# Patient Record
Sex: Female | Born: 1960
Health system: Southern US, Community
[De-identification: ages and names within clinical notes are randomized; demographics above are authoritative.]

## PROBLEM LIST (undated history)

## (undated) ENCOUNTER — Emergency Department (HOSPITAL_COMMUNITY): Payer: Self-pay | Source: Home / Self Care

## (undated) DIAGNOSIS — G629 Polyneuropathy, unspecified: Secondary | ICD-10-CM

## (undated) DIAGNOSIS — C539 Malignant neoplasm of cervix uteri, unspecified: Secondary | ICD-10-CM

## (undated) DIAGNOSIS — F419 Anxiety disorder, unspecified: Secondary | ICD-10-CM

## (undated) DIAGNOSIS — J209 Acute bronchitis, unspecified: Secondary | ICD-10-CM

## (undated) DIAGNOSIS — Z8739 Personal history of other diseases of the musculoskeletal system and connective tissue: Secondary | ICD-10-CM

## (undated) DIAGNOSIS — K8020 Calculus of gallbladder without cholecystitis without obstruction: Secondary | ICD-10-CM

## (undated) DIAGNOSIS — M1990 Unspecified osteoarthritis, unspecified site: Secondary | ICD-10-CM

## (undated) DIAGNOSIS — H269 Unspecified cataract: Secondary | ICD-10-CM

## (undated) DIAGNOSIS — D649 Anemia, unspecified: Secondary | ICD-10-CM

## (undated) DIAGNOSIS — T7840XA Allergy, unspecified, initial encounter: Secondary | ICD-10-CM

## (undated) DIAGNOSIS — I10 Essential (primary) hypertension: Secondary | ICD-10-CM

## (undated) HISTORY — PX: POLYPECTOMY: SHX149

## (undated) HISTORY — DX: Anemia, unspecified: D64.9

## (undated) HISTORY — PX: IR FALLOPIAN TUBE CATHETERIZATION: IMG633

## (undated) HISTORY — DX: Unspecified osteoarthritis, unspecified site: M19.90

## (undated) HISTORY — DX: Polyneuropathy, unspecified: G62.9

## (undated) HISTORY — DX: Acute bronchitis, unspecified: J20.9

## (undated) HISTORY — DX: Allergy, unspecified, initial encounter: T78.40XA

## (undated) HISTORY — DX: Anxiety disorder, unspecified: F41.9

## (undated) HISTORY — PX: COLONOSCOPY: SHX174

## (undated) HISTORY — PX: BREAST EXCISIONAL BIOPSY: SUR124

## (undated) HISTORY — DX: Unspecified cataract: H26.9

---

## 1997-08-09 ENCOUNTER — Emergency Department (HOSPITAL_COMMUNITY): Admission: EM | Admit: 1997-08-09 | Discharge: 1997-08-09 | Payer: Self-pay | Admitting: Emergency Medicine

## 1997-11-17 ENCOUNTER — Emergency Department (HOSPITAL_COMMUNITY): Admission: EM | Admit: 1997-11-17 | Discharge: 1997-11-17 | Payer: Self-pay | Admitting: Emergency Medicine

## 2003-06-28 ENCOUNTER — Emergency Department (HOSPITAL_COMMUNITY): Admission: AD | Admit: 2003-06-28 | Discharge: 2003-06-28 | Payer: Self-pay | Admitting: Family Medicine

## 2004-12-02 ENCOUNTER — Emergency Department (HOSPITAL_COMMUNITY): Admission: EM | Admit: 2004-12-02 | Discharge: 2004-12-02 | Payer: Self-pay | Admitting: Emergency Medicine

## 2005-02-17 ENCOUNTER — Emergency Department (HOSPITAL_COMMUNITY): Admission: EM | Admit: 2005-02-17 | Discharge: 2005-02-17 | Payer: Self-pay | Admitting: Family Medicine

## 2006-10-06 ENCOUNTER — Emergency Department (HOSPITAL_COMMUNITY): Admission: EM | Admit: 2006-10-06 | Discharge: 2006-10-06 | Payer: Self-pay | Admitting: Emergency Medicine

## 2006-11-07 ENCOUNTER — Ambulatory Visit: Payer: Self-pay | Admitting: Internal Medicine

## 2006-11-08 ENCOUNTER — Ambulatory Visit: Payer: Self-pay | Admitting: *Deleted

## 2007-01-05 ENCOUNTER — Emergency Department (HOSPITAL_COMMUNITY): Admission: EM | Admit: 2007-01-05 | Discharge: 2007-01-05 | Payer: Self-pay | Admitting: Family Medicine

## 2008-05-07 ENCOUNTER — Emergency Department (HOSPITAL_COMMUNITY): Admission: EM | Admit: 2008-05-07 | Discharge: 2008-05-07 | Payer: Self-pay | Admitting: Family Medicine

## 2010-04-04 ENCOUNTER — Emergency Department (HOSPITAL_COMMUNITY)
Admission: EM | Admit: 2010-04-04 | Discharge: 2010-04-04 | Payer: Self-pay | Source: Home / Self Care | Admitting: Emergency Medicine

## 2010-12-19 ENCOUNTER — Inpatient Hospital Stay (INDEPENDENT_AMBULATORY_CARE_PROVIDER_SITE_OTHER)
Admission: RE | Admit: 2010-12-19 | Discharge: 2010-12-19 | Disposition: A | Discharge status: Home / Self Care | Payer: Self-pay | Source: Ambulatory Visit | Attending: Family Medicine | Admitting: Family Medicine

## 2010-12-19 DIAGNOSIS — L03019 Cellulitis of unspecified finger: Secondary | ICD-10-CM

## 2010-12-23 LAB — CULTURE, ROUTINE-ABSCESS: Gram Stain: NONE SEEN

## 2011-03-11 ENCOUNTER — Emergency Department (HOSPITAL_COMMUNITY)
Admission: EM | Admit: 2011-03-11 | Discharge: 2011-03-11 | Disposition: A | Discharge status: Home / Self Care | Payer: Self-pay | Attending: Emergency Medicine | Admitting: Emergency Medicine

## 2011-03-11 ENCOUNTER — Encounter: Payer: Self-pay | Admitting: *Deleted

## 2011-03-11 ENCOUNTER — Emergency Department (INDEPENDENT_AMBULATORY_CARE_PROVIDER_SITE_OTHER)
Admission: EM | Admit: 2011-03-11 | Discharge: 2011-03-11 | Disposition: A | Discharge status: Home / Self Care | Payer: Self-pay | Source: Home / Self Care | Attending: Emergency Medicine | Admitting: Emergency Medicine

## 2011-03-11 DIAGNOSIS — F17200 Nicotine dependence, unspecified, uncomplicated: Secondary | ICD-10-CM

## 2011-03-11 DIAGNOSIS — R21 Rash and other nonspecific skin eruption: Secondary | ICD-10-CM | POA: Insufficient documentation

## 2011-03-11 DIAGNOSIS — M25519 Pain in unspecified shoulder: Secondary | ICD-10-CM | POA: Insufficient documentation

## 2011-03-11 DIAGNOSIS — F17210 Nicotine dependence, cigarettes, uncomplicated: Secondary | ICD-10-CM

## 2011-03-11 DIAGNOSIS — Z5320 Procedure and treatment not carried out because of patient's decision for unspecified reasons: Secondary | ICD-10-CM | POA: Insufficient documentation

## 2011-03-11 DIAGNOSIS — L259 Unspecified contact dermatitis, unspecified cause: Secondary | ICD-10-CM

## 2011-03-11 MED ORDER — TRIAMCINOLONE ACETONIDE 0.1 % EX CREA: TOPICAL_CREAM | Freq: Three times a day (TID) | CUTANEOUS | Status: DC

## 2011-03-11 MED ORDER — PREDNISONE 10 MG PO TABS: ORAL_TABLET | ORAL | Status: AC

## 2011-03-11 MED ORDER — METHYLPREDNISOLONE ACETATE PF 80 MG/ML IJ SUSP: 80.0000 mg | Freq: Once | INTRAMUSCULAR | Status: DC

## 2011-03-11 MED ORDER — CEPHALEXIN 500 MG PO CAPS: 500.0000 mg | ORAL_CAPSULE | Freq: Three times a day (TID) | ORAL | Status: AC

## 2011-03-11 NOTE — ED Notes (Signed)
Bumpy, pruritic rash to neck x 4 days w/ progression down upper back; has been applying cool cloths.  C/O gradual onset right shoulder pain over past 3 days; packs boxes for a living.  Also c/o jamming left middle finger 3-4 days ago; continues w/ pain and swelling.  Has been taking IBU for pain.

## 2011-03-11 NOTE — ED Provider Notes (Signed)
History     CSN: [id number] Arrival date & time: 03/11/2011  6:41 PM   First MD Initiated Contact with Patient 03/11/11 1903      Chief Complaint  Patient presents with  . Rash    Right shoulder pain; left middle finger pain    (Consider location/radiation/quality/duration/timing/severity/associated sxs/prior treatment) HPI Comments: Michelle Williams is a 50 year old female who has had a five-day history of a pruritic rash on her neck. She thinks this might be due to exposure to a new ink at work. She works at [employer], making labels for bottles. She thinks it might in her fingernails and then transferred to her neck. The rash itches and burns. She has no rash anywhere else. She denies any fever, chills, nasal congestion, sore throat, or difficulty with breathing.  She is otherwise in good health. Her only medication is ibuprofen. She has no medication allergies. She is postmenopausal. She does not have a primary care Dr. She smokes 3-5 cigarettes per day.  Patient is a 50 y.o. female presenting with rash.  Rash     History reviewed. No pertinent past medical history.  History reviewed. No pertinent past surgical history.  History reviewed. No pertinent family history.  History  Substance Use Topics  . Smoking status: Current Everyday Smoker -- 1.0 packs/day    Types: Cigarettes  . Smokeless tobacco: Not on file  . Alcohol Use: No    OB History    Grav Para Term Preterm Abortions TAB SAB Ect Mult Living                  Review of Systems  Constitutional: Negative for fever and chills.  Skin: Positive for rash. Negative for color change, pallor and wound.    Allergies  Review of patient's allergies indicates no known allergies.  Home Medications   Current Outpatient Rx  Name Route Sig Dispense Refill  . CEPHALEXIN 500 MG PO CAPS Oral Take 1 capsule (500 mg total) by mouth 3 (three) times daily. 30 capsule 0  . PREDNISONE 10 MG PO TABS  Take 4 tabs daily for 4  days, 3 tabs daily for 4 days, 2 tabs daily for 4 days, then 1 tab daily for 4 days.  Take all tabs at one time with food and preferably in the morning except for the first dose. 40 tablet 0  . TRIAMCINOLONE ACETONIDE 0.1 % EX CREA Topical Apply topically 3 (three) times daily. 30 g 0    BP 134/86  Pulse 69  Temp(Src) 98.7 F (37.1 C) (Oral)  Resp 18  SpO2 100%  Physical Exam  Nursing note and vitals reviewed. Constitutional: She appears well-developed and well-nourished. No distress.  Skin: Skin is warm and dry. Rash noted. No abrasion, no bruising, no ecchymosis and no lesion noted. She is not diaphoretic. No erythema. No pallor.       She has a rash on her neck. Some of this looks hyperpigmented and lichenified, especially in the posterior neck, as if it might be a chronic rash due to eczema. In addition to this he has patches and streaks of pustules on both sides of the neck. Some of these are linear. It looks like a contact dermatitis but might be secondarily infected.    ED Course  Procedures (including critical care time)  Labs Reviewed - No data to display No results found.   1. Contact dermatitis   2. Cigarette smoker       MDM  This appears to  be a contact dermatitis possibly with some secondary infection.        Roque Lias, MD 03/11/11 2000

## 2012-02-02 ENCOUNTER — Emergency Department (HOSPITAL_COMMUNITY): Payer: Self-pay

## 2012-02-02 ENCOUNTER — Emergency Department (HOSPITAL_COMMUNITY)
Admission: EM | Admit: 2012-02-02 | Discharge: 2012-02-02 | Disposition: A | Discharge status: Home / Self Care | Payer: Self-pay | Attending: Emergency Medicine | Admitting: Emergency Medicine

## 2012-02-02 ENCOUNTER — Encounter (HOSPITAL_COMMUNITY): Payer: Self-pay | Admitting: *Deleted

## 2012-02-02 DIAGNOSIS — F17200 Nicotine dependence, unspecified, uncomplicated: Secondary | ICD-10-CM | POA: Insufficient documentation

## 2012-02-02 DIAGNOSIS — S52599A Other fractures of lower end of unspecified radius, initial encounter for closed fracture: Secondary | ICD-10-CM | POA: Insufficient documentation

## 2012-02-02 DIAGNOSIS — F411 Generalized anxiety disorder: Secondary | ICD-10-CM | POA: Insufficient documentation

## 2012-02-02 DIAGNOSIS — S52501A Unspecified fracture of the lower end of right radius, initial encounter for closed fracture: Secondary | ICD-10-CM

## 2012-02-02 DIAGNOSIS — R209 Unspecified disturbances of skin sensation: Secondary | ICD-10-CM | POA: Insufficient documentation

## 2012-02-02 MED ORDER — LORAZEPAM 2 MG/ML IJ SOLN
1.0000 mg | Freq: Once | INTRAMUSCULAR | Status: AC
  Administered 2012-02-02: 1 mg via INTRAVENOUS
  Filled 2012-02-02: qty 1

## 2012-02-02 MED ORDER — HYDROMORPHONE HCL PF 1 MG/ML IJ SOLN
1.0000 mg | Freq: Once | INTRAMUSCULAR | Status: AC
  Administered 2012-02-02: 1 mg via INTRAVENOUS
  Filled 2012-02-02: qty 1

## 2012-02-02 NOTE — Progress Notes (Signed)
Orthopedic Tech Progress Note Patient Details:  Michelle Williams July 07, 1960 [id number]  Ortho Devices Type of Ortho Device: Sugartong splint;Arm foam sling Ortho Device/Splint Location: right arm Ortho Device/Splint Interventions: Application   Poonam Woehrle 02/02/2012, 9:15 PM

## 2012-02-02 NOTE — Progress Notes (Signed)
Orthopedic Tech Progress Note Patient Details:  Michelle Williams 1961/03/28 [id number]  Patient ID: [name], female   DOB: 1960-09-25, 51 y.o.   MRN: [id number]   [name] 02/02/2012, 9:15 PM
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
## 2012-02-02 NOTE — ED Notes (Signed)
Paged ortho tech for placement of Splint
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
## 2012-02-02 NOTE — ED Notes (Signed)
Orthopedic Surgeon at bedside for reduction of right splint. Ortho tech at bedside with MD

## 2012-02-02 NOTE — ED Provider Notes (Signed)
Medical screening examination/treatment/procedure(s) were performed by non-physician practitioner and as supervising physician I was immediately available for consultation/collaboration.   David H Yao, MD 02/02/12 2324 

## 2012-08-01 ENCOUNTER — Emergency Department (HOSPITAL_COMMUNITY)
Admission: EM | Admit: 2012-08-01 | Discharge: 2012-08-01 | Disposition: A | Discharge status: Home / Self Care | Payer: Self-pay | Attending: Emergency Medicine | Admitting: Emergency Medicine

## 2012-08-01 ENCOUNTER — Emergency Department (HOSPITAL_COMMUNITY): Payer: Self-pay

## 2012-08-01 ENCOUNTER — Encounter (HOSPITAL_COMMUNITY): Payer: Self-pay | Admitting: Emergency Medicine

## 2012-08-01 DIAGNOSIS — F17200 Nicotine dependence, unspecified, uncomplicated: Secondary | ICD-10-CM | POA: Insufficient documentation

## 2012-08-01 DIAGNOSIS — Z3202 Encounter for pregnancy test, result negative: Secondary | ICD-10-CM | POA: Insufficient documentation

## 2012-08-01 DIAGNOSIS — R109 Unspecified abdominal pain: Secondary | ICD-10-CM | POA: Insufficient documentation

## 2012-08-01 DIAGNOSIS — R42 Dizziness and giddiness: Secondary | ICD-10-CM | POA: Insufficient documentation

## 2012-08-01 LAB — URINALYSIS, ROUTINE W REFLEX MICROSCOPIC
Bilirubin Urine: NEGATIVE
Glucose, UA: NEGATIVE mg/dL
Specific Gravity, Urine: 1.016 (ref 1.005–1.030)
Urobilinogen, UA: 1 mg/dL (ref 0.0–1.0)

## 2012-08-01 LAB — COMPREHENSIVE METABOLIC PANEL
BUN: 8 mg/dL (ref 6–23)
Calcium: 9.4 mg/dL (ref 8.4–10.5)
Chloride: 103 mEq/L (ref 96–112)
Creatinine, Ser: 0.81 mg/dL (ref 0.50–1.10)
Glucose, Bld: 119 mg/dL — ABNORMAL HIGH (ref 70–99)
Potassium: 3.7 mEq/L (ref 3.5–5.1)
Sodium: 138 mEq/L (ref 135–145)
Total Bilirubin: 0.5 mg/dL (ref 0.3–1.2)
Total Protein: 7.9 g/dL (ref 6.0–8.3)

## 2012-08-01 LAB — CBC WITH DIFFERENTIAL/PLATELET
Basophils Absolute: 0 10*3/uL (ref 0.0–0.1)
Eosinophils Relative: 0 % (ref 0–5)
HCT: 40 % (ref 36.0–46.0)
Hemoglobin: 13.6 g/dL (ref 12.0–15.0)
Lymphs Abs: 1.2 10*3/uL (ref 0.7–4.0)
MCH: 31.1 pg (ref 26.0–34.0)
MCV: 91.3 fL (ref 78.0–100.0)
Platelets: 212 10*3/uL (ref 150–400)
WBC: 12.9 10*3/uL — ABNORMAL HIGH (ref 4.0–10.5)

## 2012-08-01 MED ORDER — LEVOFLOXACIN 500 MG PO TABS
500.0000 mg | ORAL_TABLET | Freq: Every day | ORAL | Status: DC
  Administered 2012-08-01: 500 mg via ORAL
  Filled 2012-08-01: qty 1

## 2012-08-01 MED ORDER — SODIUM CHLORIDE 0.9 % IV SOLN
1000.0000 mL | Freq: Once | INTRAVENOUS | Status: AC
  Administered 2012-08-01: 1000 mL via INTRAVENOUS

## 2012-08-01 MED ORDER — IOHEXOL 300 MG/ML  SOLN
100.0000 mL | Freq: Once | INTRAMUSCULAR | Status: AC | PRN
  Administered 2012-08-01: 100 mL via INTRAVENOUS

## 2012-08-01 MED ORDER — HYDROCODONE-ACETAMINOPHEN 5-325 MG PO TABS: 1.0000 | ORAL_TABLET | Freq: Four times a day (QID) | ORAL | Status: DC | PRN

## 2012-08-01 MED ORDER — HYDROMORPHONE HCL PF 1 MG/ML IJ SOLN
0.5000 mg | Freq: Once | INTRAMUSCULAR | Status: AC
  Administered 2012-08-01: 0.5 mg via INTRAVENOUS
  Filled 2012-08-01: qty 1

## 2012-08-01 MED ORDER — LEVOFLOXACIN 500 MG PO TABS: 500.0000 mg | ORAL_TABLET | Freq: Every day | ORAL | Status: DC

## 2012-08-01 MED ORDER — METRONIDAZOLE 500 MG PO TABS: 500.0000 mg | ORAL_TABLET | Freq: Three times a day (TID) | ORAL | Status: DC

## 2012-08-01 MED ORDER — IOHEXOL 300 MG/ML  SOLN
25.0000 mL | INTRAMUSCULAR | Status: AC
  Administered 2012-08-01: 25 mL via ORAL

## 2012-08-01 MED ORDER — ONDANSETRON HCL 4 MG/2ML IJ SOLN
4.0000 mg | Freq: Once | INTRAMUSCULAR | Status: AC
  Administered 2012-08-01: 4 mg via INTRAVENOUS
  Filled 2012-08-01: qty 2

## 2012-08-01 MED ORDER — DEXTROSE 5 % IV SOLN: 1.0000 g | Freq: Once | INTRAVENOUS | Status: DC

## 2012-08-01 MED ORDER — METRONIDAZOLE 500 MG PO TABS
500.0000 mg | ORAL_TABLET | Freq: Once | ORAL | Status: AC
  Administered 2012-08-01: 500 mg via ORAL
  Filled 2012-08-01: qty 1

## 2012-08-01 NOTE — ED Notes (Signed)
Pt unable to give urine sample for specimen at this time.

## 2012-08-01 NOTE — ED Notes (Signed)
Per pt, pt reporting pain in lower quadrants of abdomen. Pt states the pain started yesterday and woke her out of her sleep. Pt denies burning when urinating, denies urinary frequency, denies hematuria. Pt states she is having slight dizziness with symptoms.

## 2012-08-01 NOTE — ED Notes (Signed)
Pt complaining of mostly lower abdominal pain near bladder. Pt also reports lower back pain in the flank area.

## 2012-08-01 NOTE — ED Notes (Signed)
Patient given oral contrast to patient. Verbalized understanding

## 2012-08-01 NOTE — ED Notes (Signed)
EDP at bedside assessing pt.  

## 2012-08-01 NOTE — ED Provider Notes (Signed)
History     CSN: [id number]  Arrival date & time 08/01/12  0846   First MD Initiated Contact with Patient 08/01/12 [id number]      Chief Complaint  Patient presents with  . Abdominal Pain    (Consider location/radiation/quality/duration/timing/severity/associated sxs/prior treatment) HPI Patient presented with abdominal pain.  Pain began yesterday, without clear precipitant.  Since onset there's been pain throughout the lower abdomen, greater on the left.  Mild relief with ibuprofen, no clear exacerbating factors. There is no associated hematuria, dysuria, vaginal complaints. There is mild associated dizziness, but no syncope, no chest pain, no dyspnea. She denies a history of abdominal surgery, or any chronic conditions for which she takes medication.  History reviewed. No pertinent past medical history.  History reviewed. No pertinent past surgical history.  No family history on file.  History  Substance Use Topics  . Smoking status: Current Every Day Smoker -- 1.00 packs/day    Types: Cigarettes  . Smokeless tobacco: Not on file  . Alcohol Use: Yes    OB History   Grav Para Term Preterm Abortions TAB SAB Ect Mult Living                  Review of Systems  Constitutional:       Per HPI, otherwise negative  HENT:       Per HPI, otherwise negative  Respiratory:       Per HPI, otherwise negative  Cardiovascular:       Per HPI, otherwise negative  Gastrointestinal: Positive for abdominal pain. Negative for vomiting and abdominal distention.  Endocrine:       Negative aside from HPI  Genitourinary:       Neg aside from HPI   Musculoskeletal:       Per HPI, otherwise negative  Skin: Negative.   Neurological: Positive for light-headedness. Negative for syncope, weakness and headaches.    Allergies  Review of patient's allergies indicates no known allergies.  Home Medications   Current Outpatient Rx  Name  Route  Sig  Dispense  Refill  . ibuprofen (ADVIL,MOTRIN)  800 MG tablet   Oral   Take 800 mg by mouth every 8 (eight) hours as needed for pain.           BP 122/89  Pulse 96  Temp(Src) 98.4 F (36.9 C) (Oral)  Resp 20  SpO2 97%  Physical Exam  Nursing note and vitals reviewed. Constitutional: She is oriented to person, place, and time. She appears well-developed and well-nourished. No distress.  HENT:  Head: Normocephalic and atraumatic.  Eyes: Conjunctivae and EOM are normal.  Cardiovascular: Normal rate and regular rhythm.   Pulmonary/Chest: Effort normal and breath sounds normal. No stridor. No respiratory distress.  Abdominal: She exhibits no distension. There is no hepatosplenomegaly. There is tenderness in the right lower quadrant, suprapubic area and left lower quadrant. There is no rigidity, no rebound, no guarding and no CVA tenderness.  Pain in RLQ>LLQ  Musculoskeletal: She exhibits no edema.  Neurological: She is alert and oriented to person, place, and time. No cranial nerve deficit.  Skin: Skin is warm and dry.  Psychiatric: She has a normal mood and affect.    ED Course  Procedures (including critical care time)  Labs Reviewed  CBC WITH DIFFERENTIAL - Abnormal; Notable for the following:    WBC 12.9 (*)    Neutrophils Relative 86 (*)    Neutro Abs 11.1 (*)    Lymphocytes Relative 9 (*)  All other components within normal limits  URINALYSIS, ROUTINE W REFLEX MICROSCOPIC  COMPREHENSIVE METABOLIC PANEL  LIPASE, BLOOD  PREGNANCY, URINE   No results found.   No diagnosis found.  1:16 PM Patient just receiving oral contrast  Initial labs notable for leukocytosis.  4:21 PM I reviewed the CT results and labs with the patient.  She requests discharge.  Although the CT recommends additional imaging, she is in no distress, and we discussed her request to leave, as well as the risks and benefits of doing so prior to completing additional evaluation.  She still requests to leave, and was counseled on the need to  follow up for concerning changes, as well as the need for additional evaluation, [facility] with [facility].  She will receive additional antibiotics for presumed TOA  MDM  Patient presents with new abdominal pain.  On exam she is in no distress, though she appears uncomfortable and has tenderness to palpation about the lower abdomen.  She is afebrile but there is mild leukocytosis.  CT scan suggests early infection, possibly TOA.  Patient prefers temperature rather than complete evaluation, and after counseling on the risks and benefits, with her capacity to make this decision she was allowed leave.        Gerhard Munch, MD 08/01/12 1622

## 2012-08-01 NOTE — ED Notes (Signed)
Pharmacy tech at bedside 

## 2012-08-01 NOTE — ED Notes (Signed)
Notified CT of negative pregnancy test.

## 2014-03-13 ENCOUNTER — Emergency Department: Payer: Self-pay | Admitting: Emergency Medicine

## 2014-03-13 LAB — BASIC METABOLIC PANEL
Anion Gap: 8 (ref 7–16)
BUN: 7 mg/dL (ref 7–18)
CALCIUM: 8.5 mg/dL (ref 8.5–10.1)
Chloride: 109 mmol/L — ABNORMAL HIGH (ref 98–107)
Co2: 22 mmol/L (ref 21–32)
Creatinine: 0.82 mg/dL (ref 0.60–1.30)
EGFR (Non-African Amer.): >60
GLUCOSE: 122 mg/dL — AB (ref 65–99)
Osmolality: 277 (ref 275–301)
Potassium: 4.2 mmol/L (ref 3.5–5.1)
Sodium: 139 mmol/L (ref 136–145)

## 2014-03-13 LAB — CBC
HCT: 40.4 % (ref 35.0–47.0)
HGB: 13.5 g/dL (ref 12.0–16.0)
MCH: 31.9 pg (ref 26.0–34.0)
MCHC: 33.3 g/dL (ref 32.0–36.0)
MCV: 96 fL (ref 80–100)
Platelet: 253 10*3/uL (ref 150–440)
RBC: 4.22 10*6/uL (ref 3.80–5.20)
RDW: 13.3 % (ref 11.5–14.5)
WBC: 6.7 10*3/uL (ref 3.6–11.0)

## 2014-03-13 LAB — TROPONIN I

## 2014-03-23 ENCOUNTER — Emergency Department: Payer: Self-pay | Admitting: Emergency Medicine

## 2014-04-07 ENCOUNTER — Emergency Department: Payer: Self-pay | Admitting: Student

## 2014-05-09 DIAGNOSIS — M5030 Other cervical disc degeneration, unspecified cervical region: Secondary | ICD-10-CM | POA: Insufficient documentation

## 2014-05-09 DIAGNOSIS — G894 Chronic pain syndrome: Secondary | ICD-10-CM | POA: Insufficient documentation

## 2014-05-09 DIAGNOSIS — F419 Anxiety disorder, unspecified: Secondary | ICD-10-CM | POA: Insufficient documentation

## 2014-11-16 ENCOUNTER — Encounter: Payer: Self-pay | Admitting: Emergency Medicine

## 2014-11-16 ENCOUNTER — Emergency Department
Admission: EM | Admit: 2014-11-16 | Discharge: 2014-11-16 | Disposition: A | Discharge status: Home / Self Care | Payer: Self-pay | Attending: Emergency Medicine | Admitting: Emergency Medicine

## 2014-11-16 DIAGNOSIS — R600 Localized edema: Secondary | ICD-10-CM | POA: Insufficient documentation

## 2014-11-16 DIAGNOSIS — R609 Edema, unspecified: Secondary | ICD-10-CM

## 2014-11-16 DIAGNOSIS — Z79899 Other long term (current) drug therapy: Secondary | ICD-10-CM | POA: Insufficient documentation

## 2014-11-16 DIAGNOSIS — Z720 Tobacco use: Secondary | ICD-10-CM | POA: Insufficient documentation

## 2014-11-16 DIAGNOSIS — G5602 Carpal tunnel syndrome, left upper limb: Secondary | ICD-10-CM | POA: Insufficient documentation

## 2014-11-16 HISTORY — DX: Personal history of other diseases of the musculoskeletal system and connective tissue: Z87.39

## 2014-11-16 LAB — COMPREHENSIVE METABOLIC PANEL
ALBUMIN: 4.2 g/dL (ref 3.5–5.0)
ALK PHOS: 66 U/L (ref 38–126)
ALT: 19 U/L (ref 14–54)
ANION GAP: 5 (ref 5–15)
AST: 23 U/L (ref 15–41)
BILIRUBIN TOTAL: 0.4 mg/dL (ref 0.3–1.2)
BUN: 12 mg/dL (ref 6–20)
CHLORIDE: 108 mmol/L (ref 101–111)
CO2: 26 mmol/L (ref 22–32)
CREATININE: 0.72 mg/dL (ref 0.44–1.00)
Calcium: 8.7 mg/dL — ABNORMAL LOW (ref 8.9–10.3)
GFR calc non Af Amer: >60 mL/min (ref >60)
GLUCOSE: 93 mg/dL (ref 65–99)
POTASSIUM: 3.5 mmol/L (ref 3.5–5.1)
Sodium: 139 mmol/L (ref 135–145)
Total Protein: 7.1 g/dL (ref 6.5–8.1)

## 2014-11-16 LAB — CBC
HCT: 37.3 % (ref 35.0–47.0)
HEMOGLOBIN: 12.6 g/dL (ref 12.0–16.0)
MCH: 31.4 pg (ref 26.0–34.0)
MCHC: 33.9 g/dL (ref 32.0–36.0)
MCV: 92.7 fL (ref 80.0–100.0)
Platelets: 265 10*3/uL (ref 150–440)
RBC: 4.02 MIL/uL (ref 3.80–5.20)
RDW: 13.5 % (ref 11.5–14.5)
WBC: 6.5 10*3/uL (ref 3.6–11.0)

## 2014-11-16 NOTE — ED Notes (Signed)
Pt reports left sided numbness and tingling. Reports burning sensation in arms. Pt also reports bilateral feet swelling. Has been ongoing for 3 weeks. No slurred speech.  Able to ambulate.

## 2014-11-16 NOTE — ED Provider Notes (Signed)
[facility] Emergency Department Provider Note  ____________________________________________  Time seen: On arrival  I have reviewed the triage vital signs and the nursing notes.   HISTORY  Chief Complaint Numbness and Foot Swelling    HPI Michelle Williams is a 54 y.o. female who presents with complaints of bilateral lower extremity swelling that is mild for several weeksas well as tingling in her left hand especially in the middle of the night. Patient reports she works at [employer] and uses her left hand repeatedly. She frequently wakes up in the night and feels tingling in her left hand. She has no muscle weakness. She has no neck pain. No headache. She has never had swelling in her legs before but denies any long trips or recent surgery. She has no pain or redness     Past Medical History  Diagnosis Date  . H/O degenerative disc disease   . H/O rheumatoid arthritis     There are no active problems to display for this patient.   No past surgical history on file.  Current Outpatient Rx  Name  Route  Sig  Dispense  Refill  . HYDROcodone-acetaminophen (NORCO/VICODIN) 5-325 MG per tablet   Oral   Take 1 tablet by mouth every 6 (six) hours as needed for pain.   10 tablet   0   . ibuprofen (ADVIL,MOTRIN) 800 MG tablet   Oral   Take 800 mg by mouth every 8 (eight) hours as needed for pain.         Marland Kitchen levofloxacin (LEVAQUIN) 500 MG tablet   Oral   Take 1 tablet (500 mg total) by mouth daily.   10 tablet   0   . metroNIDAZOLE (FLAGYL) 500 MG tablet   Oral   Take 1 tablet (500 mg total) by mouth 3 (three) times daily.   30 tablet   0     Allergies Review of patient's allergies indicates no known allergies.  No family history on file.  Social History History  Substance Use Topics  . Smoking status: Current Every Day Smoker -- 1.00 packs/day    Types: Cigarettes  . Smokeless tobacco: Not on file  . Alcohol Use: Yes    Review of  Systems  Constitutional: Negative for fever. Eyes: Negative for visual changes. ENT: Negative for sore throat Cardiovascular: Negative for chest pain. Respiratory: Negative for shortness of breath. Gastrointestinal: Negative for abdominal pain, vomiting and diarrhea. Genitourinary: Negative for dysuria. Musculoskeletal: Negative for back pain. Skin: Negative for rash. Neurological: Negative for headaches or focal weakness. Tingling in left hand occasionally at night Psychiatric no anxiety  10-point ROS otherwise negative.  ____________________________________________   PHYSICAL EXAM:  VITAL SIGNS: ED Triage Vitals  Enc Vitals Group     BP 11/16/14 1737 190/103 mmHg     Pulse Rate 11/16/14 1737 69     Resp 11/16/14 1737 18     Temp 11/16/14 1737 98.2 F (36.8 C)     Temp Source 11/16/14 1737 Oral     SpO2 11/16/14 1737 96 %     Weight 11/16/14 1737 160 lb (72.576 kg)     Height 11/16/14 1737 5\' 4"  (1.626 m)     Head Cir --      Peak Flow --      Pain Score 11/16/14 1740 8     Pain Loc --      Pain Edu? --      Excl. in [place]? --  Constitutional: Alert and oriented. Well appearing and in no distress. Eyes: Conjunctivae are normal.  ENT   Head: Normocephalic and atraumatic.   Mouth/Throat: Mucous membranes are moist. Cardiovascular: Normal rate, regular rhythm. Normal and symmetric distal pulses are present in all extremities. No murmurs, rubs, or gallops. Respiratory: Normal respiratory effort without tachypnea nor retractions. Breath sounds are clear and equal bilaterally.  Gastrointestinal: Soft and non-tender in all quadrants. No distention. There is no CVA tenderness. Genitourinary: deferred Musculoskeletal: Nontender with normal range of motion in all extremities. Normal Strength in the left hand and arm. No palpable edema felt in the lower extremities Neurologic:  Normal speech and language. No gross focal neurologic deficits are appreciated. Skin:   Skin is warm, dry and intact. No rash noted. Psychiatric: Mood and affect are normal. Patient exhibits appropriate insight and judgment.  ____________________________________________    LABS (pertinent positives/negatives)  Labs Reviewed  CBC  COMPREHENSIVE METABOLIC PANEL    ____________________________________________   EKG  None  ____________________________________________    RADIOLOGY I have personally reviewed any xrays that were ordered on this patient: None  ____________________________________________   PROCEDURES  Procedure(s) performed: none  Critical Care performed: none  ____________________________________________   INITIAL IMPRESSION / ASSESSMENT AND PLAN / ED COURSE  Pertinent labs & imaging results that were available during my care of the patient were reviewed by me and considered in my medical decision making (see chart for details).  Patient well-appearing. Suspect possible carpal tunnel syndrome given repetitive movement at work and nighttime symptoms. We will provide splint and refer to hand surgery as needed. No significant swelling noted of the extremities, her symptoms may be related to salt intake and heat and humidity currently. We will have her follow-up with her PCP  ____________________________________________   FINAL CLINICAL IMPRESSION(S) / ED DIAGNOSES  Final diagnoses:  Carpal tunnel syndrome of left wrist  Peripheral edema     Lavonia Drafts, MD 11/16/14 2309

## 2014-11-16 NOTE — Discharge Instructions (Signed)
Carpal Tunnel Syndrome The carpal tunnel is a narrow area located on the palm side of your wrist. The tunnel is formed by the wrist bones and ligaments. Nerves, blood vessels, and tendons pass through the carpal tunnel. Repeated wrist motion or certain diseases may cause swelling within the tunnel. This swelling pinches the main nerve in the wrist (median nerve) and causes the painful hand and arm condition called carpal tunnel syndrome. CAUSES   Repeated wrist motions.  Wrist injuries.  Certain diseases like arthritis, diabetes, alcoholism, hyperthyroidism, and kidney failure.  Obesity.  Pregnancy. SYMPTOMS   A "pins and needles" feeling in your fingers or hand, especially in your thumb, index and middle fingers.  Tingling or numbness in your fingers or hand.  An aching feeling in your entire arm, especially when your wrist and elbow are bent for long periods of time.  Wrist pain that goes up your arm to your shoulder.  Pain that goes down into your palm or fingers.  A weak feeling in your hands. DIAGNOSIS  Your health care provider will take your history and perform a physical exam. An electromyography test may be needed. This test measures electrical signals sent out by your nerves into the muscles. The electrical signals are usually slowed by carpal tunnel syndrome. You may also need X-rays. TREATMENT  Carpal tunnel syndrome may clear up by itself. Your health care provider may recommend a wrist splint or medicine such as a nonsteroidal anti-inflammatory medicine. Cortisone injections may help. Sometimes, surgery may be needed to free the pinched nerve.  HOME CARE INSTRUCTIONS   Take all medicine as directed by your health care provider. Only take over-the-counter or prescription medicines for pain, discomfort, or fever as directed by your health care provider.  If you were given a splint to keep your wrist from bending, wear it as directed. It is important to wear the splint at  night. Wear the splint for as long as you have pain or numbness in your hand, arm, or wrist. This may take 1 to 2 months.  Rest your wrist from any activity that may be causing your pain. If your symptoms are work-related, you may need to talk to your employer about changing to a job that does not require using your wrist.  Put ice on your wrist after long periods of wrist activity.  Put ice in a plastic bag.  Place a towel between your skin and the bag.  Leave the ice on for 15-20 minutes, 03-04 times a day.  Keep all follow-up visits as directed by your health care provider. This includes any orthopedic referrals, physical therapy, and rehabilitation. Any delay in getting necessary care could result in a delay or failure of your condition to heal. SEEK IMMEDIATE MEDICAL CARE IF:   You have new, unexplained symptoms.  Your symptoms get worse and are not helped or controlled with medicines. MAKE SURE YOU:   Understand these instructions.  Will watch your condition.  Will get help right away if you are not doing well or get worse. Document Released: 04/15/2000 Document Revised: 09/02/2013 Document Reviewed: 03/04/2011 [facility] Patient Information 2015 [place], [place]. This information is not intended to replace advice given to you by your health care provider. Make sure you discuss any questions you have with your health care provider.  Edema Edema is an abnormal buildup of fluids in your bodytissues. Edema is somewhatdependent on gravity to pull the fluid to the lowest place in your body. That makes the condition more  common in the legs and thighs (lower extremities). Painless swelling of the feet and ankles is common and becomes more likely as you get older. It is also common in looser tissues, like around your eyes.  When the affected area is squeezed, the fluid may move out of that spot and leave a dent for a few moments. This dent is called pitting.  CAUSES  There are many  possible causes of edema. Eating too much salt and being on your feet or sitting for a long time can cause edema in your legs and ankles. Hot weather may make edema worse. Common medical causes of edema include:  Heart failure.  Liver disease.  Kidney disease.  Weak blood vessels in your legs.  Cancer.  An injury.  Pregnancy.  Some medications.  Obesity. SYMPTOMS  Edema is usually painless.Your skin may look swollen or shiny.  DIAGNOSIS  Your health care provider may be able to diagnose edema by asking about your medical history and doing a physical exam. You may need to have tests such as X-rays, an electrocardiogram, or blood tests to check for medical conditions that may cause edema.  TREATMENT  Edema treatment depends on the cause. If you have heart, liver, or kidney disease, you need the treatment appropriate for these conditions. General treatment may include:  Elevation of the affected body part above the level of your heart.  Compression of the affected body part. Pressure from elastic bandages or support stockings squeezes the tissues and forces fluid back into the blood vessels. This keeps fluid from entering the tissues.  Restriction of fluid and salt intake.  Use of a water pill (diuretic). These medications are appropriate only for some types of edema. They pull fluid out of your body and make you urinate more often. This gets rid of fluid and reduces swelling, but diuretics can have side effects. Only use diuretics as directed by your health care provider. HOME CARE INSTRUCTIONS   Keep the affected body part above the level of your heart when you are lying down.   Do not sit still or stand for prolonged periods.   Do not put anything directly under your knees when lying down.  Do not wear constricting clothing or garters on your upper legs.   Exercise your legs to work the fluid back into your blood vessels. This may help the swelling go down.   Wear  elastic bandages or support stockings to reduce ankle swelling as directed by your health care provider.   Eat a low-salt diet to reduce fluid if your health care provider recommends it.   Only take medicines as directed by your health care provider. SEEK MEDICAL CARE IF:   Your edema is not responding to treatment.  You have heart, liver, or kidney disease and notice symptoms of edema.  You have edema in your legs that does not improve after elevating them.   You have sudden and unexplained weight gain. SEEK IMMEDIATE MEDICAL CARE IF:   You develop shortness of breath or chest pain.   You cannot breathe when you lie down.  You develop pain, redness, or warmth in the swollen areas.   You have heart, liver, or kidney disease and suddenly get edema.  You have a fever and your symptoms suddenly get worse. MAKE SURE YOU:   Understand these instructions.  Will watch your condition.  Will get help right away if you are not doing well or get worse. Document Released: 04/18/2005 Document Revised: 09/02/2013  Document Reviewed: 02/08/2013 [facility] Patient Information 2015 [place]. This information is not intended to replace advice given to you by your health care provider. Make sure you discuss any questions you have with your health care provider.

## 2014-12-11 ENCOUNTER — Ambulatory Visit (HOSPITAL_COMMUNITY)
Admission: RE | Admit: 2014-12-11 | Discharge: 2014-12-11 | Disposition: A | Discharge status: Home / Self Care | Payer: Self-pay | Source: Ambulatory Visit | Attending: Internal Medicine | Admitting: Internal Medicine

## 2014-12-11 ENCOUNTER — Emergency Department (HOSPITAL_COMMUNITY)
Admission: EM | Admit: 2014-12-11 | Discharge: 2014-12-11 | Disposition: A | Discharge status: Other Acute Inpatient Hospital | Payer: Self-pay

## 2014-12-11 ENCOUNTER — Other Ambulatory Visit (HOSPITAL_COMMUNITY): Payer: Self-pay | Admitting: Internal Medicine

## 2014-12-11 DIAGNOSIS — R52 Pain, unspecified: Secondary | ICD-10-CM

## 2014-12-11 DIAGNOSIS — M7989 Other specified soft tissue disorders: Secondary | ICD-10-CM | POA: Insufficient documentation

## 2015-02-19 ENCOUNTER — Emergency Department (HOSPITAL_COMMUNITY)
Admission: EM | Admit: 2015-02-19 | Discharge: 2015-02-19 | Disposition: A | Discharge status: Home / Self Care | Payer: Self-pay | Attending: Physician Assistant | Admitting: Physician Assistant

## 2015-02-19 ENCOUNTER — Emergency Department (HOSPITAL_COMMUNITY): Payer: Self-pay

## 2015-02-19 ENCOUNTER — Encounter (HOSPITAL_COMMUNITY): Payer: Self-pay | Admitting: Neurology

## 2015-02-19 DIAGNOSIS — M545 Low back pain, unspecified: Secondary | ICD-10-CM

## 2015-02-19 DIAGNOSIS — M5136 Other intervertebral disc degeneration, lumbar region: Secondary | ICD-10-CM | POA: Insufficient documentation

## 2015-02-19 DIAGNOSIS — I10 Essential (primary) hypertension: Secondary | ICD-10-CM | POA: Insufficient documentation

## 2015-02-19 DIAGNOSIS — Z720 Tobacco use: Secondary | ICD-10-CM | POA: Insufficient documentation

## 2015-02-19 DIAGNOSIS — Z792 Long term (current) use of antibiotics: Secondary | ICD-10-CM | POA: Insufficient documentation

## 2015-02-19 HISTORY — DX: Essential (primary) hypertension: I10

## 2015-02-19 LAB — URINALYSIS, ROUTINE W REFLEX MICROSCOPIC
BILIRUBIN URINE: NEGATIVE
Glucose, UA: NEGATIVE mg/dL
HGB URINE DIPSTICK: NEGATIVE
Ketones, ur: NEGATIVE mg/dL
Leukocytes, UA: NEGATIVE
NITRITE: NEGATIVE
PH: 7 (ref 5.0–8.0)
Protein, ur: NEGATIVE mg/dL
SPECIFIC GRAVITY, URINE: 1.025 (ref 1.005–1.030)
Urobilinogen, UA: 1 mg/dL (ref 0.0–1.0)

## 2015-02-19 MED ORDER — TRAMADOL HCL 50 MG PO TABS: 50.0000 mg | ORAL_TABLET | Freq: Four times a day (QID) | ORAL | Status: DC | PRN

## 2015-02-19 MED ORDER — KETOROLAC TROMETHAMINE 60 MG/2ML IM SOLN
60.0000 mg | Freq: Once | INTRAMUSCULAR | Status: AC
  Administered 2015-02-19: 60 mg via INTRAMUSCULAR
  Filled 2015-02-19: qty 2

## 2015-02-19 MED ORDER — CYCLOBENZAPRINE HCL 10 MG PO TABS: 5.0000 mg | ORAL_TABLET | Freq: Two times a day (BID) | ORAL | Status: DC | PRN

## 2015-02-19 NOTE — Discharge Instructions (Signed)
Degenerative Disk Disease Degenerative disk disease is a condition caused by the changes that occur in spinal disks as you grow older. Spinal disks are soft and compressible disks located between the bones of your spine (vertebrae). These disks act like shock absorbers. Degenerative disk disease can affect the whole spine. However, the neck and lower back are most commonly affected. Many changes can occur in the spinal disks with aging, such as:  The spinal disks may dry and shrink.  Small tears may occur in the tough, outer covering of the disk (annulus).  The disk space may become smaller due to loss of water.  Abnormal growths in the bone (spurs) may occur. This can put pressure on the nerve roots exiting the spinal canal, causing pain.  The spinal canal may become narrowed. RISK FACTORS   Being overweight.  Having a family history of degenerative disk disease.  Smoking.  There is increased risk if you are doing heavy lifting or have a sudden injury. SIGNS AND SYMPTOMS  Symptoms vary from person to person and may include:  Pain that varies in intensity. Some people have no pain, while others have severe pain. The location of the pain depends on the part of your backbone that is affected.  You will have neck or arm pain if a disk in the neck area is affected.  You will have pain in your back, buttocks, or legs if a disk in the lower back is affected.  Pain that becomes worse while bending, reaching up, or with twisting movements.  Pain that may start gradually and then get worse as time passes. It may also start after a major or minor injury.  Numbness or tingling in the arms or legs. DIAGNOSIS  Your health care provider will ask you about your symptoms and about activities or habits that may cause the pain. He or she may also ask about any injuries, diseases, or treatments you have had. Your health care provider will examine you to check for the range of movement that is  possible in the affected area, to check for strength in your extremities, and to check for sensation in the areas of the arms and legs supplied by different nerve roots. You may also have:   An X-ray of the spine.  Other imaging tests, such as MRI. TREATMENT  Your health care provider will advise you on the best plan for treatment. Treatment may include:  Medicines.  Rehabilitation exercises. HOME CARE INSTRUCTIONS   Follow proper lifting and walking techniques as advised by your health care provider.  Maintain good posture.  Exercise regularly as advised by your health care provider.  Perform relaxation exercises.  Change your sitting, standing, and sleeping habits as advised by your health care provider.  Change positions frequently.  Lose weight or maintain a healthy weight as advised by your health care provider.  Do not use any tobacco products, including cigarettes, chewing tobacco, or electronic cigarettes. If you need help quitting, ask your health care provider.  Wear supportive footwear.  Take medicines only as directed by your health care provider. SEEK MEDICAL CARE IF:   Your pain does not go away within 1-4 weeks.  You have significant appetite or weight loss. SEEK IMMEDIATE MEDICAL CARE IF:   Your pain is severe.  You notice weakness in your arms, hands, or legs.  You begin to lose control of your bladder or bowel movements.  You have fevers or night sweats. MAKE SURE YOU:   Understand these  instructions.  Will watch your condition.  Will get help right away if you are not doing well or get worse.   This information is not intended to replace advice given to you by your health care provider. Make sure you discuss any questions you have with your health care provider.   Document Released: 02/13/2007 Document Revised: 05/09/2014 Document Reviewed: 08/20/2013 Elsevier Interactive Patient Education 2016 [employer]  Back Pain, Adult Back pain  is very common in adults.The cause of back pain is rarely dangerous and the pain often gets better over time.The cause of your back pain may not be known. Some common causes of back pain include:  Strain of the muscles or ligaments supporting the spine.  Wear and tear (degeneration) of the spinal disks.  Arthritis.  Direct injury to the back. For many people, back pain may return. Since back pain is rarely dangerous, most people can learn to manage this condition on their own. HOME CARE INSTRUCTIONS Watch your back pain for any changes. The following actions may help to lessen any discomfort you are feeling:  Remain active. It is stressful on your back to sit or stand in one place for long periods of time. Do not sit, drive, or stand in one place for more than 30 minutes at a time. Take short walks on even surfaces as soon as you are able.Try to increase the length of time you walk each day.  Exercise regularly as directed by your health care provider. Exercise helps your back heal faster. It also helps avoid future injury by keeping your muscles strong and flexible.  Do not stay in bed.Resting more than 1-2 days can delay your recovery.  Pay attention to your body when you bend and lift. The most comfortable positions are those that put less stress on your recovering back. Always use proper lifting techniques, including:  Bending your knees.  Keeping the load close to your body.  Avoiding twisting.  Find a comfortable position to sleep. Use a firm mattress and lie on your side with your knees slightly bent. If you lie on your back, put a pillow under your knees.  Avoid feeling anxious or stressed.Stress increases muscle tension and can worsen back pain.It is important to recognize when you are anxious or stressed and learn ways to manage it, such as with exercise.  Take medicines only as directed by your health care provider. Over-the-counter medicines to reduce pain and  inflammation are often the most helpful.Your health care provider may prescribe muscle relaxant drugs.These medicines help dull your pain so you can more quickly return to your normal activities and healthy exercise.  Apply ice to the injured area:  Put ice in a plastic bag.  Place a towel between your skin and the bag.  Leave the ice on for 20 minutes, 2-3 times a day for the first 2-3 days. After that, ice and heat may be alternated to reduce pain and spasms.  Maintain a healthy weight. Excess weight puts extra stress on your back and makes it difficult to maintain good posture. SEEK MEDICAL CARE IF:  You have pain that is not relieved with rest or medicine.  You have increasing pain going down into the legs or buttocks.  You have pain that does not improve in one week.  You have night pain.  You lose weight.  You have a fever or chills. SEEK IMMEDIATE MEDICAL CARE IF:   You develop new bowel or bladder control problems.  You have  unusual weakness or numbness in your arms or legs.  You develop nausea or vomiting.  You develop abdominal pain.  You feel faint.   This information is not intended to replace advice given to you by your health care provider. Make sure you discuss any questions you have with your health care provider.   Document Released: 04/18/2005 Document Revised: 05/09/2014 Document Reviewed: 08/20/2013 Elsevier Interactive Patient Education [employer].

## 2015-02-19 NOTE — ED Notes (Signed)
Pt stable, ambulatory, states understanding of discharge instructions 

## 2015-02-19 NOTE — ED Notes (Signed)
Pt reports lower back for 3 weeks, denies injury but when she squats down it is hard for her to get back up because of pain. Pt has DDD, is ambulatory.

## 2015-02-19 NOTE — ED Provider Notes (Signed)
CSN: [id number]     Arrival date & time 02/19/15  1139 History  By signing my name below, I, Michelle Williams, attest that this documentation has been prepared under the direction and in the presence of Michelle Haring, PA-C Electronically Signed: Erling Williams, ED Scribe. 02/19/2015. 4:07 PM.     Chief Complaint  Patient presents with  . Back Pain    The history is provided by the patient. No language interpreter was used.    HPI Comments: Michelle Williams is a 54 y.o. female with a h/o DDD in cervical spine who presents to the Emergency Department complaining of constant, moderate, low back pain onset 3 weeks. Pt reports that pain radiates into her bilateral hips and thighs. Pt reports also urinary urgency and frequency without dysuria that she is not sure if it is related. She denies any known injury or trauma to the area. She notes that she is on her feet all day at work walking on a concrete floor in steel toed boots 9 hours most days of the week. Pt reports the pain is exacerbated when she squats down and it is hard for her to pull herself back up due to pain. She has not had any meds PTA and has not tried any treatment for her pain. Pt has tried rest with no relief. She is ambulatory without difficulty. She denies any new numbness, weakness, tingling, saddle paresthesias, or urinary/bowel incontinence.    Past Medical History  Diagnosis Date  . H/O degenerative disc disease   . H/O rheumatoid arthritis   . Hypertension    History reviewed. No pertinent past surgical history. No family history on file. Social History  Substance Use Topics  . Smoking status: Current Every Day Smoker -- 1.00 packs/day    Types: Cigarettes  . Smokeless tobacco: None  . Alcohol Use: Yes   OB History    No data available     Review of Systems  Gastrointestinal: Negative for bowel incontinence.  Genitourinary: Positive for urgency and frequency. Negative for bladder incontinence.   Musculoskeletal: Positive for back pain. Negative for gait problem.  Neurological: Negative for tingling, weakness and numbness.    Allergies  Review of patient's allergies indicates no known allergies.  Home Medications   Prior to Admission medications   Medication Sig Start Date End Date Taking? Authorizing Provider  cyclobenzaprine (FLEXERIL) 10 MG tablet Take 0.5-1 tablets (5-10 mg total) by mouth 2 (two) times daily as needed. 02/19/15   Michelle Haring, PA-C  HYDROcodone-acetaminophen (NORCO/VICODIN) 5-325 MG per tablet Take 1 tablet by mouth every 6 (six) hours as needed for pain. 08/01/12   Michelle Muskrat, MD  ibuprofen (ADVIL,MOTRIN) 800 MG tablet Take 800 mg by mouth every 8 (eight) hours as needed for pain.    Historical Provider, MD  levofloxacin (LEVAQUIN) 500 MG tablet Take 1 tablet (500 mg total) by mouth daily. 08/01/12   Michelle Muskrat, MD  metroNIDAZOLE (FLAGYL) 500 MG tablet Take 1 tablet (500 mg total) by mouth 3 (three) times daily. 08/01/12   Michelle Muskrat, MD  traMADol (ULTRAM) 50 MG tablet Take 1 tablet (50 mg total) by mouth every 6 (six) hours as needed. 02/19/15   Michelle Haring, PA-C   Triage Vitals: BP 125/82 mmHg  Pulse 89  Temp(Src) 98.3 F (36.8 C) (Oral)  Resp 20  SpO2 98%  Physical Exam  Constitutional: She is oriented to person, place, and time. She appears well-developed and well-nourished. No distress.  HENT:  Head: Normocephalic  and atraumatic.  Eyes: Conjunctivae and EOM are normal.  Neck: Neck supple. No tracheal deviation present.  Cardiovascular: Normal rate.   Pulmonary/Chest: Effort normal. No respiratory distress.  Musculoskeletal: Normal range of motion.  Symmetrical and physiologic strength to bilateral lower extremities.  Neurosensory function adequate to both legs Skin color is normal. Skin is warm and moist.  No step off deformity appreciated and no midline bony tenderness.  Can ambulate with some discomfort.  No crepitus,  laceration, effusion, induration, lesions Pedal pulses are symmetrical and palpable bilaterally  Tenderness to palpation of paraspinal and midline of spine across the lumbar spine No clonus on dorsiflextion   Neurological: She is alert and oriented to person, place, and time.  Skin: Skin is warm and dry.  Psychiatric: She has a normal mood and affect. Her behavior is normal.  Nursing note and vitals reviewed.   ED Course  Procedures (including critical care time)  DIAGNOSTIC STUDIES: Oxygen Saturation is 98% on RA, normal by my interpretation.    COORDINATION OF CARE: 2:39 PM- Will order imaging of lumbar spine, Toradol injection 60mg , and UA. Pt advised of plan for treatment and pt agrees. Her lumbar xray shows mild degenerative changes. Her urinalysis is normal. Patient referred back to PCP for further work up of urinary frequency. She has not had any incontinence. After IM Toradol shot pt reports complete resolution of pain at this time.  Labs Review Labs Reviewed  URINALYSIS, ROUTINE W REFLEX MICROSCOPIC (NOT AT [facility]) - Abnormal; Notable for the following:    APPearance HAZY (*)    All other components within normal limits    Imaging Review Dg Lumbar Spine Complete  02/19/2015  CLINICAL DATA:  Low back pain for 3 weeks. EXAM: LUMBAR SPINE - COMPLETE 4+ VIEW COMPARISON:  CT scan 08/01/2012 FINDINGS: Normal alignment of the lumbar vertebral bodies. Minimal degenerative changes. No acute bony findings. The facets are normally aligned. No pars defects. The visualized bony pelvis is intact. The SI joints appear normal. IMPRESSION: Normal alignment and no acute bony findings. Minimal degenerative changes. Electronically Signed   By: Marijo Sanes M.D.   On: 02/19/2015 15:08    I have personally reviewed and evaluated these images as part of my medical decision-making    EKG Interpretation None      MDM   Final diagnoses:  Degenerative disc disease, lumbar  Bilateral low  back pain without sciatica    54 y.o.Michelle Williams's  with back pain.   No neurological deficits and normal neuro exam. No loss of bowel or bladder control. No concern for cauda equina at this time base on HPI and physical exam findings. No fever, night sweats, weight loss, h/o cancer, IVDU. The patient can walk with some discomfort.   Patient Plan 1. Medications: NSAIDs and/or muscle relaxer. Cont usual home medications unless otherwise directed. 2. Treatment: rest, drink plenty of fluids, gentle stretching as discussed, alternate ice and heat  3. Follow Up: Please followup with your primary doctor for discussion of your diagnoses and further evaluation after today's visit; if you do not have a primary care doctor use the resource guide provided to find one  Advised to follow-up with the orthopedist if symptoms do not start to resolve in the next 2-3 days. If develop loss of bowel or urinary control return to the ED as soon as possible for further evaluation. To take the medications as prescribed as they can cause harm if not taken appropriately.   Vital signs  are stable at discharge. Filed Vitals:   02/19/15 1143  BP: 125/82  Pulse: 89  Temp: 98.3 F (36.8 C)  Resp: 20    Patient/guardian has voiced understanding and agreed to follow-up with the PCP or specialist.  I personally performed the services described in this documentation, which was scribed in my presence. The recorded information has been reviewed and is accurate.   Michelle Haring, PA-C 02/19/15 1608  Courteney Lyn Mackuen, MD 02/24/15 1510

## 2015-12-08 ENCOUNTER — Telehealth (HOSPITAL_COMMUNITY): Payer: Self-pay | Admitting: *Deleted

## 2015-12-08 NOTE — Telephone Encounter (Signed)
Telephoned patient at home # and left message to return call to [facility] 

## 2016-01-06 ENCOUNTER — Other Ambulatory Visit: Payer: Self-pay | Admitting: Obstetrics and Gynecology

## 2016-01-06 DIAGNOSIS — Z1231 Encounter for screening mammogram for malignant neoplasm of breast: Secondary | ICD-10-CM

## 2016-01-14 ENCOUNTER — Ambulatory Visit (HOSPITAL_COMMUNITY): Payer: Self-pay

## 2016-01-19 ENCOUNTER — Telehealth (HOSPITAL_COMMUNITY): Payer: Self-pay | Admitting: *Deleted

## 2016-01-19 NOTE — Telephone Encounter (Signed)
Telephoned patient at home number and left message to return call to [facility]

## 2016-02-11 ENCOUNTER — Ambulatory Visit (HOSPITAL_COMMUNITY): Payer: Self-pay | Attending: Internal Medicine

## 2016-07-22 ENCOUNTER — Emergency Department (HOSPITAL_COMMUNITY)
Admission: EM | Admit: 2016-07-22 | Discharge: 2016-07-22 | Disposition: A | Discharge status: Home / Self Care | Payer: Self-pay | Attending: Emergency Medicine | Admitting: Emergency Medicine

## 2016-07-22 ENCOUNTER — Encounter (HOSPITAL_COMMUNITY): Payer: Self-pay | Admitting: Nurse Practitioner

## 2016-07-22 DIAGNOSIS — I10 Essential (primary) hypertension: Secondary | ICD-10-CM | POA: Insufficient documentation

## 2016-07-22 DIAGNOSIS — Y939 Activity, unspecified: Secondary | ICD-10-CM | POA: Insufficient documentation

## 2016-07-22 DIAGNOSIS — F17210 Nicotine dependence, cigarettes, uncomplicated: Secondary | ICD-10-CM | POA: Insufficient documentation

## 2016-07-22 DIAGNOSIS — S199XXA Unspecified injury of neck, initial encounter: Secondary | ICD-10-CM | POA: Insufficient documentation

## 2016-07-22 DIAGNOSIS — M7918 Myalgia, other site: Secondary | ICD-10-CM

## 2016-07-22 DIAGNOSIS — Y92410 Unspecified street and highway as the place of occurrence of the external cause: Secondary | ICD-10-CM | POA: Insufficient documentation

## 2016-07-22 DIAGNOSIS — Y999 Unspecified external cause status: Secondary | ICD-10-CM | POA: Insufficient documentation

## 2016-07-22 DIAGNOSIS — M546 Pain in thoracic spine: Secondary | ICD-10-CM | POA: Insufficient documentation

## 2016-07-22 MED ORDER — METHOCARBAMOL 500 MG PO TABS: 500.0000 mg | ORAL_TABLET | Freq: Two times a day (BID) | ORAL | 0 refills | Status: DC

## 2016-07-22 MED ORDER — IBUPROFEN 600 MG PO TABS: 600.0000 mg | ORAL_TABLET | Freq: Four times a day (QID) | ORAL | 0 refills | Status: DC | PRN

## 2016-07-22 MED ORDER — TRAMADOL HCL 50 MG PO TABS: 50.0000 mg | ORAL_TABLET | Freq: Four times a day (QID) | ORAL | 0 refills | Status: DC | PRN
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
## 2016-07-22 NOTE — Discharge Instructions (Signed)
Take your medications as prescribed. I also recommend applying ice and/or heat to affected area for 15-20 minutes 3-4 times daily for additional pain relief. Refrain from doing any heavy lifting, squatting or repetitive movements that exacerbate your symptoms. Follow-up with your primary care provider in the next week if her symptoms have not improved.  Please return to the Emergency Department if symptoms worsen or new onset of fever, numbness, tingling, groin anesthesia, loss of bowel or bladder, weakness.

## 2016-10-25 ENCOUNTER — Ambulatory Visit (HOSPITAL_COMMUNITY)
Admission: RE | Admit: 2016-10-25 | Discharge: 2016-10-25 | Disposition: A | Discharge status: Home / Self Care | Payer: Medicaid Other | Source: Ambulatory Visit | Attending: Internal Medicine | Admitting: Internal Medicine

## 2016-10-25 ENCOUNTER — Other Ambulatory Visit (HOSPITAL_COMMUNITY): Payer: Self-pay | Admitting: Internal Medicine

## 2016-10-25 DIAGNOSIS — M25571 Pain in right ankle and joints of right foot: Secondary | ICD-10-CM | POA: Diagnosis present

## 2016-10-25 DIAGNOSIS — M5136 Other intervertebral disc degeneration, lumbar region: Secondary | ICD-10-CM | POA: Diagnosis not present

## 2016-10-25 DIAGNOSIS — M19071 Primary osteoarthritis, right ankle and foot: Secondary | ICD-10-CM | POA: Diagnosis not present

## 2016-10-25 DIAGNOSIS — M549 Dorsalgia, unspecified: Secondary | ICD-10-CM

## 2016-10-25 DIAGNOSIS — M545 Low back pain: Secondary | ICD-10-CM | POA: Diagnosis present

## 2016-11-07 ENCOUNTER — Other Ambulatory Visit: Payer: Self-pay | Admitting: Obstetrics and Gynecology

## 2016-11-07 DIAGNOSIS — Z1231 Encounter for screening mammogram for malignant neoplasm of breast: Secondary | ICD-10-CM

## 2016-11-22 ENCOUNTER — Ambulatory Visit
Admission: RE | Admit: 2016-11-22 | Discharge: 2016-11-22 | Disposition: A | Discharge status: Home / Self Care | Payer: Self-pay | Source: Ambulatory Visit | Attending: Obstetrics and Gynecology | Admitting: Obstetrics and Gynecology

## 2016-11-22 ENCOUNTER — Encounter (HOSPITAL_COMMUNITY): Payer: Self-pay

## 2016-11-22 ENCOUNTER — Ambulatory Visit (HOSPITAL_COMMUNITY)
Admission: RE | Admit: 2016-11-22 | Discharge: 2016-11-22 | Disposition: A | Discharge status: Home / Self Care | Payer: Self-pay | Source: Ambulatory Visit | Attending: Internal Medicine | Admitting: Internal Medicine

## 2016-11-22 VITALS — BP 152/100 | Ht 64.0 in | Wt 163.4 lb

## 2016-11-22 DIAGNOSIS — Z1231 Encounter for screening mammogram for malignant neoplasm of breast: Secondary | ICD-10-CM

## 2016-11-22 DIAGNOSIS — Z01419 Encounter for gynecological examination (general) (routine) without abnormal findings: Secondary | ICD-10-CM

## 2016-11-22 HISTORY — DX: Malignant neoplasm of cervix uteri, unspecified: C53.9

## 2016-11-22 NOTE — Progress Notes (Signed)
No complaints today.   Pap Smear: Pap smear completed today. Last Pap smear was 01/12/2009 and normal. Per patient has a history of an abnormal Pap smear when she was in her 32's that cryotherapy was completed for follow up of cervical cancer. Last Pap smear result is in EPIC.  Physical exam: Breasts Left breast slightly larger than right breast that per patient is normal for her. No skin abnormalities bilateral breasts. No nipple retraction bilateral breasts. No nipple discharge bilateral breasts. No lymphadenopathy. No lumps palpated bilateral breasts. No complaints of pain or tenderness on exam. [facility] to [facility] screening [facility]. Appointment scheduled for Tuesday, November 22, 2016 at 1200.  Pelvic/Bimanual   Ext Genitalia No lesions, no swelling and no discharge observed on external genitalia.         Vagina Vagina pink and normal texture. No lesions or discharge observed in vagina.          Cervix Cervix is present. Cervix pink and of normal texture. No discharge observed.     Uterus Uterus is present and palpable. Uterus in normal position and normal size.        Adnexae Bilateral ovaries present and palpable. No tenderness on palpation.          Rectovaginal No rectal exam completed today since patient had no rectal complaints. No skin abnormalities observed on exam.    Smoking History: Patient has never smoked.  Patient Navigation: Patient education provided. Access to services provided for patient through [facility] program.   Colorectal Cancer Screening: Per patient has never had a colonoscopy completed. No complaints today. FIT Test given to patient to complete and return to [facility].

## 2016-11-22 NOTE — Patient Instructions (Addendum)
Explained breast self awareness with [name]. Let patient know that if today's Pap smear is normal and HPV negative that her Pap smear will be due in one year due to history of cervical per patient. Referred patient to the [facility] for a screening mammogram. Appointment scheduled for Tuesday, November 22, 2016 at 1200. Let patient know will follow up with her within the next couple weeks with results of Pap smear by phone. Informed patient that [facility] will follow up with her within the next couple of weeks with results of mammogram by letter or phone. [name] verbalized understanding.  Kamiya Acord, Arvil Chaco, RN 11:03 AM

## 2016-11-23 ENCOUNTER — Encounter (HOSPITAL_COMMUNITY): Payer: Self-pay

## 2016-11-23 ENCOUNTER — Encounter (HOSPITAL_COMMUNITY): Payer: Self-pay | Admitting: *Deleted

## 2016-11-23 ENCOUNTER — Other Ambulatory Visit: Payer: Self-pay

## 2016-11-23 LAB — CYTOLOGY - PAP
DIAGNOSIS: NEGATIVE
HPV: NOT DETECTED

## 2016-11-24 ENCOUNTER — Other Ambulatory Visit (HOSPITAL_COMMUNITY): Payer: Self-pay | Admitting: *Deleted

## 2016-11-24 DIAGNOSIS — B379 Candidiasis, unspecified: Secondary | ICD-10-CM

## 2016-11-24 MED ORDER — FLUCONAZOLE 150 MG PO TABS: 150.0000 mg | ORAL_TABLET | Freq: Once | ORAL | 0 refills | Status: AC

## 2016-11-24 NOTE — Progress Notes (Signed)
Telephoned patient at home number at home number and advised patient of negative pap smear results. HPV was negative. Advised patient pap smear did show yeast and medication was called into pharmacy. Next pap smear due in five years. Patient voiced understanding.

## 2016-11-30 ENCOUNTER — Other Ambulatory Visit: Payer: Self-pay | Admitting: Obstetrics and Gynecology

## 2016-11-30 DIAGNOSIS — R928 Other abnormal and inconclusive findings on diagnostic imaging of breast: Secondary | ICD-10-CM

## 2016-11-30 LAB — FECAL OCCULT BLOOD, IMMUNOCHEMICAL: Fecal Occult Bld: POSITIVE — AB

## 2016-12-07 ENCOUNTER — Other Ambulatory Visit: Payer: Self-pay | Admitting: Obstetrics and Gynecology

## 2016-12-07 ENCOUNTER — Telehealth (HOSPITAL_COMMUNITY): Payer: Self-pay | Admitting: *Deleted

## 2016-12-07 ENCOUNTER — Ambulatory Visit
Admission: RE | Admit: 2016-12-07 | Discharge: 2016-12-07 | Disposition: A | Discharge status: Home / Self Care | Payer: No Typology Code available for payment source | Source: Ambulatory Visit | Attending: Obstetrics and Gynecology | Admitting: Obstetrics and Gynecology

## 2016-12-07 DIAGNOSIS — R928 Other abnormal and inconclusive findings on diagnostic imaging of breast: Secondary | ICD-10-CM

## 2016-12-07 DIAGNOSIS — N6320 Unspecified lump in the left breast, unspecified quadrant: Secondary | ICD-10-CM

## 2016-12-07 NOTE — Telephone Encounter (Signed)
Attempted to call patient to give FIT test results. No one answered phone. Left voicemail for patient to call me back.

## 2016-12-14 ENCOUNTER — Telehealth (HOSPITAL_COMMUNITY): Payer: Self-pay | Admitting: *Deleted

## 2016-12-14 ENCOUNTER — Encounter: Payer: Self-pay | Admitting: Gastroenterology

## 2016-12-14 NOTE — Telephone Encounter (Signed)
Called patient to discuss FIT Test Result. Let her know that her FIT Test was a Positive Abnormal. Patient has the orange card. Told patient that [facility] excepts the orange card an will call to make a referral.   Called patient back after called [facility] and spoke with Lorriane Shire. Gave patient referral and someone from their office will call patient to schedule appointment.  Called patient back and let her know someone from [facility] will call her to schedule an appointment. Patient verbalized understanding.

## 2017-01-25 ENCOUNTER — Ambulatory Visit (AMBULATORY_SURGERY_CENTER): Payer: Self-pay | Admitting: *Deleted

## 2017-01-25 VITALS — Ht 64.0 in | Wt 160.6 lb

## 2017-01-25 DIAGNOSIS — Z800 Family history of malignant neoplasm of digestive organs: Secondary | ICD-10-CM

## 2017-01-25 DIAGNOSIS — R195 Other fecal abnormalities: Secondary | ICD-10-CM

## 2017-01-25 MED ORDER — NA SULFATE-K SULFATE-MG SULF 17.5-3.13-1.6 GM/177ML PO SOLN: 1.0000 | Freq: Once | ORAL | 0 refills | Status: AC

## 2017-01-25 NOTE — Progress Notes (Signed)
No egg or soy allergy known to patient  No issues with past sedation with any surgeries  or procedures, no intubation problems  No diet pills per patient No home 02 use per patient  No blood thinners per patient  Pt states some  issues with constipation but thinks because of her pain meds - states not hard but doesn't go every day  No A fib or A flutter  EMMI video sent to pt's e mail -- pt declined Pt has Melissa so sample of suprep to pt--- Samples of this drug were given to the patient, quantity [id number], Lot Number [id number] as directed

## 2017-02-08 ENCOUNTER — Encounter: Payer: Self-pay | Admitting: Gastroenterology

## 2017-02-08 ENCOUNTER — Ambulatory Visit (AMBULATORY_SURGERY_CENTER): Payer: Self-pay | Admitting: Gastroenterology

## 2017-02-08 VITALS — BP 144/93 | HR 58 | Temp 97.2°F | Resp 14 | Ht 64.0 in | Wt 160.0 lb

## 2017-02-08 DIAGNOSIS — Z1212 Encounter for screening for malignant neoplasm of rectum: Secondary | ICD-10-CM

## 2017-02-08 DIAGNOSIS — Z1211 Encounter for screening for malignant neoplasm of colon: Secondary | ICD-10-CM

## 2017-02-08 DIAGNOSIS — Z800 Family history of malignant neoplasm of digestive organs: Secondary | ICD-10-CM

## 2017-02-08 DIAGNOSIS — D122 Benign neoplasm of ascending colon: Secondary | ICD-10-CM

## 2017-02-08 MED ORDER — SODIUM CHLORIDE 0.9 % IV SOLN: 500.0000 mL | INTRAVENOUS | Status: DC

## 2017-02-08 NOTE — Patient Instructions (Signed)
YOU HAD AN ENDOSCOPIC PROCEDURE TODAY AT [facility]:   Refer to the procedure report that was given to you for any specific questions about what was found during the examination.  If the procedure report does not answer your questions, please call your gastroenterologist to clarify.  If you requested that your care partner not be given the details of your procedure findings, then the procedure report has been included in a sealed envelope for you to review at your convenience later.  YOU SHOULD EXPECT: Some feelings of bloating in the abdomen. Passage of more gas than usual.  Walking can help get rid of the air that was put into your GI tract during the procedure and reduce the bloating. If you had a lower endoscopy (such as a colonoscopy or flexible sigmoidoscopy) you may notice spotting of blood in your stool or on the toilet paper. If you underwent a bowel prep for your procedure, you may not have a normal bowel movement for a few days.  Please Note:  You might notice some irritation and congestion in your nose or some drainage.  This is from the oxygen used during your procedure.  There is no need for concern and it should clear up in a day or so.  SYMPTOMS TO REPORT IMMEDIATELY:   Following lower endoscopy (colonoscopy or flexible sigmoidoscopy):  Excessive amounts of blood in the stool  Significant tenderness or worsening of abdominal pains  Swelling of the abdomen that is new, acute  Fever of 100F or higher   For urgent or emergent issues, a gastroenterologist can be reached at any hour by calling [phone].   DIET:  We do recommend a small meal at first, but then you may proceed to your regular diet.  Drink plenty of fluids but you should avoid alcoholic beverages for 24 hours.  ACTIVITY:  You should plan to take it easy for the rest of today and you should NOT DRIVE or use heavy machinery until tomorrow (because of the sedation medicines used during the test).     FOLLOW UP: Our staff will call the number listed on your records the next business day following your procedure to check on you and address any questions or concerns that you may have regarding the information given to you following your procedure. If we do not reach you, we will leave a message.  However, if you are feeling well and you are not experiencing any problems, there is no need to return our call.  We will assume that you have returned to your regular daily activities without incident.  If any biopsies were taken you will be contacted by phone or by letter within the next 1-3 weeks.  Please call us at [phone] if you have not heard about the biopsies in 3 weeks.    SIGNATURES/CONFIDENTIALITY: You and/or your care partner have signed paperwork which will be entered into your electronic medical record.  These signatures attest to the fact that that the information above on your After Visit Summary has been reviewed and is understood.  Full responsibility of the confidentiality of this discharge information lies with you and/or your care-partner.  Polyp information given.  Recall colonoscopy 5 years-2023

## 2017-02-08 NOTE — Progress Notes (Signed)
Pt's states no medical or surgical changes since previsit or office visit. 

## 2017-02-08 NOTE — Progress Notes (Signed)
To PACU, VSS. Report to [email or web address] 

## 2017-02-08 NOTE — Progress Notes (Signed)
Called to room to assist during endoscopic procedure.  Patient ID and intended procedure confirmed with present staff. Received instructions for my participation in the procedure from the performing physician.  

## 2017-02-08 NOTE — Op Note (Signed)
[facility] Patient Name: Michelle Williams Procedure Date: 02/08/2017 9:50 AM MRN: [id number] Endoscopist: Mallie Mussel L. Loletha Carrow , MD Age: 56 Referring MD:  Date of Birth: 27-Nov-1960 Gender: Female Account #: [id number] Procedure:                Colonoscopy Indications:              Colon cancer screening in patient at increased                            risk: Colorectal cancer in brother, This is the                            patient's first colonoscopy Medicines:                Monitored Anesthesia Care Procedure:                Pre-Anesthesia Assessment:                           - Prior to the procedure, a History and Physical                            was performed, and patient medications and                            allergies were reviewed. The patient's tolerance of                            previous anesthesia was also reviewed. The risks                            and benefits of the procedure and the sedation                            options and risks were discussed with the patient.                            All questions were answered, and informed consent                            was obtained. Prior Anticoagulants: The patient has                            taken no previous anticoagulant or antiplatelet                            agents. ASA Grade Assessment: II - A patient with                            mild systemic disease. After reviewing the risks                            and benefits, the patient was deemed in  satisfactory condition to undergo the procedure.                           After obtaining informed consent, the colonoscope                            was passed under direct vision. Throughout the                            procedure, the patient's blood pressure, pulse, and                            oxygen saturations were monitored continuously. The                            Model PCF-[id number]) scope  was introduced                            through the anus and advanced to the the cecum,                            identified by appendiceal orifice and ileocecal                            valve. The colonoscopy was performed without                            difficulty. The patient tolerated the procedure                            well. The quality of the bowel preparation was                            good. The ileocecal valve, appendiceal orifice, and                            rectum were photographed. The quality of the bowel                            preparation was evaluated using the BBPS [facility]                            Bowel Preparation Scale) with scores of: Right                            Colon = 2, Transverse Colon = 2 and Left Colon = 2.                            The total BBPS score equals 6. The bowel                            preparation used was SUPREP. Scope In: 10:02:15 AM Scope Out: 10:16:55 AM Scope Withdrawal Time: 0 hours 10 minutes  9 seconds  Total Procedure Duration: 0 hours 14 minutes 40 seconds  Findings:                 The perianal and digital rectal examinations were                            normal.                           Two sessile polyps were found in the ascending                            colon. The polyps were 2 mm in size. These polyps                            were removed with a cold biopsy forceps. Resection                            and retrieval were complete.                           The exam was otherwise without abnormality on                            direct and retroflexion views. Complications:            No immediate complications. Estimated Blood Loss:     Estimated blood loss: none. Impression:               - Two 2 mm polyps in the ascending colon, removed                            with a cold biopsy forceps. Resected and retrieved.                           - The examination was otherwise normal on direct                             and retroflexion views. Recommendation:           - Patient has a contact number available for                            emergencies. The signs and symptoms of potential                            delayed complications were discussed with the                            patient. Return to normal activities tomorrow.                            Written discharge instructions were provided to the                            patient.                           -  Resume previous diet.                           - Continue present medications.                           - Await pathology results.                           - Repeat colonoscopy in 5 years for surveillance. Guss Farruggia L. Loletha Carrow, MD 02/08/2017 10:24:07 AM This report has been signed electronically.

## 2017-02-09 ENCOUNTER — Telehealth: Payer: Self-pay | Admitting: *Deleted

## 2017-02-09 NOTE — Telephone Encounter (Signed)
  Call back number 02/08/2017  Post procedure Call Back phone  # [phone]  Permission to leave phone message Yes  Some recent data might be hidden     Patient questions:  Do you have a fever, pain , or abdominal swelling? No. Patient states nausea last night and some vomiting. None as of now. Pain Score  0 *  Have you tolerated food without any problems? No. Suggested bland diet for today  Have you been able to return to your normal activities? Yes.    Do you have any questions about your discharge instructions: Diet   Yes.   Medications  No. Follow up visit  No.  Do you have questions or concerns about your Care? Yes.    Actions: * If pain score is 4 or above: No action needed, pain <4.

## 2017-02-14 ENCOUNTER — Encounter: Payer: Self-pay | Admitting: Gastroenterology

## 2017-02-20 ENCOUNTER — Encounter (HOSPITAL_COMMUNITY): Payer: Self-pay | Admitting: Emergency Medicine

## 2017-02-20 ENCOUNTER — Emergency Department (HOSPITAL_COMMUNITY)
Admission: EM | Admit: 2017-02-20 | Discharge: 2017-02-20 | Disposition: A | Discharge status: Home / Self Care | Payer: Medicaid Other | Attending: Emergency Medicine | Admitting: Emergency Medicine

## 2017-02-20 DIAGNOSIS — R51 Headache: Secondary | ICD-10-CM | POA: Diagnosis not present

## 2017-02-20 DIAGNOSIS — I10 Essential (primary) hypertension: Secondary | ICD-10-CM | POA: Diagnosis not present

## 2017-02-20 DIAGNOSIS — J029 Acute pharyngitis, unspecified: Secondary | ICD-10-CM | POA: Insufficient documentation

## 2017-02-20 DIAGNOSIS — J3489 Other specified disorders of nose and nasal sinuses: Secondary | ICD-10-CM | POA: Diagnosis not present

## 2017-02-20 DIAGNOSIS — Z79899 Other long term (current) drug therapy: Secondary | ICD-10-CM | POA: Insufficient documentation

## 2017-02-20 DIAGNOSIS — H9202 Otalgia, left ear: Secondary | ICD-10-CM | POA: Insufficient documentation

## 2017-02-20 DIAGNOSIS — R519 Headache, unspecified: Secondary | ICD-10-CM

## 2017-02-20 DIAGNOSIS — F17210 Nicotine dependence, cigarettes, uncomplicated: Secondary | ICD-10-CM | POA: Diagnosis not present

## 2017-02-20 DIAGNOSIS — Z8541 Personal history of malignant neoplasm of cervix uteri: Secondary | ICD-10-CM | POA: Diagnosis not present

## 2017-02-20 MED ORDER — DIPHENHYDRAMINE HCL 25 MG PO CAPS: 25.0000 mg | ORAL_CAPSULE | Freq: Every day | ORAL | 0 refills | Status: DC

## 2017-02-20 NOTE — ED Triage Notes (Addendum)
Left ear pain x 2 weeks denies drainage , and has a bump on her abd that hurts, area on abd is small and has slight drainage , no redness

## 2017-02-20 NOTE — Discharge Instructions (Signed)
I have attached our dental resource guide, please use this to find a local dentist for evaluation of your left mouth and facial pain.  Your left ear is not infected and you do not need antibiotics.  Please take Benadryl 25 mg at night to help with sleep. This can also help with congestion. I have written you a prescription for this, but you can also get this over-the-counter.  Take 600 mg ibuprofen every 6 hours for pain and inflammation. You may continue taking oxycodone which is prescribed to you by your pain specialist.  Please follow-up with your primary doctor in a week if your symptoms have not improved. Return to the emergency department if you develop fever or new or worsening symptoms.

## 2017-02-20 NOTE — ED Provider Notes (Signed)
[facility] EMERGENCY DEPARTMENT Provider Note   CSN: [id number] Arrival date & time: 02/20/17  1412     History   Chief Complaint Chief Complaint  Patient presents with  . Otalgia    HPI Michelle Williams is a 56 y.o. female.  HPI   Michelle Williams is a 56 year old female with a history of degenerative disc disease (sees pain specialist for chronic opioid management), rheumatoid arthritis, hypertension, anxiety who presents the emergency department for evaluation of left ear, jaw and mouth pain. States this has been worsening over the past month. Her pain is 10/10, constant and when asked to describe the pain she states "it just hurts." Her pain is worse at night when she lies on her left side. She also endorses sore throat, congestion and rhinorrhea. States that she has taken ibuprofen, Goody's powder, oxycodone prescribed to her by the pain clinic for her degenerative disc disease without significant relief. She is concerned that the pain is originating from poor dentition, as she has many missing teeth and has not seen a dentist in several years. Denies fever, headache, cough, ear drainage, hearing loss, shortness of breath, chest pain, abdominal pain, N/V.   Past Medical History:  Diagnosis Date  . Allergy   . Anemia   . Anxiety   . Arthritis   . Cervical cancer ([place])   . H/O degenerative disc disease   . H/O rheumatoid arthritis   . Hypertension     There are no active problems to display for this patient.   Past Surgical History:  Procedure Laterality Date  . BREAST EXCISIONAL BIOPSY Right   . IR FALLOPIAN TUBE CATHETERIZATION      OB History    Gravida Para Term Preterm AB Living   1       1     SAB TAB Ectopic Multiple Live Births   1               Home Medications    Prior to Admission medications   Medication Sig Start Date End Date Taking? Authorizing Provider  cyclobenzaprine (FLEXERIL) 10 MG tablet Take 10 mg by mouth at bedtime as  needed. 12/12/16   [provider]  gabapentin (NEURONTIN) 300 MG capsule Take 300 mg by mouth 2 (two) times daily.    [provider]  HYDROcodone-acetaminophen (NORCO/VICODIN) 5-325 MG per tablet Take 1 tablet by mouth every 6 (six) hours as needed for pain. Patient not taking: Reported on 01/25/2017 08/01/12   Carmin Muskrat, MD  ibuprofen (ADVIL,MOTRIN) 600 MG tablet Take 1 tablet (600 mg total) by mouth every 6 (six) hours as needed. 07/22/16   Nona Dell, PA-C  loratadine (CLARITIN) 10 MG tablet Take 10 mg by mouth daily.    [provider]  methocarbamol (ROBAXIN) 500 MG tablet Take 1 tablet (500 mg total) by mouth 2 (two) times daily. 07/22/16   Nona Dell, PA-C  naproxen (NAPROSYN) 500 MG tablet Take 500 mg by mouth 2 (two) times daily with a meal.    [provider]  oxyCODONE-acetaminophen (PERCOCET) 7.5-325 MG tablet TK 1 T PO BID PRN P 12/12/16   [provider]  triamterene-hydrochlorothiazide (MAXZIDE-25) 37.5-25 MG tablet Take 1 tablet by mouth daily.    [provider]  Vitamin D, Ergocalciferol, (DRISDOL) 50000 units CAPS capsule Take 50,000 Units by mouth every 7 (seven) days.    [provider]    Family History Family History  Problem Relation Age  of Onset  . Hypertension Mother   . Stroke Mother   . Diabetes Maternal Grandmother   . Colon cancer Brother        unsure age when diagnosed   . Colon polyps Neg Hx   . Esophageal cancer Neg Hx   . Rectal cancer Neg Hx   . Stomach cancer Neg Hx     Social History Social History  Substance Use Topics  . Smoking status: Current Every Day Smoker    Packs/day: 1.00    Types: Cigarettes  . Smokeless tobacco: Never Used  . Alcohol use No     Allergies   Patient has no known allergies.   Review of Systems Review of Systems  Constitutional: Negative for chills, fatigue and fever.  HENT: Positive for congestion, dental problem, ear  pain, rhinorrhea and sore throat. Negative for ear discharge, facial swelling, hearing loss, sinus pain and sinus pressure.   Respiratory: Negative for shortness of breath.   Cardiovascular: Negative for chest pain.  Gastrointestinal: Negative for abdominal pain, nausea and vomiting.  Musculoskeletal: Positive for neck pain (chronic).     Physical Exam Updated Vital Signs BP (!) 154/87 (BP Location: Left Arm)   Pulse 81   Temp 98.7 F (37.1 C) (Oral)   Resp 16   SpO2 96%   Physical Exam  Constitutional: She appears well-developed and well-nourished. No distress.  HENT:  Head: Normocephalic and atraumatic.  Mouth/Throat:    Clear rhinorrhea noted in the nasal cavity. No tenderness over frontal or maxillary sinus. No tenderness over tragus or auricles. Bilateral ears with good cone of light. No tenderness over the mastoids. Dental cavities and poor oral dentition noted. Pain along tooth as depicted in image. No abscess noted. Midline uvula. No trismus. OP mildly erythematous, no tonsillar exudate. No oropharyngeal erythema or edema. Neck supple with no tenderness. No facial edema.   Eyes: Pupils are equal, round, and reactive to light. Conjunctivae are normal. Right eye exhibits no discharge. Left eye exhibits no discharge.  Neck: Normal range of motion. Neck supple.  Cardiovascular: Normal rate and regular rhythm.  Exam reveals no friction rub.   No murmur heard. Pulmonary/Chest: Effort normal and breath sounds normal. No respiratory distress. She has no wheezes. She has no rales.  Neurological: She is alert. Coordination normal.  Skin: She is not diaphoretic.  Psychiatric: She has a normal mood and affect. Her behavior is normal.  Nursing note and vitals reviewed.    ED Treatments / Results  Labs (all labs ordered are listed, but only abnormal results are displayed) Labs Reviewed - No data to display  EKG  EKG Interpretation None       Radiology No results  found.  Procedures Procedures (including critical care time)  Medications Ordered in ED Medications - No data to display   Initial Impression / Assessment and Plan / ED Course  I have reviewed the triage vital signs and the nursing notes.  Pertinent labs & imaging results that were available during my care of the patient were reviewed by me and considered in my medical decision making (see chart for details).    Patient presents with left sided facial pain and ear pain. Exam non-concerning for otitis media, mastoiditis. Suspect that patient's pain is related to viral URI given her congestion, sore throat. Exam non-concerning for PTA or RPA. Will treat symptomatically with NSAIDs and OTC cold medication. She also has left upper dental pain which may be contributing to her symptoms. No  gross abscess.  Exam unconcerning for Ludwig's angina or spread of infection. Have given her dental resource guide to follow up with a dentist. Encouraged patient to follow up with primary doctor if her symptoms do not improve in a week. Her blood pressure was also mildly elevated, discussed recheck. Discussed return precautions. Patient agrees and voices understanding to the above plan  Final Clinical Impressions(s) / ED Diagnoses   Final diagnoses:  None    New Prescriptions New Prescriptions   No medications on file     Bernarda Caffey 02/20/17 Marshell Garfinkel, MD 02/21/17 1049

## 2017-05-26 ENCOUNTER — Telehealth (HOSPITAL_COMMUNITY): Payer: Self-pay

## 2017-05-26 NOTE — Telephone Encounter (Signed)
Phoned patient to remind her to schedule her six month follow up mammogram.

## 2017-06-12 ENCOUNTER — Other Ambulatory Visit: Payer: Self-pay

## 2017-06-19 ENCOUNTER — Telehealth (HOSPITAL_COMMUNITY): Payer: Self-pay

## 2017-06-19 NOTE — Telephone Encounter (Signed)
Phoned patient to discuss the need to follow up for her mammogram. No answer.

## 2018-05-21 ENCOUNTER — Other Ambulatory Visit: Payer: Self-pay | Admitting: Podiatrist

## 2018-05-21 ENCOUNTER — Ambulatory Visit (INDEPENDENT_AMBULATORY_CARE_PROVIDER_SITE_OTHER): Payer: Medicaid Other | Admitting: Podiatrist

## 2018-05-21 ENCOUNTER — Other Ambulatory Visit: Payer: Self-pay

## 2018-05-21 VITALS — BP 157/97 | HR 85

## 2018-05-21 DIAGNOSIS — B351 Tinea unguium: Secondary | ICD-10-CM

## 2018-05-21 NOTE — Patient Instructions (Signed)

## 2018-05-22 ENCOUNTER — Other Ambulatory Visit: Payer: Self-pay | Admitting: Family Medicine

## 2018-05-22 DIAGNOSIS — N6320 Unspecified lump in the left breast, unspecified quadrant: Secondary | ICD-10-CM

## 2018-05-22 LAB — HEPATIC FUNCTION PANEL
ALK PHOS: 90 IU/L (ref 39–117)
ALT: 16 IU/L (ref 0–32)
AST: 17 IU/L (ref 0–40)
Albumin: 4.2 g/dL (ref 3.8–4.9)
Bilirubin Total: <0.2 mg/dL (ref 0.0–1.2)
Bilirubin, Direct: 0.05 mg/dL (ref 0.00–0.40)
Total Protein: 6.9 g/dL (ref 6.0–8.5)

## 2018-05-24 NOTE — Progress Notes (Signed)
Thanks Mateo Flow-  I just need a liver function panel prior to starting her on Lamisil.  I didn't have computer access Monday so Nira Conn should have ordered it for me?  If you could put in the order I would appreciate it if it isn't done yet.  Thanks!

## 2018-05-25 NOTE — Progress Notes (Signed)
Dr. Valentina Lucks pt's blood work is available and the fungal culture appears to be processing.

## 2018-05-26 ENCOUNTER — Encounter: Payer: Self-pay | Admitting: Podiatrist

## 2018-05-26 NOTE — Progress Notes (Signed)
  Subjective:     Patient ID: Michelle Williams, female   DOB: 09/06/1960, 58 y.o.   MRN: [id number]  HPI patient presents today with chief complaint of toenail fungus on bilateral great toenails right being more severe than the left.  She is tried various lotions and creams and states she played softball for years when she was young which she wonders if could have contributed to her toenail fungus.   Review of Systems  All other systems reviewed and are negative.      Objective:   Physical Exam  Vascular status is intact with pedal pulses palpable at [facility] and PT 2/out of 4 bilateral.  Digital hair growth noted bilateral normal capillary refill time. Neurological sensation is intact epicritic and protective Liviu Semmes Weinstein monofilament 5.075 out of 5 sites.  Light touch and vibratory sensation intact bilateral. Musculoskeletal examination is intact with normal muscle strength tone and stability bilateral.  Rectus foot type noted bilateral. Dermatological examination reveals dystrophic, discolored, subungual debris is present right hallux nail left hallux nail and left fifth digit.  Multiple signs of onychomycosis present.  Remainder of skin exam is normal.     Assessment:     Onychomycosis    Plan:     We discussed treatment options and alternatives.  The patient would like to consider Lamisil therapy.  A sample of the nail was taken and will be tested for fungal culture.  Patient was given lab work for hepatic function panel will call her with the results and will call in Lamisil if indicated.

## 2018-05-28 ENCOUNTER — Ambulatory Visit
Admission: RE | Admit: 2018-05-28 | Discharge: 2018-05-28 | Disposition: A | Discharge status: Home / Self Care | Payer: Medicaid Other | Source: Ambulatory Visit | Attending: Family Medicine | Admitting: Family Medicine

## 2018-05-28 DIAGNOSIS — N6320 Unspecified lump in the left breast, unspecified quadrant: Secondary | ICD-10-CM

## 2018-06-11 ENCOUNTER — Telehealth: Payer: Self-pay | Admitting: Podiatrist

## 2018-06-11 NOTE — Telephone Encounter (Signed)
I'm calling to see what the results are of my blood test to start the antifungal. Please call me back as I have not heard anything from anyone.

## 2018-06-13 ENCOUNTER — Telehealth: Payer: Self-pay | Admitting: *Deleted

## 2018-06-13 ENCOUNTER — Encounter: Payer: Self-pay | Admitting: *Deleted

## 2018-06-13 DIAGNOSIS — B351 Tinea unguium: Secondary | ICD-10-CM

## 2018-06-13 DIAGNOSIS — Z79899 Other long term (current) drug therapy: Secondary | ICD-10-CM

## 2018-06-13 MED ORDER — TERBINAFINE HCL 250 MG PO TABS: 250.0000 mg | ORAL_TABLET | Freq: Every day | ORAL | 0 refills | Status: DC

## 2018-06-13 NOTE — Telephone Encounter (Signed)
[facility] results are available and sent to scan.

## 2018-06-13 NOTE — Telephone Encounter (Signed)
[phone] Phone is non-working number.

## 2018-06-13 NOTE — Telephone Encounter (Signed)
Mailed letter to pt with explanation and orders for repeat labs.

## 2018-06-13 NOTE — Telephone Encounter (Signed)
-----   Message from Bronson Ing, DPM sent at 06/13/2018  2:16 PM EST ----- Regarding: fungal culture Fungal culture is negative for fungus-  due to the clinical appearance of the toenail it is still reasonable to try her on Lamisil to see if it will clear the nail (although the result is not guaranteed)  if she would like to try lamisil please call in a rx and have her repeat blood work in 1 month if its possible to schedule that for her.  THANK YOU!!  Dr. Johnette Abraham

## 2018-06-13 NOTE — Telephone Encounter (Signed)
[phone] is the wrong number for pt.

## 2018-06-13 NOTE — Telephone Encounter (Signed)
I was waiting on the fungal culture to come back-- blood work is great  Is there a portal for [facility] where I can check results or do we just wait??  Thanks!

## 2018-06-13 NOTE — Addendum Note (Signed)
Addended by: Harriett Sine D on: 06/13/2018 05:15 PM   Modules accepted: Orders

## 2018-06-13 NOTE — Telephone Encounter (Signed)
Michelle Williams states she will fax the results to [phone].

## 2018-06-16 NOTE — Telephone Encounter (Signed)
Her nail did not grow fungus- (stated thickening due to trauma)  We can still try an antifungal but it is not guaranteed to work-  If she would like to try lamisil please call in for her- thanks!

## 2018-07-26 ENCOUNTER — Encounter (HOSPITAL_COMMUNITY): Payer: Self-pay | Admitting: Emergency Medicine

## 2018-07-26 ENCOUNTER — Ambulatory Visit (HOSPITAL_COMMUNITY)
Admission: EM | Admit: 2018-07-26 | Discharge: 2018-07-26 | Disposition: A | Discharge status: Home / Self Care | Payer: Medicare Other | Attending: Internal Medicine | Admitting: Internal Medicine

## 2018-07-26 ENCOUNTER — Other Ambulatory Visit: Payer: Self-pay

## 2018-07-26 DIAGNOSIS — M62830 Muscle spasm of back: Secondary | ICD-10-CM

## 2018-07-26 NOTE — ED Triage Notes (Signed)
PT reports right sided back pain. Feels tight, worse with movements. Stretching helps.

## 2018-07-26 NOTE — ED Provider Notes (Addendum)
[facility]    CSN: [id number] Arrival date & time: 07/26/18  1030     History   Chief Complaint Chief Complaint  Patient presents with  . Back Pain    HPI Michelle Williams is a 58 y.o. female with a history of hypertension comes to the urgent care with complaints of right-sided back pain of a few days duration.  Symptoms started insidiously and has been persistent over the past few days.  Patient works as a Scientist, product/process development and occasionally carries heavy objects as part of her daily job routine.  No numbness or tingling in the lower extremities.  Pain is aggravated by movement and seems to be relieved when she does gentle stretches.  No trauma to the low back.  No weakness in the legs. HPI  Past Medical History:  Diagnosis Date  . Allergy   . Anemia   . Anxiety   . Arthritis   . Cervical cancer ([place])   . H/O degenerative disc disease   . H/O rheumatoid arthritis   . Hypertension     Patient Active Problem List   Diagnosis Date Noted  . Anxiety 05/09/2014  . Chronic pain syndrome 05/09/2014  . Degenerative disc disease, cervical 05/09/2014    Past Surgical History:  Procedure Laterality Date  . BREAST EXCISIONAL BIOPSY Right   . IR FALLOPIAN TUBE CATHETERIZATION      OB History    Gravida  1   Para      Term      Preterm      AB  1   Living        SAB  1   TAB      Ectopic      Multiple      Live Births               Home Medications    Prior to Admission medications   Medication Sig Start Date End Date Taking? Authorizing Provider  cyclobenzaprine (FLEXERIL) 10 MG tablet Take 10 mg by mouth at bedtime as needed. 12/12/16  Yes [provider]  gabapentin (NEURONTIN) 300 MG capsule Take 300 mg by mouth 2 (two) times daily.   Yes [provider]  loratadine (CLARITIN) 10 MG tablet Take 10 mg by mouth daily.   Yes [provider]  naproxen (NAPROSYN) 500 MG tablet Take 500 mg by mouth 2 (two) times daily with a  meal.   Yes [provider]  triamterene-hydrochlorothiazide (MAXZIDE-25) 37.5-25 MG tablet Take 1 tablet by mouth daily.   Yes [provider]  cetirizine (ZYRTEC) 10 MG tablet TK 1 T PO  ONCE D 05/08/18   [provider]  diphenhydrAMINE (BENADRYL) 25 mg capsule Take 1 capsule (25 mg total) by mouth at bedtime. 02/20/17   Glyn Ade, PA-C  fluticasone (FLONASE) 50 MCG/ACT nasal spray SW AND USE 1 TO 2 SPRAYS IEN ONCE D 05/08/18   [provider]  ibuprofen (ADVIL,MOTRIN) 600 MG tablet Take 1 tablet (600 mg total) by mouth every 6 (six) hours as needed. 07/22/16   Nona Dell, PA-C  methocarbamol (ROBAXIN) 500 MG tablet Take 1 tablet (500 mg total) by mouth 2 (two) times daily. 07/22/16   Nona Dell, PA-C  oxyCODONE-acetaminophen (PERCOCET) 7.5-325 MG tablet TK 1 T PO BID PRN P 12/12/16   [provider]  Vitamin D, Ergocalciferol, (DRISDOL) 50000 units CAPS capsule Take 50,000 Units by mouth every 7 (seven) days.  [provider]    Family History Family History  Problem Relation Age of Onset  . Hypertension Mother   . Stroke Mother   . Diabetes Maternal Grandmother   . Colon cancer Brother        unsure age when diagnosed   . Colon polyps Neg Hx   . Esophageal cancer Neg Hx   . Rectal cancer Neg Hx   . Stomach cancer Neg Hx     Social History Social History   Tobacco Use  . Smoking status: Current Every Day Smoker    Packs/day: 1.00    Types: Cigarettes  . Smokeless tobacco: Never Used  Substance Use Topics  . Alcohol use: No  . Drug use: No     Allergies   Patient has no known allergies.   Review of Systems Review of Systems  Constitutional: Negative.   HENT: Negative.   Respiratory: Negative.   Cardiovascular: Negative.   Gastrointestinal: Negative.   Endocrine: Negative.   Genitourinary: Negative.   Musculoskeletal: Positive for back pain and myalgias. Negative for  arthralgias, gait problem, joint swelling, neck pain and neck stiffness.  Skin: Negative.   Hematological: Negative.   Psychiatric/Behavioral: Negative.      Physical Exam Triage Vital Signs ED Triage Vitals  Enc Vitals Group     BP 07/26/18 1057 (!) 171/102     Pulse Rate 07/26/18 1057 60     Resp 07/26/18 1057 16     Temp 07/26/18 1057 98.2 F (36.8 C)     Temp Source 07/26/18 1057 Oral     SpO2 07/26/18 1057 97 %     Weight --      Height --      Head Circumference --      Peak Flow --      Pain Score 07/26/18 1054 5     Pain Loc --      Pain Edu? --      Excl. in [place]? --    No data found.  Updated Vital Signs BP (!) 171/102 Comment: has not taken BP meds yet  Pulse 60   Temp 98.2 F (36.8 C) (Oral)   Resp 16   SpO2 97%   Visual Acuity Right Eye Distance:   Left Eye Distance:   Bilateral Distance:    Right Eye Near:   Left Eye Near:    Bilateral Near:     Physical Exam Constitutional:      General: She is not in acute distress.    Appearance: Normal appearance. She is not ill-appearing.  HENT:     Mouth/Throat:     Mouth: Mucous membranes are moist.  Abdominal:     General: Bowel sounds are normal.     Palpations: Abdomen is soft.  Musculoskeletal: Normal range of motion.        General: Tenderness present. No signs of injury.     Comments: Tenderness on palpation over the right paraspinal muscles.  Full range of motion of the lumbar spine.  Skin:    General: Skin is warm.     Capillary Refill: Capillary refill takes less than 2 seconds.     Findings: No bruising, erythema, lesion or rash.  Neurological:     General: No focal deficit present.     Mental Status: She is alert and oriented to person, place, and time.     Motor: No weakness.     Gait: Gait normal.     Deep Tendon Reflexes: Reflexes  normal.      UC Treatments / Results  Labs (all labs ordered are listed, but only abnormal results are displayed) Labs Reviewed - No data to  display  EKG None  Radiology No results found.  Procedures Procedures (including critical care time)  Medications Ordered in UC Medications - No data to display  Initial Impression / Assessment and Plan / UC Course  I have reviewed the triage vital signs and the nursing notes.  Pertinent labs & imaging results that were available during my care of the patient were reviewed by me and considered in my medical decision making (see chart for details).     1.  Right paraspinal muscle sprain: Continue muscle relaxants Gentle stretches Tylenol extra strength Warm compress  2. Hypertensive urgency: Patient has no taken antihypertensive medications today Patient has a headache but no blurry vision, numbness or tingling or extremity weakness.  Final Clinical Impressions(s) / UC Diagnoses   Final diagnoses:  Muscle spasm of back   Discharge Instructions   None    ED Prescriptions    None     Controlled Substance Prescriptions [place] Controlled Substance Registry consulted? No   Chase Picket, MD 07/26/18 1130    Chase Picket, MD 07/26/18 [phone]

## 2018-08-06 DIAGNOSIS — M1990 Unspecified osteoarthritis, unspecified site: Secondary | ICD-10-CM | POA: Diagnosis not present

## 2018-08-06 DIAGNOSIS — I10 Essential (primary) hypertension: Secondary | ICD-10-CM | POA: Diagnosis not present

## 2018-08-06 DIAGNOSIS — R7303 Prediabetes: Secondary | ICD-10-CM | POA: Diagnosis not present

## 2018-08-21 ENCOUNTER — Encounter (HOSPITAL_COMMUNITY): Payer: Self-pay

## 2018-08-21 ENCOUNTER — Other Ambulatory Visit: Payer: Self-pay

## 2018-08-21 ENCOUNTER — Ambulatory Visit (HOSPITAL_COMMUNITY)
Admission: EM | Admit: 2018-08-21 | Discharge: 2018-08-21 | Disposition: A | Discharge status: Home / Self Care | Payer: Medicare Other | Attending: Family Medicine | Admitting: Family Medicine

## 2018-08-21 DIAGNOSIS — Z202 Contact with and (suspected) exposure to infections with a predominantly sexual mode of transmission: Secondary | ICD-10-CM

## 2018-08-21 MED ORDER — AZITHROMYCIN 250 MG PO TABS
1000.0000 mg | ORAL_TABLET | Freq: Once | ORAL | Status: AC
  Administered 2018-08-21: 1000 mg via ORAL

## 2018-08-21 MED ORDER — AZITHROMYCIN 250 MG PO TABS
ORAL_TABLET | ORAL | Status: AC
  Filled 2018-08-21: qty 4

## 2018-08-21 NOTE — ED Provider Notes (Signed)
[facility]    CSN: [id number] Arrival date & time: 08/21/18  5366     History   Chief Complaint Chief Complaint  Patient presents with  . Exposure to STD    HPI Michelle Williams is a 58 y.o. female.   Pt is a 58 year old female that presents for exposure to chlamydia. She was told by her partner yesterday that he was positive for chlamydia. She denies any vaginal discharge, itching, irritation, pelvic pain , back pain, fever, dysuria, hematuria, urinary frequency. She otherwise feels fine and would like treatment.  She is post menopausal.   ROS per HPI      Past Medical History:  Diagnosis Date  . Allergy   . Anemia   . Anxiety   . Arthritis   . Cervical cancer ([place])   . H/O degenerative disc disease   . H/O rheumatoid arthritis   . Hypertension     Patient Active Problem List   Diagnosis Date Noted  . Anxiety 05/09/2014  . Chronic pain syndrome 05/09/2014  . Degenerative disc disease, cervical 05/09/2014    Past Surgical History:  Procedure Laterality Date  . BREAST EXCISIONAL BIOPSY Right   . IR FALLOPIAN TUBE CATHETERIZATION      OB History    Gravida  1   Para      Term      Preterm      AB  1   Living        SAB  1   TAB      Ectopic      Multiple      Live Births               Home Medications    Prior to Admission medications   Medication Sig Start Date End Date Taking? Authorizing Provider  cetirizine (ZYRTEC) 10 MG tablet TK 1 T PO  ONCE D 05/08/18  Yes [provider]  cyclobenzaprine (FLEXERIL) 10 MG tablet Take 10 mg by mouth at bedtime as needed. 12/12/16  Yes [provider]  gabapentin (NEURONTIN) 300 MG capsule Take 300 mg by mouth 2 (two) times daily.   Yes [provider]  methocarbamol (ROBAXIN) 500 MG tablet Take 1 tablet (500 mg total) by mouth 2 (two) times daily. 07/22/16  Yes Nona Dell, PA-C  naproxen (NAPROSYN) 500 MG tablet Take 500 mg by mouth 2 (two)  times daily with a meal.   Yes [provider]  triamterene-hydrochlorothiazide (MAXZIDE-25) 37.5-25 MG tablet Take 1 tablet by mouth daily.   Yes [provider]  Vitamin D, Ergocalciferol, (DRISDOL) 50000 units CAPS capsule Take 50,000 Units by mouth every 7 (seven) days.   Yes [provider]  diphenhydrAMINE (BENADRYL) 25 mg capsule Take 1 capsule (25 mg total) by mouth at bedtime. 02/20/17   Glyn Ade, PA-C  ibuprofen (ADVIL,MOTRIN) 600 MG tablet Take 1 tablet (600 mg total) by mouth every 6 (six) hours as needed. 07/22/16   Nona Dell, PA-C  loratadine (CLARITIN) 10 MG tablet Take 10 mg by mouth daily.    [provider]    Family History Family History  Problem Relation Age of Onset  . Hypertension Mother   . Stroke Mother   . Diabetes Maternal Grandmother   . Colon cancer Brother        unsure age when diagnosed   . Colon polyps Neg Hx   . Esophageal cancer Neg Hx   .  Rectal cancer Neg Hx   . Stomach cancer Neg Hx     Social History Social History   Tobacco Use  . Smoking status: Current Every Day Smoker    Packs/day: 1.00    Types: Cigarettes  . Smokeless tobacco: Never Used  Substance Use Topics  . Alcohol use: No  . Drug use: No     Allergies   Patient has no known allergies.   Review of Systems Review of Systems   Physical Exam Triage Vital Signs ED Triage Vitals  Enc Vitals Group     BP 08/21/18 0957 (!) 145/87     Pulse Rate 08/21/18 0957 72     Resp 08/21/18 0957 20     Temp 08/21/18 0957 97.9 F (36.6 C)     Temp src --      SpO2 08/21/18 0957 99 %     Weight --      Height --      Head Circumference --      Peak Flow --      Pain Score 08/21/18 0954 0     Pain Loc --      Pain Edu? --      Excl. in [place]? --    No data found.  Updated Vital Signs BP (!) 145/87   Pulse 72   Temp 97.9 F (36.6 C)   Resp 20   SpO2 99%   Visual Acuity Right Eye Distance:   Left Eye Distance:    Bilateral Distance:    Right Eye Near:   Left Eye Near:    Bilateral Near:     Physical Exam Vitals signs and nursing note reviewed.  Constitutional:      General: She is not in acute distress.    Appearance: Normal appearance. She is not ill-appearing, toxic-appearing or diaphoretic.  HENT:     Head: Normocephalic.     Nose: Nose normal.     Mouth/Throat:     Pharynx: Oropharynx is clear.  Eyes:     Conjunctiva/sclera: Conjunctivae normal.  Neck:     Musculoskeletal: Normal range of motion.  Pulmonary:     Effort: Pulmonary effort is normal.  Abdominal:     Palpations: Abdomen is soft.     Tenderness: There is no abdominal tenderness.  Musculoskeletal: Normal range of motion.  Skin:    General: Skin is warm and dry.     Findings: No rash.  Neurological:     Mental Status: She is alert.  Psychiatric:        Mood and Affect: Mood normal.      UC Treatments / Results  Labs (all labs ordered are listed, but only abnormal results are displayed) Labs Reviewed  CERVICOVAGINAL ANCILLARY ONLY    EKG None  Radiology No results found.  Procedures Procedures (including critical care time)  Medications Ordered in UC Medications  azithromycin (ZITHROMAX) tablet 1,000 mg (1,000 mg Oral Given 08/21/18 1036)    Initial Impression / Assessment and Plan / UC Course  I have reviewed the triage vital signs and the nursing notes.  Pertinent labs & imaging results that were available during my care of the patient were reviewed by me and considered in my medical decision making (see chart for details).     STD exposure- treating for chlamydia here in the clinic based on exposure.  Sending self swab for testing.  Labs pending.  Final Clinical Impressions(s) / UC Diagnoses   Final diagnoses:  None  Discharge Instructions     We are treating you for chlamydia based on exposure.  Sending the swab for testing and will call with any positive results.  Follow up  as needed for continued or worsening symptoms     ED Prescriptions    None     Controlled Substance Prescriptions [place] Controlled Substance Registry consulted? Not Applicable   Orvan July, NP 08/21/18 1059

## 2018-08-21 NOTE — Discharge Instructions (Addendum)
We are treating you for chlamydia based on exposure.  Sending the swab for testing and will call with any positive results.  Follow up as needed for continued or worsening symptoms

## 2018-08-21 NOTE — ED Triage Notes (Signed)
States that sex partner tested positive fo chlamydia

## 2018-08-22 LAB — CERVICOVAGINAL ANCILLARY ONLY
Chlamydia: POSITIVE — AB
Neisseria Gonorrhea: NEGATIVE
Trichomonas: NEGATIVE

## 2018-08-23 ENCOUNTER — Telehealth (HOSPITAL_COMMUNITY): Payer: Self-pay | Admitting: Emergency Medicine

## 2018-08-23 NOTE — Telephone Encounter (Signed)
Chlamydia is positive.  This was treated at the urgent care visit with po zithromax 1g.  Pt needs education to please refrain from sexual intercourse for 7 days to give the medicine time to work.  Sexual partners need to be notified and tested/treated.  Condoms may reduce risk of reinfection.  Recheck or followup with PCP for further evaluation if symptoms are not improving.  [facility] notified.  Attempted to reach patient. No answer at this time. Voicemail left.

## 2018-08-27 ENCOUNTER — Telehealth (HOSPITAL_COMMUNITY): Payer: Self-pay | Admitting: Emergency Medicine

## 2018-08-27 NOTE — Telephone Encounter (Signed)
Patient contacted and made aware of all results, all questions answered.   

## 2018-09-08 ENCOUNTER — Encounter (HOSPITAL_COMMUNITY): Payer: Self-pay | Admitting: Emergency Medicine

## 2018-09-08 ENCOUNTER — Ambulatory Visit (HOSPITAL_COMMUNITY)
Admission: EM | Admit: 2018-09-08 | Discharge: 2018-09-08 | Disposition: A | Discharge status: Home / Self Care | Payer: Medicare Other | Attending: Family Medicine | Admitting: Family Medicine

## 2018-09-08 DIAGNOSIS — I10 Essential (primary) hypertension: Secondary | ICD-10-CM

## 2018-09-08 DIAGNOSIS — N939 Abnormal uterine and vaginal bleeding, unspecified: Secondary | ICD-10-CM | POA: Diagnosis not present

## 2018-09-08 LAB — POCT URINALYSIS DIP (DEVICE)
Bilirubin Urine: NEGATIVE
Glucose, UA: NEGATIVE mg/dL
Hgb urine dipstick: NEGATIVE
Ketones, ur: NEGATIVE mg/dL
Leukocytes,Ua: NEGATIVE
Nitrite: NEGATIVE
Protein, ur: NEGATIVE mg/dL
Specific Gravity, Urine: >=1.03 (ref 1.005–1.030)
Urobilinogen, UA: 0.2 mg/dL (ref 0.0–1.0)
pH: 6.5 (ref 5.0–8.0)

## 2018-09-08 LAB — POCT PREGNANCY, URINE: Preg Test, Ur: NEGATIVE

## 2018-09-08 NOTE — ED Triage Notes (Signed)
Pt states she was recently treated for chlamydia, states shes been with the same partner who also was treated appropriately, , states the last week shes had some cramping and spotting. Here to follow up.

## 2018-09-10 LAB — CERVICOVAGINAL ANCILLARY ONLY
Chlamydia: NEGATIVE
Neisseria Gonorrhea: NEGATIVE

## 2018-09-12 NOTE — ED Provider Notes (Signed)
[facility]   [id number] 09/08/18 Arrival Time: 5701  ASSESSMENT & PLAN:  1. Vaginal spotting   2. Essential hypertension    Follow-up Information    [facility].   Contact information: [place] [phone]         She agrees to call and schedule evaluation asap.  Vaginal cytology pending. U/A negative. UPT negative.  Will notify of any positive results. Instructed to refrain from sexual activity for at least seven days.  Reviewed expectations re: course of current medical issues. Questions answered. Outlined signs and symptoms indicating need for more acute intervention. Patient verbalized understanding. After Visit Summary given.   SUBJECTIVE:  Michelle Williams is a 58 y.o. female who presents with complaint of vaginal spotting. No LMP recorded. Patient is postmenopausal. "Many years ago." No vaginal discharge or specific urinary symptoms. No gross hematuria reported. First noticed spotting several days ago. No specific aggravating or alleviating factors reported. Occasional "cramping" in pelvis but has not felt this in several days. No specific abdominal pain. Normal PO intake without n/v. Sexually active with one female partner; with regular condom use. Both she and her partner recently treated for chlamydia. Reports vague h/o "some problem with my cervix"; cannot elaborate. No regular PCP/Gyn followup.  She also notes elevated BP here. Reports that she has not taken her BP med today.  ROS: As per HPI. All other systems negative.   OBJECTIVE:  Vitals:   09/08/18 1031  BP: (!) 175/104  Pulse: 68  Resp: 16  Temp: 97.8 F (36.6 C)  SpO2: 99%    General appearance: alert, cooperative, appears stated age and no distress Throat: lips, mucosa, and tongue normal; teeth and gums normal CV: RRR Lungs: CTAB Back: no CVA tenderness; FROM at waist Abdomen: soft, non-tender; normal bowel sounds; no  guarding or rebound tenderness GU: declines today (prefers to wait until she sees Gyn) Ext: no LE swelling Skin: warm and dry Psychological: alert and cooperative; normal mood and affect.  Results for orders placed or performed during the hospital encounter of 09/08/18  POCT urinalysis dip (device)  Result Value Ref Range   Glucose, UA NEGATIVE NEGATIVE mg/dL   Bilirubin Urine NEGATIVE NEGATIVE   Ketones, ur NEGATIVE NEGATIVE mg/dL   Specific Gravity, Urine >=1.030 1.005 - 1.030   Hgb urine dipstick NEGATIVE NEGATIVE   pH 6.5 5.0 - 8.0   Protein, ur NEGATIVE NEGATIVE mg/dL   Urobilinogen, UA 0.2 0.0 - 1.0 mg/dL   Nitrite NEGATIVE NEGATIVE   Leukocytes,Ua NEGATIVE NEGATIVE  Pregnancy, urine POC  Result Value Ref Range   Preg Test, Ur NEGATIVE NEGATIVE     No Known Allergies  Past Medical History:  Diagnosis Date  . Allergy   . Anemia   . Anxiety   . Arthritis   . Cervical cancer ([place])   . H/O degenerative disc disease   . H/O rheumatoid arthritis   . Hypertension    Family History  Problem Relation Age of Onset  . Hypertension Mother   . Stroke Mother   . Diabetes Maternal Grandmother   . Colon cancer Brother        unsure age when diagnosed   . Colon polyps Neg Hx   . Esophageal cancer Neg Hx   . Rectal cancer Neg Hx   . Stomach cancer Neg Hx    Social History   Socioeconomic History  . Marital status: Single    Spouse name: Not  on file  . Number of children: Not on file  . Years of education: Not on file  . Highest education level: Not on file  Occupational History  . Not on file  Social Needs  . Financial resource strain: Not on file  . Food insecurity:    Worry: Not on file    Inability: Not on file  . Transportation needs:    Medical: Not on file    Non-medical: Not on file  Tobacco Use  . Smoking status: Current Every Day Smoker    Packs/day: 1.00    Types: Cigarettes  . Smokeless tobacco: Never Used  Substance and Sexual Activity  .  Alcohol use: No  . Drug use: No  . Sexual activity: Yes    Birth control/protection: None  Lifestyle  . Physical activity:    Days per week: Not on file    Minutes per session: Not on file  . Stress: Not on file  Relationships  . Social connections:    Talks on phone: Not on file    Gets together: Not on file    Attends religious service: Not on file    Active member of club or organization: Not on file    Attends meetings of clubs or organizations: Not on file    Relationship status: Not on file  . Intimate partner violence:    Fear of current or ex partner: Not on file    Emotionally abused: Not on file    Physically abused: Not on file    Forced sexual activity: Not on file  Other Topics Concern  . Not on file  Social History Narrative  . Not on file          Vanessa Kick, MD 09/12/18 [phone]

## 2018-10-24 DIAGNOSIS — Z20828 Contact with and (suspected) exposure to other viral communicable diseases: Secondary | ICD-10-CM | POA: Diagnosis not present

## 2018-12-21 DIAGNOSIS — D649 Anemia, unspecified: Secondary | ICD-10-CM | POA: Diagnosis not present

## 2018-12-21 DIAGNOSIS — Z0000 Encounter for general adult medical examination without abnormal findings: Secondary | ICD-10-CM | POA: Diagnosis not present

## 2018-12-21 DIAGNOSIS — Z79899 Other long term (current) drug therapy: Secondary | ICD-10-CM | POA: Diagnosis not present

## 2018-12-21 DIAGNOSIS — E559 Vitamin D deficiency, unspecified: Secondary | ICD-10-CM | POA: Diagnosis not present

## 2019-01-18 DIAGNOSIS — Z20828 Contact with and (suspected) exposure to other viral communicable diseases: Secondary | ICD-10-CM | POA: Diagnosis not present

## 2019-01-23 IMAGING — DX DG LUMBAR SPINE COMPLETE 4+V
5 series · 5 of 5 positions shown · non-contrast
Comparison: 02/19/2015.

CLINICAL DATA: Low back pain.  No injury.

EXAM:
LUMBAR SPINE - COMPLETE 4+ VIEW

[t lumbar spine ap]
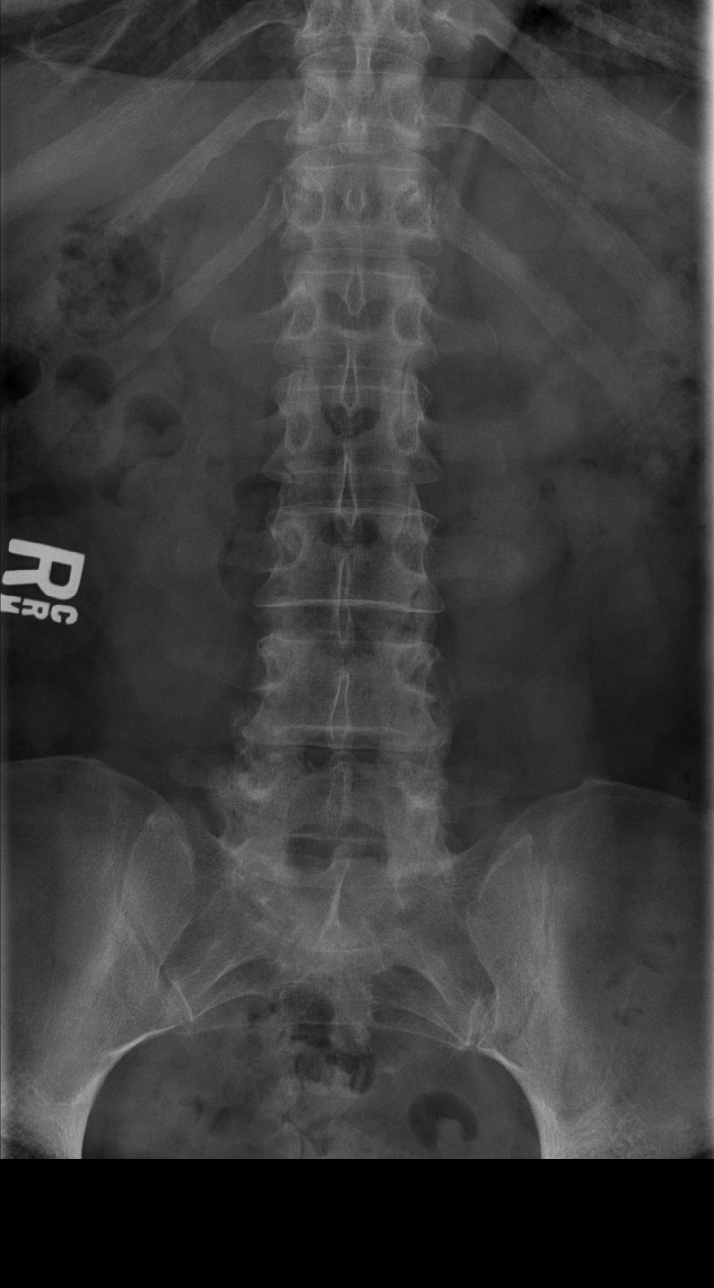

[t lumbar spine obl (1 of 2)]
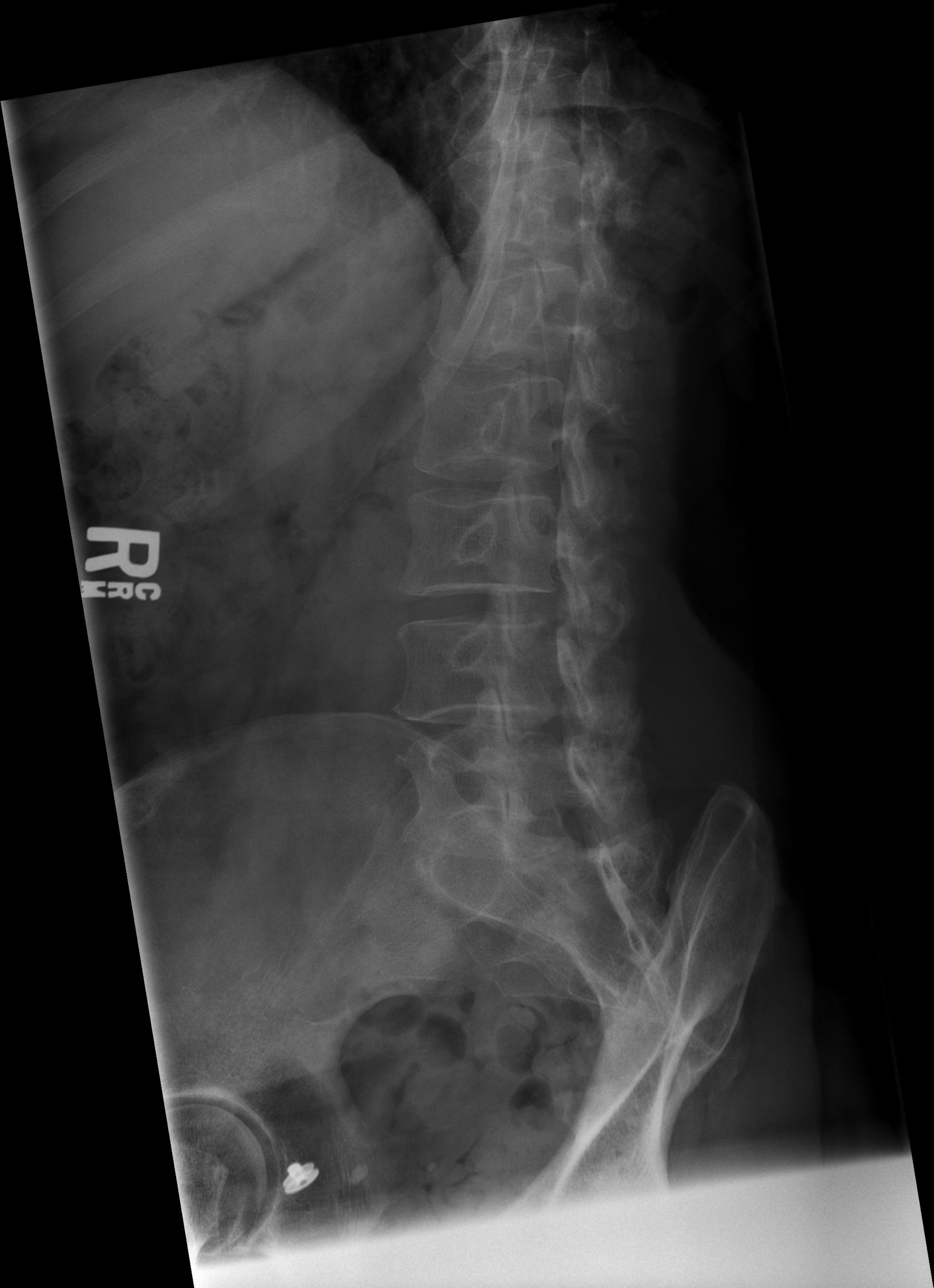

[t lumbar spine obl (2 of 2)]
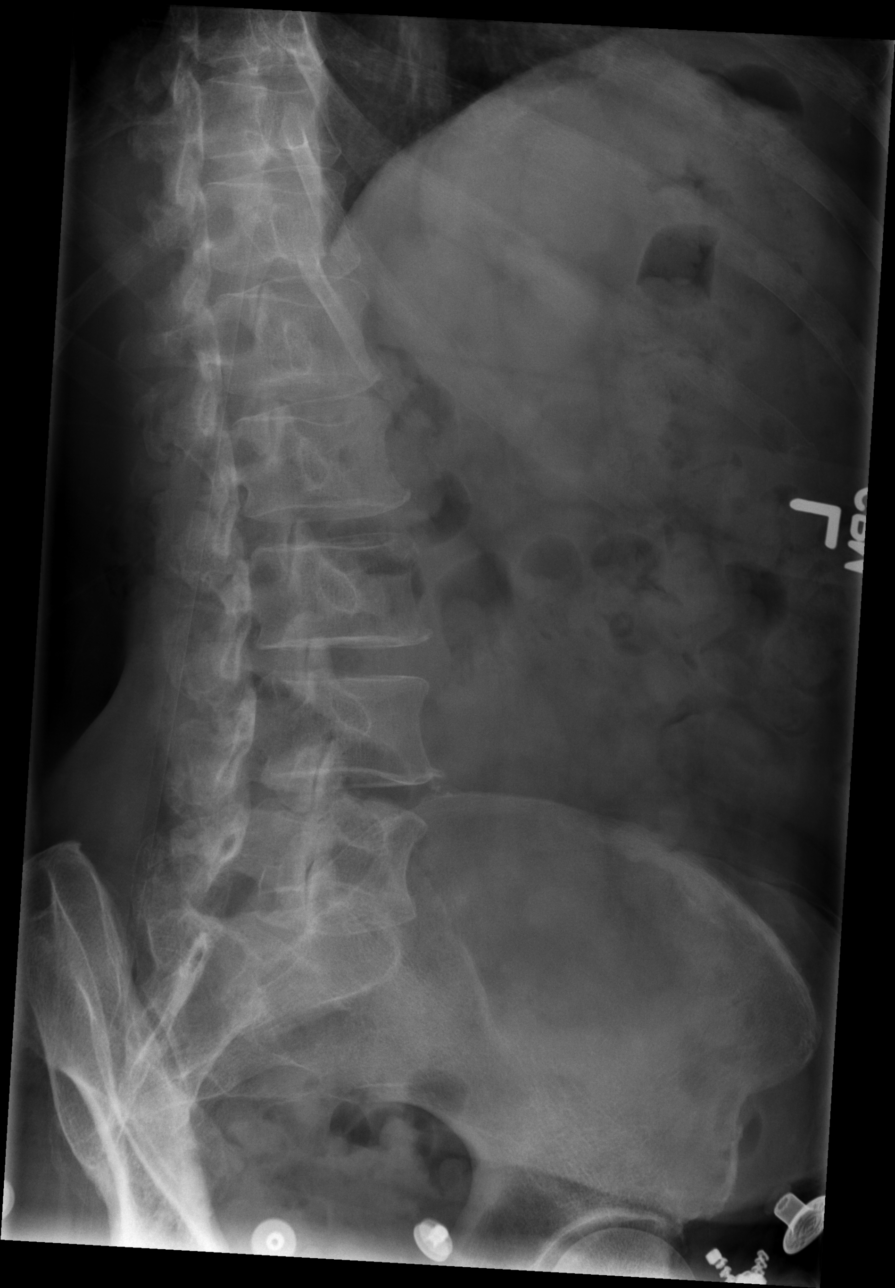

[t lumbar spine lat]
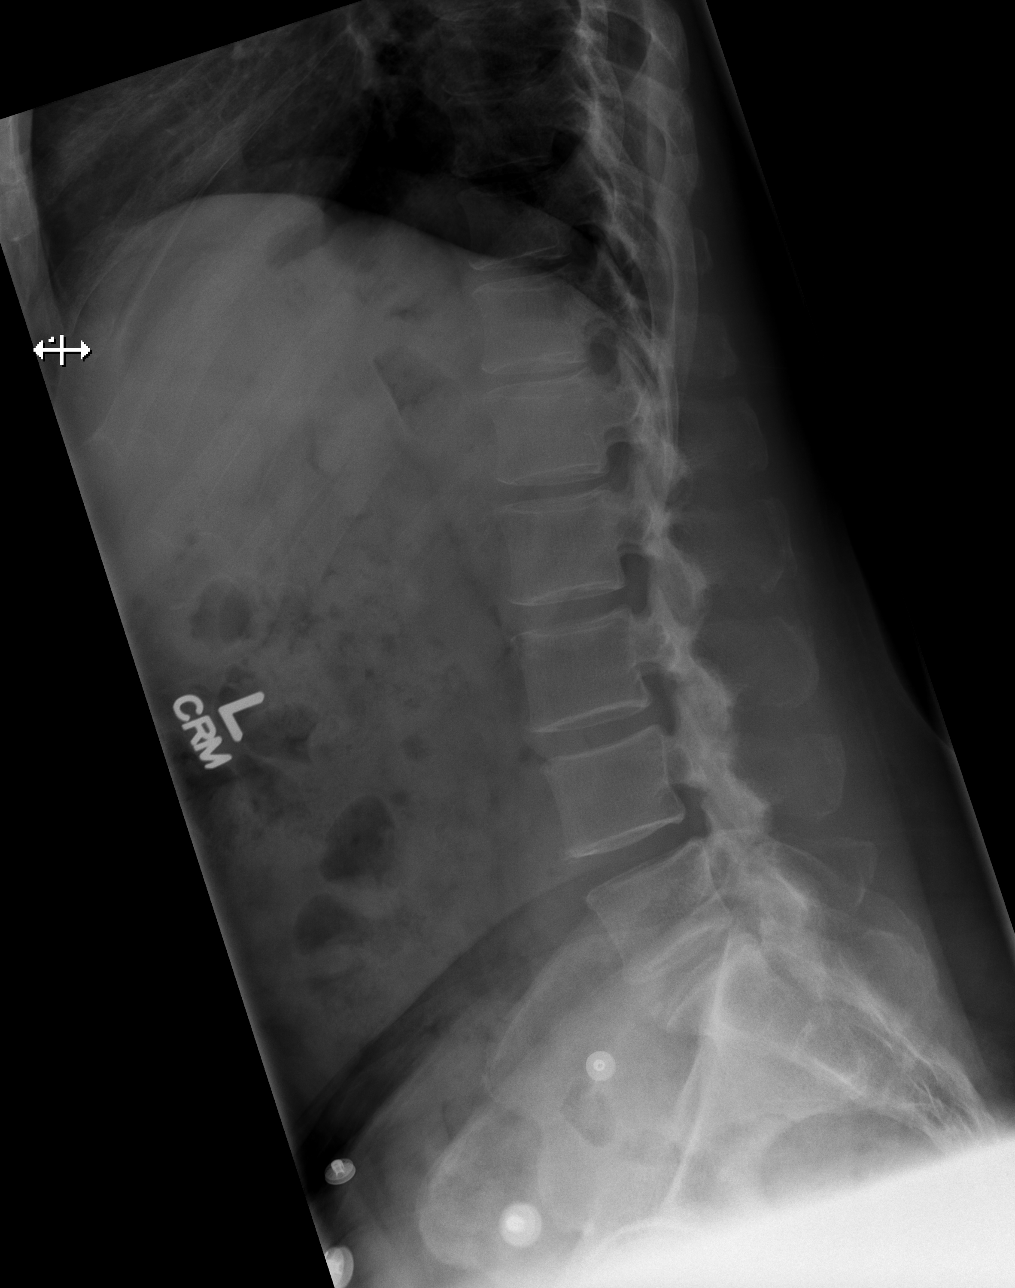

[t lumbar l-5 s-1 spot]
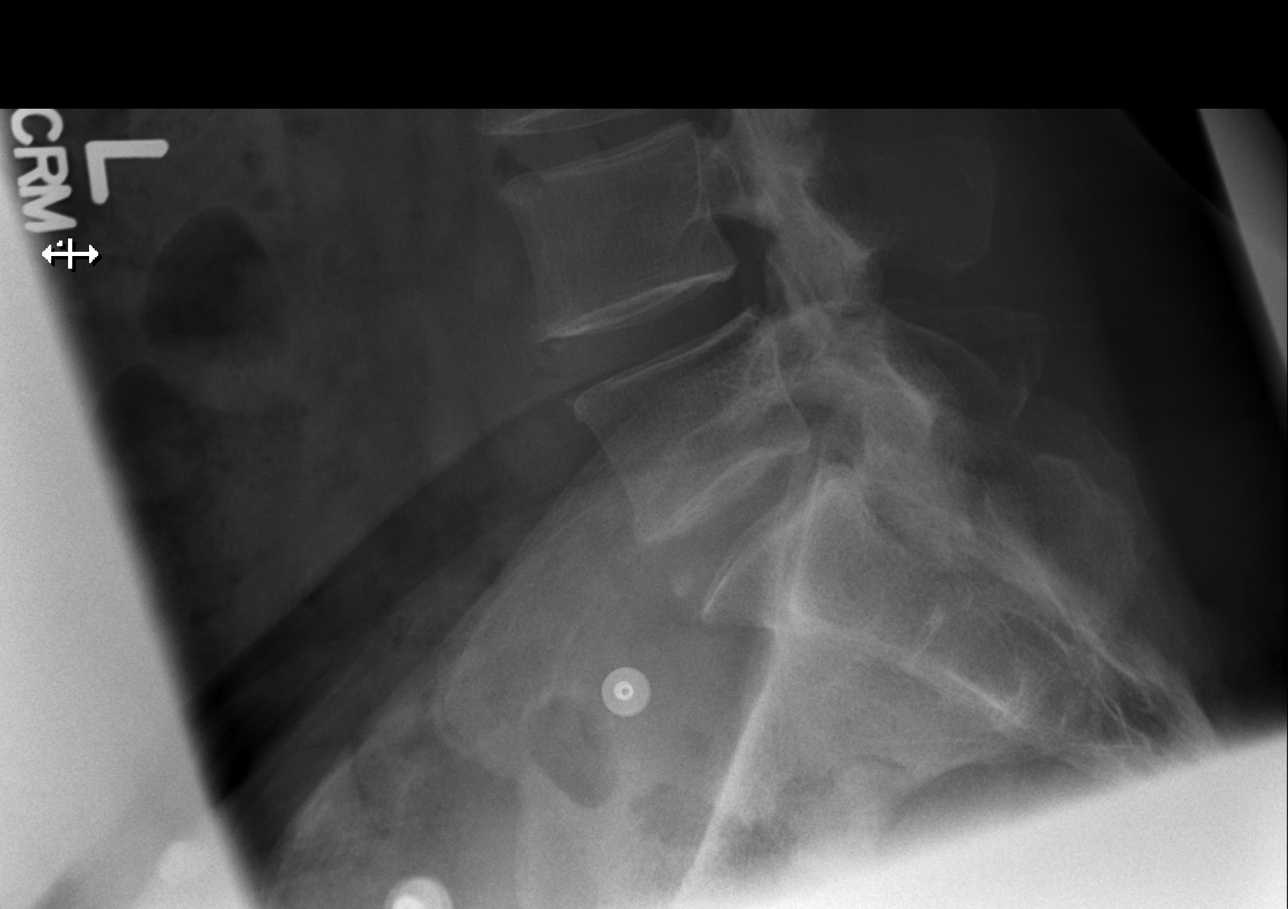

[5 of 5 positions shown; findings below may reference images not displayed]

FINDINGS: Diffuse mild degenerative change. No acute abnormality identified.
No evidence of fracture.
IMPRESSION: Diffuse mild degenerative change.  No acute abnormality.

## 2019-03-19 DIAGNOSIS — Z20828 Contact with and (suspected) exposure to other viral communicable diseases: Secondary | ICD-10-CM | POA: Diagnosis not present

## 2019-04-09 ENCOUNTER — Other Ambulatory Visit: Payer: Self-pay | Admitting: Family Medicine

## 2019-04-09 DIAGNOSIS — N6320 Unspecified lump in the left breast, unspecified quadrant: Secondary | ICD-10-CM

## 2019-05-30 ENCOUNTER — Other Ambulatory Visit: Payer: Medicare Other

## 2019-06-19 ENCOUNTER — Other Ambulatory Visit: Payer: Medicare Other

## 2019-08-01 ENCOUNTER — Other Ambulatory Visit: Payer: Medicare Other

## 2019-12-04 ENCOUNTER — Ambulatory Visit
Admission: RE | Admit: 2019-12-04 | Discharge: 2019-12-04 | Disposition: A | Discharge status: Home / Self Care | Payer: Medicare Other | Source: Ambulatory Visit | Attending: Family | Admitting: Family

## 2019-12-04 ENCOUNTER — Other Ambulatory Visit: Payer: Self-pay

## 2019-12-04 DIAGNOSIS — N6320 Unspecified lump in the left breast, unspecified quadrant: Secondary | ICD-10-CM

## 2020-10-30 NOTE — Progress Notes (Signed)
Office Visit Note  Patient: Michelle Williams             Date of Birth: [birth year]           MRN: [id number]             PCP: Rogers Blocker, MD Referring: Andree Moro, DO Visit Date: 11/09/2020 Occupation: @GUAROCC @  Subjective:  Pain in multiple joints.   History of Present Illness: Michelle Williams is a 60 y.o. female in consultation per request of her PCP.  According to the patient she played softball when she was young.  She has had problems with her shoulder joints for many years.  She had been recently seeing an orthopedic surgeon who gave her cortisone injection to her shoulder joint and also told her that she may have a rotator cuff tear.  She states about 2 weeks ago her right shoulder joint popped and she has not gone back to the orthopedic surgeon yet.  She has been having discomfort in her both hands for the last 5 years and decreased grip strength.  She also have some discomfort in her hips and her knees.  She notices swelling in her hands.  She is gravida 1, para 0, miscarriage 1  Activities of Daily Living:  Patient reports morning stiffness for 20 minutes.   Patient Reports nocturnal pain.  Difficulty dressing/grooming: Denies Difficulty climbing stairs: Reports Difficulty getting out of chair: Reports Difficulty using hands for taps, buttons, cutlery, and/or writing: Reports  Review of Systems  Constitutional:  Positive for fatigue.  HENT:  Positive for mouth dryness. Negative for mouth sores and nose dryness.   Eyes:  Negative for pain, itching and dryness.  Respiratory:  Negative for shortness of breath and difficulty breathing.   Cardiovascular:  Negative for chest pain and palpitations.  Gastrointestinal:  Negative for blood in stool, constipation and diarrhea.  Endocrine: Negative for increased urination.  Genitourinary:  Negative for difficulty urinating.  Musculoskeletal:  Positive for joint pain, joint pain, joint swelling, myalgias, morning stiffness, muscle  tenderness and myalgias.  Skin:  Negative for color change, rash, redness and sensitivity to sunlight.  Allergic/Immunologic: Negative for susceptible to infections.  Neurological:  Positive for numbness. Negative for dizziness, headaches, memory loss and weakness.  Hematological:  Positive for bruising/bleeding tendency. Negative for swollen glands.  Psychiatric/Behavioral:  Positive for sleep disturbance. Negative for depressed mood and confusion. The patient is nervous/anxious.    PMFS History:  Patient Active Problem List   Diagnosis Date Noted   Anxiety 05/09/2014   Chronic pain syndrome 05/09/2014   Degenerative disc disease, cervical 05/09/2014    Past Medical History:  Diagnosis Date   Allergy    Anemia    Anxiety    Arthritis    Cervical cancer ([place])    H/O degenerative disc disease    H/O rheumatoid arthritis    Hypertension     Family History  Problem Relation Age of Onset   Hypertension Mother    Stroke Mother    Early death Father    Colon cancer Brother        unsure age when diagnosed    HIV/AIDS Brother    Diabetes Maternal Grandmother    Colon polyps Neg Hx    Esophageal cancer Neg Hx    Rectal cancer Neg Hx    Stomach cancer Neg Hx    Past Surgical History:  Procedure Laterality Date   BREAST EXCISIONAL BIOPSY Right    IR  FALLOPIAN TUBE CATHETERIZATION     Social History   Social History Narrative   Not on file    There is no immunization history on file for this patient.   Objective: Vital Signs: BP (!) 153/93 (BP Location: Right Arm, Patient Position: Sitting, Cuff Size: Normal)   Pulse 73   Ht 5\' 4"  (1.626 m)   Wt 165 lb 12.8 oz (75.2 kg)   BMI 28.46 kg/m    Physical Exam Vitals and nursing note reviewed.  Constitutional:      Appearance: She is well-developed.  HENT:     Head: Normocephalic and atraumatic.  Eyes:     Conjunctiva/sclera: Conjunctivae normal.  Cardiovascular:     Rate and Rhythm: Normal rate and regular rhythm.      Heart sounds: Normal heart sounds.  Pulmonary:     Effort: Pulmonary effort is normal.     Breath sounds: Normal breath sounds.  Abdominal:     General: Bowel sounds are normal.     Palpations: Abdomen is soft.  Musculoskeletal:     Cervical back: Normal range of motion.  Lymphadenopathy:     Cervical: No cervical adenopathy.  Skin:    General: Skin is warm and dry.     Capillary Refill: Capillary refill takes less than 2 seconds.  Neurological:     Mental Status: She is alert and oriented to person, place, and time.  Psychiatric:        Behavior: Behavior normal.     Musculoskeletal Exam: C-spine was in good range of motion.  She had painful limited range of motion of bilateral shoulders.  Right biceps rupture with bruising on her right arm was noted.  Elbow joints and wrist joints with good range of motion.  She had bilateral PIP and DIP thickening with no synovitis.  No MCP synovitis was noted.  Hip joints and knee joints with good range of motion.  She had no tenderness over ankles or MTPs.  CDAI Exam: CDAI Score: -- Patient Global: --; Provider Global: -- Swollen: --; Tender: -- Joint Exam 11/09/2020   No joint exam has been documented for this visit   There is currently no information documented on the homunculus. Go to the Rheumatology activity and complete the homunculus joint exam.  Investigation: No additional findings.  Imaging: No results found.  Recent Labs: Lab Results  Component Value Date   WBC 6.5 11/16/2014   HGB 12.6 11/16/2014   PLT 265 11/16/2014   NA 139 11/16/2014   K 3.5 11/16/2014   CL 108 11/16/2014   CO2 26 11/16/2014   GLUCOSE 93 11/16/2014   BUN 12 11/16/2014   CREATININE 0.72 11/16/2014   BILITOT <0.2 05/21/2018   ALKPHOS 90 05/21/2018   AST 17 05/21/2018   ALT 16 05/21/2018   PROT 6.9 05/21/2018   ALBUMIN 4.2 05/21/2018   CALCIUM 8.7 (L) 11/16/2014   GFRAA >60 11/16/2014    Speciality Comments: No specialty comments  available.  Procedures:  No procedures performed Allergies: Patient has no known allergies.   Assessment / Plan:     Visit Diagnoses: Polyarthralgia -patient gives history of pain and discomfort in multiple joints for last many years.  She states with her previous PCP thought that she may have rheumatoid arthritis.  She complains of pain and discomfort in multiple joints.  Chronic pain of both shoulders-she has limited painful range of motion of her shoulder joints.  She states she has been followed by orthopedics.  Rupture of  right biceps tendon, initial encounter-she noticed sudden pop in her right shoulder about 2 weeks ago.  She had a biceps rupture with some bruising.  She has appointment coming up with the orthopedic surgeon.  Pain in both hands -she complains of discomfort in her bilateral hands.  DIP and PIP thickening was noted.  No synovitis was noted.  Plan: XR Hand 2 View Right, XR Hand 2 View Left, x-ray of bilateral hands were consistent with osteoarthritis.  Sedimentation rate, Uric acid, Rheumatoid factor, Cyclic citrul peptide antibody, IgG.  Joint protection was discussed.  A handout on hand exercises was given.  Chronic pain of both knees-she complains of pain and discomfort in her bilateral knee joints off and on.  She is no warmth swelling or effusion was noted today.  A handout on knee joint exercises was given.  Primary osteoarthritis of both feet-she gives history of arthritis in her feet.  I reviewed her previous x-rays from 2018 which showed osteoarthritic changes.  No synovitis was noted.  Degenerative disc disease, cervical-she has chronic neck pain.  DDD (degenerative disc disease), lumbar-she continues to have some discomfort in her lower back.  Essential hypertension-blood pressure is elevated at 153/93.  History of COPD  Smoker - 1PPD x 50years-patient reports that she has never had CT scan of her chest.  I have advised her to discuss this further with her  PCP.  Chronic pain syndrome  Anxiety  Other insomnia  Orders: Orders Placed This Encounter  Procedures   XR Hand 2 View Right   XR Hand 2 View Left   Sedimentation rate   Uric acid   Rheumatoid factor   Cyclic citrul peptide antibody, IgG    No orders of the defined types were placed in this encounter.   Follow-Up Instructions: Return for Pain in multiple joints.   Bo Merino, MD  Note - This record has been created using Editor, commissioning.  Chart creation errors have been sought, but may not always  have been located. Such creation errors do not reflect on  the standard of medical care.

## 2020-11-05 ENCOUNTER — Other Ambulatory Visit: Payer: Self-pay | Admitting: Family

## 2020-11-05 DIAGNOSIS — Z1231 Encounter for screening mammogram for malignant neoplasm of breast: Secondary | ICD-10-CM

## 2020-11-09 ENCOUNTER — Encounter: Payer: Self-pay | Admitting: Rheumatology

## 2020-11-09 ENCOUNTER — Other Ambulatory Visit: Payer: Self-pay

## 2020-11-09 ENCOUNTER — Ambulatory Visit: Payer: Self-pay

## 2020-11-09 ENCOUNTER — Ambulatory Visit (INDEPENDENT_AMBULATORY_CARE_PROVIDER_SITE_OTHER): Payer: Medicare Other | Admitting: Rheumatology

## 2020-11-09 VITALS — BP 153/93 | HR 73 | Ht 64.0 in | Wt 165.8 lb

## 2020-11-09 DIAGNOSIS — I10 Essential (primary) hypertension: Secondary | ICD-10-CM

## 2020-11-09 DIAGNOSIS — M79641 Pain in right hand: Secondary | ICD-10-CM | POA: Diagnosis not present

## 2020-11-09 DIAGNOSIS — M5030 Other cervical disc degeneration, unspecified cervical region: Secondary | ICD-10-CM

## 2020-11-09 DIAGNOSIS — M25511 Pain in right shoulder: Secondary | ICD-10-CM | POA: Diagnosis not present

## 2020-11-09 DIAGNOSIS — G4709 Other insomnia: Secondary | ICD-10-CM

## 2020-11-09 DIAGNOSIS — M5136 Other intervertebral disc degeneration, lumbar region: Secondary | ICD-10-CM

## 2020-11-09 DIAGNOSIS — M79642 Pain in left hand: Secondary | ICD-10-CM

## 2020-11-09 DIAGNOSIS — M2550 Pain in unspecified joint: Secondary | ICD-10-CM | POA: Diagnosis not present

## 2020-11-09 DIAGNOSIS — Z8709 Personal history of other diseases of the respiratory system: Secondary | ICD-10-CM

## 2020-11-09 DIAGNOSIS — M25512 Pain in left shoulder: Secondary | ICD-10-CM

## 2020-11-09 DIAGNOSIS — S46211A Strain of muscle, fascia and tendon of other parts of biceps, right arm, initial encounter: Secondary | ICD-10-CM | POA: Diagnosis not present

## 2020-11-09 DIAGNOSIS — M19072 Primary osteoarthritis, left ankle and foot: Secondary | ICD-10-CM

## 2020-11-09 DIAGNOSIS — G894 Chronic pain syndrome: Secondary | ICD-10-CM

## 2020-11-09 DIAGNOSIS — M19071 Primary osteoarthritis, right ankle and foot: Secondary | ICD-10-CM

## 2020-11-09 DIAGNOSIS — F419 Anxiety disorder, unspecified: Secondary | ICD-10-CM

## 2020-11-09 DIAGNOSIS — M25561 Pain in right knee: Secondary | ICD-10-CM

## 2020-11-09 DIAGNOSIS — M25562 Pain in left knee: Secondary | ICD-10-CM

## 2020-11-09 DIAGNOSIS — F17200 Nicotine dependence, unspecified, uncomplicated: Secondary | ICD-10-CM

## 2020-11-09 DIAGNOSIS — G8929 Other chronic pain: Secondary | ICD-10-CM

## 2020-11-09 NOTE — Patient Instructions (Signed)
Journal for Nurse Practitioners, 15(4), 263-267. Retrieved February 05, 2018 from [email or web address].[email or web address]">  Knee Exercises Ask your health care provider which exercises are safe for you. Do exercises exactly as told by your health care provider and adjust them as directed. It is normal to feel mild stretching, pulling, tightness, or discomfort as you do these exercises. Stop right away if you feel sudden pain or your pain gets worse. Do not begin these exercises until told by your health care provider. Stretching and range-of-motion exercises These exercises warm up your muscles and joints and improve the movement and flexibility of your knee. These exercises also help to relieve pain andswelling. Knee extension, prone Lie on your abdomen (prone position) on a bed. Place your left / right knee just beyond the edge of the surface so your knee is not on the bed. You can put a towel under your left / right thigh just above your kneecap for comfort. Relax your leg muscles and allow gravity to straighten your knee (extension). You should feel a stretch behind your left / right knee. Hold this position for __________ seconds. Scoot up so your knee is supported between repetitions. Repeat __________ times. Complete this exercise __________ times a day. Knee flexion, active  Lie on your back with both legs straight. If this causes back discomfort, bend your left / right knee so your foot is flat on the floor. Slowly slide your left / right heel back toward your buttocks. Stop when you feel a gentle stretch in the front of your knee or thigh (flexion). Hold this position for __________ seconds. Slowly slide your left / right heel back to the starting position. Repeat __________ times. Complete this exercise __________ times a day. Quadriceps stretch, prone  Lie on your abdomen on a firm surface, such as a bed or padded floor. Bend your left / right knee and hold your ankle. If you cannot reach  your ankle or pant leg, loop a belt around your foot and grab the belt instead. Gently pull your heel toward your buttocks. Your knee should not slide out to the side. You should feel a stretch in the front of your thigh and knee (quadriceps). Hold this position for __________ seconds. Repeat __________ times. Complete this exercise __________ times a day. Hamstring, supine Lie on your back (supine position). Loop a belt or towel over the ball of your left / right foot. The ball of your foot is on the walking surface, right under your toes. Straighten your left / right knee and slowly pull on the belt to raise your leg until you feel a gentle stretch behind your knee (hamstring). Do not let your knee bend while you do this. Keep your other leg flat on the floor. Hold this position for __________ seconds. Repeat __________ times. Complete this exercise __________ times a day. Strengthening exercises These exercises build strength and endurance in your knee. Endurance is theability to use your muscles for a long time, even after they get tired. Quadriceps, isometric This exercise stretches the muscles in front of your thigh (quadriceps) without moving your knee joint (isometric). Lie on your back with your left / right leg extended and your other knee bent. Put a rolled towel or small pillow under your knee if told by your health care provider. Slowly tense the muscles in the front of your left / right thigh. You should see your kneecap slide up toward your hip or see increased dimpling just above the knee. This motion will   push the back of the knee toward the floor. For __________ seconds, hold the muscle as tight as you can without increasing your pain. Relax the muscles slowly and completely. Repeat __________ times. Complete this exercise __________ times a day. Straight leg raises This exercise stretches the muscles in front of your thigh (quadriceps) and the muscles that move your hips (hip  flexors). Lie on your back with your left / right leg extended and your other knee bent. Tense the muscles in the front of your left / right thigh. You should see your kneecap slide up or see increased dimpling just above the knee. Your thigh may even shake a bit. Keep these muscles tight as you raise your leg 4-6 inches (10-15 cm) off the floor. Do not let your knee bend. Hold this position for __________ seconds. Keep these muscles tense as you lower your leg. Relax your muscles slowly and completely after each repetition. Repeat __________ times. Complete this exercise __________ times a day. Hamstring, isometric Lie on your back on a firm surface. Bend your left / right knee about __________ degrees. Dig your left / right heel into the surface as if you are trying to pull it toward your buttocks. Tighten the muscles in the back of your thighs (hamstring) to "dig" as hard as you can without increasing any pain. Hold this position for __________ seconds. Release the tension gradually and allow your muscles to relax completely for __________ seconds after each repetition. Repeat __________ times. Complete this exercise __________ times a day. Hamstring curls If told by your health care provider, do this exercise while wearing ankle weights. Begin with __________ lb weights. Then increase the weight by 1 lb (0.5 kg) increments. Do not wear ankle weights that are more than __________ lb. Lie on your abdomen with your legs straight. Bend your left / right knee as far as you can without feeling pain. Keep your hips flat against the floor. Hold this position for __________ seconds. Slowly lower your leg to the starting position. Repeat __________ times. Complete this exercise __________ times a day. Squats This exercise strengthens the muscles in front of your thigh and knee (quadriceps). Stand in front of a table, with your feet and knees pointing straight ahead. You may rest your hands on the  table for balance but not for support. Slowly bend your knees and lower your hips like you are going to sit in a chair. Keep your weight over your heels, not over your toes. Keep your lower legs upright so they are parallel with the table legs. Do not let your hips go lower than your knees. Do not bend lower than told by your health care provider. If your knee pain increases, do not bend as low. Hold the squat position for __________ seconds. Slowly push with your legs to return to standing. Do not use your hands to pull yourself to standing. Repeat __________ times. Complete this exercise __________ times a day. Wall slides This exercise strengthens the muscles in front of your thigh and knee (quadriceps). Lean your back against a smooth wall or door, and walk your feet out 18-24 inches (46-61 cm) from it. Place your feet hip-width apart. Slowly slide down the wall or door until your knees bend __________ degrees. Keep your knees over your heels, not over your toes. Keep your knees in line with your hips. Hold this position for __________ seconds. Repeat __________ times. Complete this exercise __________ times a day. Straight leg raises This exercise   strengthens the muscles that rotate the leg at the hip and move it away from your body (hip abductors). Lie on your side with your left / right leg in the top position. Lie so your head, shoulder, knee, and hip line up. You may bend your bottom knee to help you keep your balance. Roll your hips slightly forward so your hips are stacked directly over each other and your left / right knee is facing forward. Leading with your heel, lift your top leg 4-6 inches (10-15 cm). You should feel the muscles in your outer hip lifting. Do not let your foot drift forward. Do not let your knee roll toward the ceiling. Hold this position for __________ seconds. Slowly return your leg to the starting position. Let your muscles relax completely after each  repetition. Repeat __________ times. Complete this exercise __________ times a day. Straight leg raises This exercise stretches the muscles that move your hips away from the front of the pelvis (hip extensors). Lie on your abdomen on a firm surface. You can put a pillow under your hips if that is more comfortable. Tense the muscles in your buttocks and lift your left / right leg about 4-6 inches (10-15 cm). Keep your knee straight as you lift your leg. Hold this position for __________ seconds. Slowly lower your leg to the starting position. Let your leg relax completely after each repetition. Repeat __________ times. Complete this exercise __________ times a day. This information is not intended to replace advice given to you by your health care provider. Make sure you discuss any questions you have with your healthcare provider. Document Revised: 02/06/2018 Document Reviewed: 02/06/2018 Else[employer] Patient Education  2022 [employer] Hand Exercises Hand exercises can be helpful for almost anyone. These exercises can strengthen the hands, improve flexibility and movement, and increase blood flow to the hands. These results can make work and daily tasks easier. Hand exercises can be especially helpful for people who have joint pain from arthritis or have nerve damage from overuse (carpal tunnel syndrome). These exercises can also help people who have injured a hand. Exercises Most of these hand exercises are gentle stretching and motion exercises. It is usually safe to do them often throughout the day. Warming up your hands before exercise may help to reduce stiffness. You can do this with gentle massage orby placing your hands in warm water for 10-15 minutes. It is normal to feel some stretching, pulling, tightness, or mild discomfort as you begin new exercises. This will gradually improve. Stop an exercise right away if you feel sudden, severe pain or your pain gets worse. Ask your healthcare  provider which exercises are best for you. Knuckle bend or "claw" fist Stand or sit with your arm, hand, and all five fingers pointed straight up. Make sure to keep your wrist straight during the exercise. Gently bend your fingers down toward your palm until the tips of your fingers are touching the top of your palm. Keep your big knuckle straight and just bend the small knuckles in your fingers. Hold this position for __________ seconds. Straighten (extend) your fingers back to the starting position. Repeat this exercise 5-10 times with each hand. Full finger fist Stand or sit with your arm, hand, and all five fingers pointed straight up. Make sure to keep your wrist straight during the exercise. Gently bend your fingers into your palm until the tips of your fingers are touching the middle of your palm. Hold this position for __________ seconds.   Extend your fingers back to the starting position, stretching every joint fully. Repeat this exercise 5-10 times with each hand. Straight fist Stand or sit with your arm, hand, and all five fingers pointed straight up. Make sure to keep your wrist straight during the exercise. Gently bend your fingers at the big knuckle, where your fingers meet your hand, and the middle knuckle. Keep the knuckle at the tips of your fingers straight and try to touch the bottom of your palm. Hold this position for __________ seconds. Extend your fingers back to the starting position, stretching every joint fully. Repeat this exercise 5-10 times with each hand. Tabletop Stand or sit with your arm, hand, and all five fingers pointed straight up. Make sure to keep your wrist straight during the exercise. Gently bend your fingers at the big knuckle, where your fingers meet your hand, as far down as you can while keeping the small knuckles in your fingers straight. Think of forming a tabletop with your fingers. Hold this position for __________ seconds. Extend your fingers  back to the starting position, stretching every joint fully. Repeat this exercise 5-10 times with each hand. Finger spread Place your hand flat on a table with your palm facing down. Make sure your wrist stays straight as you do this exercise. Spread your fingers and thumb apart from each other as far as you can until you feel a gentle stretch. Hold this position for __________ seconds. Bring your fingers and thumb tight together again. Hold this position for __________ seconds. Repeat this exercise 5-10 times with each hand. Making circles Stand or sit with your arm, hand, and all five fingers pointed straight up. Make sure to keep your wrist straight during the exercise. Make a circle by touching the tip of your thumb to the tip of your index finger. Hold for __________ seconds. Then open your hand wide. Repeat this motion with your thumb and each finger on your hand. Repeat this exercise 5-10 times with each hand. Thumb motion Sit with your forearm resting on a table and your wrist straight. Your thumb should be facing up toward the ceiling. Keep your fingers relaxed as you move your thumb. Lift your thumb up as high as you can toward the ceiling. Hold for __________ seconds. Bend your thumb across your palm as far as you can, reaching the tip of your thumb for the small finger (pinkie) side of your palm. Hold for __________ seconds. Repeat this exercise 5-10 times with each hand. Grip strengthening  Hold a stress ball or other soft ball in the middle of your hand. Slowly increase the pressure, squeezing the ball as much as you can without causing pain. Think of bringing the tips of your fingers into the middle of your palm. All of your finger joints should bend when doing this exercise. Hold your squeeze for __________ seconds, then relax. Repeat this exercise 5-10 times with each hand. Contact a health care provider if: Your hand pain or discomfort gets much worse when you do an  exercise. Your hand pain or discomfort does not improve within 2 hours after you exercise. If you have any of these problems, stop doing these exercises right away. Do not do them again unless your health care provider says that you can. Get help right away if: You develop sudden, severe hand pain or swelling. If this happens, stop doing these exercises right away. Do not do them again unless your health care provider says that you can. This   information is not intended to replace advice given to you by your health care provider. Make sure you discuss any questions you have with your healthcare provider. Document Revised: 08/09/2018 Document Reviewed: 04/19/2018 Elsevier Patient Education  2022 [employer]  

## 2020-11-10 LAB — CYCLIC CITRUL PEPTIDE ANTIBODY, IGG: Cyclic Citrullin Peptide Ab: <16 UNITS

## 2020-11-10 LAB — URIC ACID: Uric Acid, Serum: 3.9 mg/dL (ref 2.5–7.0)

## 2020-11-10 LAB — SEDIMENTATION RATE: Sed Rate: 9 mm/h (ref 0–30)

## 2020-11-10 LAB — RHEUMATOID FACTOR: Rheumatoid fact SerPl-aCnc: <14 IU/mL (ref <14)

## 2020-11-10 NOTE — Progress Notes (Signed)
Sed rate normal, uric acid normal, rheumatoid factor negative, anti-CCP negative

## 2020-11-22 NOTE — Progress Notes (Deleted)
Office Visit Note  Patient: Michelle Williams             Date of Birth: 1960-07-18           MRN: [id number]             PCP: Rogers Blocker, MD Referring: Rogers Blocker, MD Visit Date: 12/03/2020 Occupation: @GUAROCC @  Subjective:  No chief complaint on file.   History of Present Illness: Michelle Williams is a 60 y.o. female ***   Activities of Daily Living:  Patient reports morning stiffness for *** {minute/hour:19697}.   Patient {ACTIONS;DENIES/REPORTS:21021675::"Denies"} nocturnal pain.  Difficulty dressing/grooming: {ACTIONS;DENIES/REPORTS:21021675::"Denies"} Difficulty climbing stairs: {ACTIONS;DENIES/REPORTS:21021675::"Denies"} Difficulty getting out of chair: {ACTIONS;DENIES/REPORTS:21021675::"Denies"} Difficulty using hands for taps, buttons, cutlery, and/or writing: {ACTIONS;DENIES/REPORTS:21021675::"Denies"}  No Rheumatology ROS completed.   PMFS History:  Patient Active Problem List   Diagnosis Date Noted   Anxiety 05/09/2014   Chronic pain syndrome 05/09/2014   Degenerative disc disease, cervical 05/09/2014    Past Medical History:  Diagnosis Date   Allergy    Anemia    Anxiety    Arthritis    Cervical cancer ([place])    H/O degenerative disc disease    H/O rheumatoid arthritis    Hypertension     Family History  Problem Relation Age of Onset   Hypertension Mother    Stroke Mother    Early death Father    Colon cancer Brother        unsure age when diagnosed    HIV/AIDS Brother    Diabetes Maternal Grandmother    Colon polyps Neg Hx    Esophageal cancer Neg Hx    Rectal cancer Neg Hx    Stomach cancer Neg Hx    Past Surgical History:  Procedure Laterality Date   BREAST EXCISIONAL BIOPSY Right    IR FALLOPIAN TUBE CATHETERIZATION     Social History   Social History Narrative   Not on file    There is no immunization history on file for this patient.   Objective: Vital Signs: There were no vitals taken for this visit.   Physical Exam    Musculoskeletal Exam: ***  CDAI Exam: CDAI Score: -- Patient Global: --; Provider Global: -- Swollen: --; Tender: -- Joint Exam 12/03/2020   No joint exam has been documented for this visit   There is currently no information documented on the homunculus. Go to the Rheumatology activity and complete the homunculus joint exam.  Investigation: No additional findings.  Imaging: XR Hand 2 View Left  Result Date: 11/09/2020 CMC, PIP and DIP narrowing was noted.  No MCP, intercarpal or radiocarpal joint space narrowing was noted.  No erosive changes were noted. Impression: These findings are consistent with osteoarthritis of the hand.  XR Hand 2 View Right  Result Date: 11/09/2020 CMC, PIP and DIP narrowing was noted.  No MCP, intercarpal or radiocarpal joint space narrowing was noted.  No erosive changes were noted. Impression: These findings are consistent with osteoarthritis of the hand.   Recent Labs: Lab Results  Component Value Date   WBC 6.5 11/16/2014   HGB 12.6 11/16/2014   PLT 265 11/16/2014   NA 139 11/16/2014   K 3.5 11/16/2014   CL 108 11/16/2014   CO2 26 11/16/2014   GLUCOSE 93 11/16/2014   BUN 12 11/16/2014   CREATININE 0.72 11/16/2014   BILITOT <0.2 05/21/2018   ALKPHOS 90 05/21/2018   AST 17 05/21/2018   ALT 16 05/21/2018  PROT 6.9 05/21/2018   ALBUMIN 4.2 05/21/2018   CALCIUM 8.7 (L) 11/16/2014   GFRAA >60 11/16/2014   November 09, 2020 ESR 9, uric acid 3.9, RF negative, anti-CCP negative   Speciality Comments: No specialty comments available.  Procedures:  No procedures performed Allergies: Patient has no known allergies.   Assessment / Plan:     Visit Diagnoses: No diagnosis found.  Orders: No orders of the defined types were placed in this encounter.  No orders of the defined types were placed in this encounter.   Face-to-face time spent with patient was *** minutes. Greater than 50% of time was spent in counseling and coordination of  care.  Follow-Up Instructions: No follow-ups on file.   Bo Merino, MD  Note - This record has been created using Editor, commissioning.  Chart creation errors have been sought, but may not always  have been located. Such creation errors do not reflect on  the standard of medical care.

## 2020-12-03 ENCOUNTER — Ambulatory Visit: Payer: Medicare Other | Admitting: Rheumatology

## 2020-12-03 DIAGNOSIS — F419 Anxiety disorder, unspecified: Secondary | ICD-10-CM

## 2020-12-03 DIAGNOSIS — F17200 Nicotine dependence, unspecified, uncomplicated: Secondary | ICD-10-CM

## 2020-12-03 DIAGNOSIS — M19041 Primary osteoarthritis, right hand: Secondary | ICD-10-CM

## 2020-12-03 DIAGNOSIS — Z8709 Personal history of other diseases of the respiratory system: Secondary | ICD-10-CM

## 2020-12-03 DIAGNOSIS — G4709 Other insomnia: Secondary | ICD-10-CM

## 2020-12-03 DIAGNOSIS — G8929 Other chronic pain: Secondary | ICD-10-CM

## 2020-12-03 DIAGNOSIS — M5030 Other cervical disc degeneration, unspecified cervical region: Secondary | ICD-10-CM

## 2020-12-03 DIAGNOSIS — M2550 Pain in unspecified joint: Secondary | ICD-10-CM

## 2020-12-03 DIAGNOSIS — M19071 Primary osteoarthritis, right ankle and foot: Secondary | ICD-10-CM

## 2020-12-03 DIAGNOSIS — I10 Essential (primary) hypertension: Secondary | ICD-10-CM

## 2020-12-03 DIAGNOSIS — G894 Chronic pain syndrome: Secondary | ICD-10-CM

## 2020-12-03 DIAGNOSIS — S46211D Strain of muscle, fascia and tendon of other parts of biceps, right arm, subsequent encounter: Secondary | ICD-10-CM

## 2020-12-03 DIAGNOSIS — M5136 Other intervertebral disc degeneration, lumbar region: Secondary | ICD-10-CM

## 2020-12-29 ENCOUNTER — Ambulatory Visit: Payer: Medicare Other

## 2021-01-05 ENCOUNTER — Other Ambulatory Visit (HOSPITAL_COMMUNITY): Payer: Self-pay | Admitting: Student

## 2021-01-05 DIAGNOSIS — R079 Chest pain, unspecified: Secondary | ICD-10-CM

## 2021-01-11 ENCOUNTER — Other Ambulatory Visit (HOSPITAL_BASED_OUTPATIENT_CLINIC_OR_DEPARTMENT_OTHER): Payer: Self-pay | Admitting: Student

## 2021-01-11 DIAGNOSIS — R079 Chest pain, unspecified: Secondary | ICD-10-CM

## 2021-01-14 ENCOUNTER — Other Ambulatory Visit (HOSPITAL_BASED_OUTPATIENT_CLINIC_OR_DEPARTMENT_OTHER): Payer: Medicare Other

## 2021-01-26 ENCOUNTER — Other Ambulatory Visit (HOSPITAL_BASED_OUTPATIENT_CLINIC_OR_DEPARTMENT_OTHER): Payer: Medicare Other

## 2021-02-05 ENCOUNTER — Ambulatory Visit: Payer: Medicare Other

## 2021-02-10 ENCOUNTER — Other Ambulatory Visit (HOSPITAL_BASED_OUTPATIENT_CLINIC_OR_DEPARTMENT_OTHER): Payer: Medicare Other

## 2021-03-05 ENCOUNTER — Other Ambulatory Visit (HOSPITAL_BASED_OUTPATIENT_CLINIC_OR_DEPARTMENT_OTHER): Payer: Medicare Other

## 2021-04-15 ENCOUNTER — Ambulatory Visit (HOSPITAL_COMMUNITY)
Admission: EM | Admit: 2021-04-15 | Discharge: 2021-04-15 | Disposition: A | Discharge status: Home / Self Care | Payer: Medicare Other | Attending: Family Medicine | Admitting: Family Medicine

## 2021-04-15 ENCOUNTER — Other Ambulatory Visit: Payer: Self-pay

## 2021-04-15 NOTE — ED Notes (Signed)
Pt was called from the waiting room twice and no answer.

## 2021-07-14 ENCOUNTER — Encounter (HOSPITAL_COMMUNITY): Payer: Self-pay | Admitting: *Deleted

## 2021-07-14 ENCOUNTER — Emergency Department (HOSPITAL_COMMUNITY)
Admission: EM | Admit: 2021-07-14 | Discharge: 2021-07-14 | Disposition: A | Discharge status: Home / Self Care | Payer: Medicare Other | Attending: Emergency Medicine | Admitting: Emergency Medicine

## 2021-07-14 ENCOUNTER — Emergency Department (HOSPITAL_COMMUNITY): Payer: Medicare Other

## 2021-07-14 ENCOUNTER — Other Ambulatory Visit: Payer: Self-pay

## 2021-07-14 DIAGNOSIS — S46911A Strain of unspecified muscle, fascia and tendon at shoulder and upper arm level, right arm, initial encounter: Secondary | ICD-10-CM | POA: Diagnosis not present

## 2021-07-14 DIAGNOSIS — S4991XA Unspecified injury of right shoulder and upper arm, initial encounter: Secondary | ICD-10-CM | POA: Diagnosis present

## 2021-07-14 DIAGNOSIS — Y92410 Unspecified street and highway as the place of occurrence of the external cause: Secondary | ICD-10-CM | POA: Diagnosis not present

## 2021-07-14 MED ORDER — OXYCODONE-ACETAMINOPHEN 5-325 MG PO TABS
1.0000 | ORAL_TABLET | Freq: Once | ORAL | Status: AC
  Administered 2021-07-14: 1 via ORAL
  Filled 2021-07-14: qty 1
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
## 2021-07-14 NOTE — Discharge Instructions (Signed)
Recommend bearing weight as tolerated, use sling only as needed for support.  Take Tylenol and anti-inflammatory such as the prescribed naproxen as needed for pain control. ? ?If your symptoms or not improving over the next couple days I advise following up with an orthopedic or sports medicine specialist. ?
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
## 2021-07-14 NOTE — ED Notes (Signed)
All discharge instructions including follow up care reviewed with patient and patient verbalized understanding. Patient stable and ambulatory at time of discharge.  ?
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
## 2021-10-26 ENCOUNTER — Other Ambulatory Visit: Payer: Self-pay | Admitting: Student

## 2021-10-26 DIAGNOSIS — Z1231 Encounter for screening mammogram for malignant neoplasm of breast: Secondary | ICD-10-CM

## 2021-11-01 ENCOUNTER — Ambulatory Visit: Payer: Medicare Other

## 2021-11-05 ENCOUNTER — Ambulatory Visit: Payer: Medicare Other

## 2022-02-19 ENCOUNTER — Emergency Department (HOSPITAL_COMMUNITY)
Admission: EM | Admit: 2022-02-19 | Discharge: 2022-02-19 | Disposition: A | Discharge status: Home / Self Care | Payer: Medicare Other | Attending: Emergency Medicine | Admitting: Emergency Medicine

## 2022-02-19 ENCOUNTER — Encounter (HOSPITAL_COMMUNITY): Payer: Self-pay

## 2022-02-19 ENCOUNTER — Emergency Department (HOSPITAL_COMMUNITY): Payer: Medicare Other

## 2022-02-19 ENCOUNTER — Other Ambulatory Visit: Payer: Self-pay

## 2022-02-19 DIAGNOSIS — R059 Cough, unspecified: Secondary | ICD-10-CM | POA: Diagnosis present

## 2022-02-19 DIAGNOSIS — Z20822 Contact with and (suspected) exposure to covid-19: Secondary | ICD-10-CM | POA: Diagnosis not present

## 2022-02-19 DIAGNOSIS — R0609 Other forms of dyspnea: Secondary | ICD-10-CM | POA: Diagnosis not present

## 2022-02-19 DIAGNOSIS — R9431 Abnormal electrocardiogram [ECG] [EKG]: Secondary | ICD-10-CM | POA: Diagnosis not present

## 2022-02-19 DIAGNOSIS — J321 Chronic frontal sinusitis: Secondary | ICD-10-CM | POA: Diagnosis not present

## 2022-02-19 LAB — RESP PANEL BY RT-PCR (FLU A&B, COVID) ARPGX2
Influenza A by PCR: NEGATIVE
Influenza B by PCR: NEGATIVE
SARS Coronavirus 2 by RT PCR: NEGATIVE

## 2022-02-19 LAB — COMPREHENSIVE METABOLIC PANEL
ALT: 19 U/L (ref 0–44)
AST: 23 U/L (ref 15–41)
Albumin: 3.8 g/dL (ref 3.5–5.0)
Alkaline Phosphatase: 75 U/L (ref 38–126)
Anion gap: 9 (ref 5–15)
BUN: 8 mg/dL (ref 6–20)
CO2: 23 mmol/L (ref 22–32)
Calcium: 9 mg/dL (ref 8.9–10.3)
Chloride: 107 mmol/L (ref 98–111)
Creatinine, Ser: 0.92 mg/dL (ref 0.44–1.00)
GFR, Estimated: >60 mL/min (ref >60)
Glucose, Bld: 138 mg/dL — ABNORMAL HIGH (ref 70–99)
Potassium: 4.1 mmol/L (ref 3.5–5.1)
Sodium: 139 mmol/L (ref 135–145)
Total Bilirubin: 0.3 mg/dL (ref 0.3–1.2)
Total Protein: 7 g/dL (ref 6.5–8.1)

## 2022-02-19 LAB — CBC WITH DIFFERENTIAL/PLATELET
Abs Immature Granulocytes: 0.01 10*3/uL (ref 0.00–0.07)
Basophils Absolute: 0 10*3/uL (ref 0.0–0.1)
Basophils Relative: 0 %
Eosinophils Absolute: 0 10*3/uL (ref 0.0–0.5)
Eosinophils Relative: 0 %
HCT: 40.5 % (ref 36.0–46.0)
Hemoglobin: 13.5 g/dL (ref 12.0–15.0)
Immature Granulocytes: 0 %
Lymphocytes Relative: 22 %
Lymphs Abs: 1.7 10*3/uL (ref 0.7–4.0)
MCH: 31.8 pg (ref 26.0–34.0)
MCHC: 33.3 g/dL (ref 30.0–36.0)
MCV: 95.5 fL (ref 80.0–100.0)
Monocytes Absolute: 0.6 10*3/uL (ref 0.1–1.0)
Monocytes Relative: 8 %
Neutro Abs: 5.3 10*3/uL (ref 1.7–7.7)
Neutrophils Relative %: 70 %
Platelets: 262 10*3/uL (ref 150–400)
RBC: 4.24 MIL/uL (ref 3.87–5.11)
RDW: 13.6 % (ref 11.5–15.5)
WBC: 7.7 10*3/uL (ref 4.0–10.5)
nRBC: 0 % (ref 0.0–0.2)

## 2022-02-19 LAB — TROPONIN I (HIGH SENSITIVITY): Troponin I (High Sensitivity): 5 ng/L (ref <18)

## 2022-02-19 MED ORDER — FLUTICASONE PROPIONATE 50 MCG/ACT NA SUSP: 1.0000 | Freq: Every day | NASAL | 2 refills | Status: AC

## 2022-02-19 MED ORDER — METHYLPREDNISOLONE 4 MG PO TBPK: ORAL_TABLET | ORAL | 0 refills | Status: DC

## 2022-02-19 MED ORDER — BENZONATATE 100 MG PO CAPS: 100.0000 mg | ORAL_CAPSULE | Freq: Three times a day (TID) | ORAL | 0 refills | Status: DC

## 2022-02-19 MED ORDER — IPRATROPIUM-ALBUTEROL 0.5-2.5 (3) MG/3ML IN SOLN
3.0000 mL | Freq: Once | RESPIRATORY_TRACT | Status: AC
  Administered 2022-02-19: 3 mL via RESPIRATORY_TRACT
  Filled 2022-02-19: qty 3

## 2022-02-19 NOTE — ED Triage Notes (Signed)
Patirnt complains of cough, congestion, sob x 10 days. Complains of cp with cough and inspiration. smoker

## 2022-02-19 NOTE — Discharge Instructions (Signed)
Please start taking Flonase for your sinus illness. Additionally, as discussed we have put in a referral for cardiology service because of your abnormal EKG, leg swelling.  Please ensure you drink plenty of water and follow-up with your primary care doctor in 7 to 14 days as well.

## 2022-02-19 NOTE — ED Provider Notes (Signed)
[facility] EMERGENCY DEPARTMENT Provider Note   CSN: [id number] Arrival date & time: 02/19/22  7124     History  No chief complaint on file.   Michelle Williams is a 61 y.o. female.  HPI    61 year old patient comes in with chief complaint of cough, congestion, shortness of breath that has been present now for 2 to 3 months.  Her symptoms have worsened over the last 10 days.  Her cough is producing clear, thick phlegm.  Her nasal drainage is also clear.  Patient has chronic sinusitis and is on Zyrtec, but has not helped her.  She comes into the ER also because of increased weakness and shortness of breath with exertion.  Patient is a smoker.  She has history of asthma, but denies any significant wheezing.  She has used inhaler at home with minimal relief over the last several days.  Pt has no hx of PE, DVT and denies any exogenous hormone (testosterone / estrogen) use, long distance travels or surgery in the past 6 weeks, active cancer, recent immobilization.   Home Medications Prior to Admission medications   Medication Sig Start Date End Date Taking? Authorizing Provider  benzonatate (TESSALON) 100 MG capsule Take 1 capsule (100 mg total) by mouth every 8 (eight) hours. 02/19/22  Yes Ketara Cavness, MD  fluticasone (FLONASE) 50 MCG/ACT nasal spray Place 1 spray into both nostrils daily. 02/19/22  Yes Dorise Gangi, MD  methylPREDNISolone (MEDROL DOSEPAK) 4 MG TBPK tablet Day 1: 8 mg PO before breakfast, 4 mg after lunch and after dinner, and 8 mg at bedtime  Day 2: 4 mg PO before breakfast, after lunch, and after dinner and 8 mg at bedtime  Day 3: 4 mg PO before breakfast, after lunch, after dinner, and at bedtime  Day 4: 4 mg PO before breakfast, after lunch, and at bedtime  Day 5: 4 mg PO before breakfast and at bedtime  Day 6: 4 mg PO before breakfast 02/19/22  Yes Nhi Butrum, MD  cetirizine (ZYRTEC) 10 MG tablet TK 1 T PO  ONCE D 05/08/18    [provider]  cyclobenzaprine (FLEXERIL) 10 MG tablet Take 10 mg by mouth at bedtime as needed. 12/12/16   [provider]  gabapentin (NEURONTIN) 300 MG capsule Take 300 mg by mouth 2 (two) times daily.    [provider]  ibuprofen (ADVIL,MOTRIN) 600 MG tablet Take 1 tablet (600 mg total) by mouth every 6 (six) hours as needed. 07/22/16   Nona Dell, PA-C  methocarbamol (ROBAXIN) 500 MG tablet Take 1 tablet (500 mg total) by mouth 2 (two) times daily. 07/22/16   Nona Dell, PA-C  naproxen (NAPROSYN) 500 MG tablet Take 500 mg by mouth 2 (two) times daily with a meal.    [provider]  triamterene-hydrochlorothiazide (MAXZIDE-25) 37.5-25 MG tablet Take 1 tablet by mouth daily.    [provider]  Vitamin D, Ergocalciferol, (DRISDOL) 50000 units CAPS capsule Take 50,000 Units by mouth every 7 (seven) days.    [provider]      Allergies    Patient has no known allergies.    Review of Systems   Review of Systems  All other systems reviewed and are negative.   Physical Exam Updated Vital Signs BP (!) 178/100   Pulse 85   Temp 100 F (37.8 C) (Oral)   Resp 18   SpO2 96%  Physical Exam Vitals and nursing note reviewed.  Constitutional:  Appearance: She is well-developed.  HENT:     Head: Atraumatic.     Nose: Congestion and rhinorrhea present.  Eyes:     Extraocular Movements: Extraocular movements intact.     Pupils: Pupils are equal, round, and reactive to light.  Cardiovascular:     Rate and Rhythm: Normal rate.  Pulmonary:     Effort: Pulmonary effort is normal.     Breath sounds: No wheezing.  Musculoskeletal:     Cervical back: Normal range of motion and neck supple.     Comments: Trace bilateral lower extremity pitting edema, no focal calf tenderness  Skin:    General: Skin is warm and dry.  Neurological:     Mental Status: She is alert and oriented to person, place, and time.      ED Results / Procedures / Treatments   Labs (all labs ordered are listed, but only abnormal results are displayed) Labs Reviewed  COMPREHENSIVE METABOLIC PANEL - Abnormal; Notable for the following components:      Result Value   Glucose, Bld 138 (*)    All other components within normal limits  RESP PANEL BY RT-PCR (FLU A&B, COVID) ARPGX2  CBC WITH DIFFERENTIAL/PLATELET  TROPONIN I (HIGH SENSITIVITY)  TROPONIN I (HIGH SENSITIVITY)    EKG EKG Interpretation  Date/Time:  Saturday February 19 2022 09:20:47 EDT Ventricular Rate:  90 PR Interval:  146 QRS Duration: 76 QT Interval:  362 QTC Calculation: 442 R Axis:   0 Text Interpretation: Normal sinus rhythm Minimal voltage criteria for LVH, may be normal variant ( R in aVL ) Nonspecific ST and T wave abnormality inferior TWI - new Abnormal ECG When compared with ECG of 13-Mar-2014 11:40, PREVIOUS ECG IS PRESENT Confirmed by Varney Biles [phone]) on 02/19/2022 3:06:26 PM  Radiology DG Chest 2 View  Result Date: 02/19/2022 CLINICAL DATA:  One-month history of cough and shortness of breath EXAM: CHEST - 2 VIEW COMPARISON:  None Available. FINDINGS: Normal lung volumes. No focal consolidations. No pleural effusion or pneumothorax. The heart size and mediastinal contours are within normal limits. The visualized skeletal structures are unremarkable. IMPRESSION: Clear lungs.  Normal heart size. Electronically Signed   By: Darrin Nipper M.D.   On: 02/19/2022 10:21    Procedures Procedures    Medications Ordered in ED Medications  ipratropium-albuterol (DUONEB) 0.5-2.5 (3) MG/3ML nebulizer solution 3 mL (3 mLs Nebulization Given 02/19/22 0936)    ED Course/ Medical Decision Making/ A&P                           Medical Decision Making This patient presents to the ED with chief complaint(s) of nasal congestion, cough, shortness of breath with exertion, weakness and malaise with pertinent past medical history of asthma, chronic  sinusitis.The complaint involves an extensive differential diagnosis and also carries with it a high risk of complications and morbidity.    Patient was given albuterol treatment at arrival.  She no longer has wheezing on my assessment. No focal consolidation appreciated on lung exam.  Patient has trace pitting edema in bilateral lower extremity.  No signs of DVT.  No risk factors for PE/DVT.  The differential diagnosis includes viral URI, sinusitis, superimposed pneumonia, asthma exacerbation, new onset CHF.  Patient has a low-grade fever.  Symptoms are more consistent with infection right now. However, she does have new T wave inversion in the lateral leads compared to 2015 EKG and is having some trace pitting  edema, shortness of breath with exertion.  I think it would be good idea for patient to follow-up with cardiology service as well.  Patient had basic lab work-up, chest x-ray completed at arrival.  Labs are completely reassuring.  Initial high-sensitivity troponin is normal.  I do not think repeat troponin is indicated as she has not had any chest pain and her shortness of breath exertion is rather chronic at this point.  X-ray interpreted independently.  No focal consolidation noted.   Additional history obtained: Records reviewed Primary Care Documents, reviewed patient's medications.  Independent labs interpretation:  The following labs were independently interpreted: CBC, BMP are reassuring, high-sensitivity troponin  Independent visualization and interpretation of imaging: - I independently visualized the following imaging with scope of interpretation limited to determining acute life threatening conditions related to emergency care: X-ray of the chest, which revealed no focal consolidation  Treatment and Reassessment: Patient's BP is high, however she has not taken her morning meds.  She is stable for discharge.  Advised that she takes her BP medications upon reaching  home.  Problems Addressed: Abnormal EKG: undiagnosed new problem with uncertain prognosis Chronic frontal sinusitis: chronic illness or injury with exacerbation, progression, or side effects of treatment Exertional dyspnea: undiagnosed new problem with uncertain prognosis  Risk Prescription drug management.  Final Clinical Impression(s) / ED Diagnoses Final diagnoses:  Abnormal EKG  Exertional dyspnea  Chronic frontal sinusitis    Rx / DC Orders ED Discharge Orders          Ordered    fluticasone (FLONASE) 50 MCG/ACT nasal spray  Daily        02/19/22 1520    Ambulatory referral to Cardiology       Comments: If you have not heard from the Cardiology office within the next 72 hours please call [phone].   02/19/22 1520    benzonatate (TESSALON) 100 MG capsule  Every 8 hours        02/19/22 1521    methylPREDNISolone (MEDROL DOSEPAK) 4 MG TBPK tablet        02/19/22 1522              Varney Biles, MD 02/19/22 1528

## 2022-02-19 NOTE — ED Notes (Signed)
Dc instructions and scripts reviewed with pt no questions or concerns at this time. Will follow up with cardiology.

## 2022-02-19 NOTE — ED Provider Triage Note (Signed)
Emergency Medicine Provider Triage Evaluation Note  Michelle Williams , a 61 y.o. female  was evaluated in triage.  Pt complains of shortness of breath. States she was diagnosed with acute bronchitis. States she feels short of breath and having cough. She states she had low grade fever here. Has had symptoms for 2-3 days. Has central chest pain that is non radiating. Also endorsing headache. Had nausea and vomiting x2 days. No sick contacts.  Review of Systems  Positive: See above Negative:   Physical Exam  BP (!) 177/115 (BP Location: Right Arm)   Pulse 92   Temp 100.3 F (37.9 C) (Oral)   Resp 20   SpO2 95%  Gen:   Awake, crying on exam, anxious Resp:  Tachypnea, upper lobe wheezing  MSK:   Moves extremities without difficulty  Other:  S1/s2 without murmur  Medical Decision Making  Medically screening exam initiated at 9:27 AM.  Appropriate orders placed.  Michelle Williams was informed that the remainder of the evaluation will be completed by another provider, this initial triage assessment does not replace that evaluation, and the importance of remaining in the ED until their evaluation is complete.  Will give albuterol tx in triage and can place in waiting room after that    Michelle Hillier, PA-C 02/19/22 2202

## 2022-02-22 ENCOUNTER — Encounter: Payer: Self-pay | Admitting: Gastroenterology

## 2022-02-28 ENCOUNTER — Encounter: Payer: Self-pay | Admitting: Gastroenterology

## 2022-03-07 ENCOUNTER — Ambulatory Visit (AMBULATORY_SURGERY_CENTER): Payer: Self-pay | Admitting: *Deleted

## 2022-03-07 VITALS — Ht 64.0 in | Wt 170.0 lb

## 2022-03-07 DIAGNOSIS — Z86010 Personal history of colonic polyps: Secondary | ICD-10-CM

## 2022-03-07 MED ORDER — NA SULFATE-K SULFATE-MG SULF 17.5-3.13-1.6 GM/177ML PO SOLN: 1.0000 | Freq: Once | ORAL | 0 refills | Status: AC

## 2022-03-07 NOTE — Progress Notes (Signed)
No egg or soy allergy known to patient  No issues known to pt with past sedation with any surgeries or procedures Patient denies ever being told they had issues or difficulty with intubation  No FH of Malignant Hyperthermia Pt is not on diet pills Pt is not on home 02  Pt is not on blood thinners  Pt denies issues with constipation  No A fib or A flutter Have any cardiac testing pending--NO Pt instructed to use [email or web address] or GoodRx for a price reduction on prep   

## 2022-03-14 NOTE — Progress Notes (Incomplete)
CARDIOLOGY CONSULT NOTE       Patient ID: Michelle Williams MRN: [id number] DOB/AGE: [birth year] 61 y.o.  Admit date: (Not on file) Referring Physician: Sallee Williams [facility] Primary Physician: Michelle Blocker, MD Primary Cardiologist: Michelle Williams: Abnormal ECG and Dyspnea  Active Problems:   * No active hospital problems. *   HPI:  61 y.o. referred by Dr Michelle Williams for dyspnea and abnormal ECG Michelle Williams in [facility] ER 02/19/22 Complained of cough and dyspnea for 3 months Has chronic sinusitis not helped by Zyrtec She smokes and has a history of asthma She had low grade fever CXR NAD Rx with Tessalon, Meddrol dose pack and Flonase ECG SR with inferior lateral ST changes Michelle from 2015 but no recent ECGls Negative respiratory panel Troponin negative  ***  ROS All other systems reviewed and negative except as noted above  Past Medical History:  Diagnosis Date  . Acute bronchitis   . Allergy   . Anemia   . Anxiety   . Arthritis   . Cataract    BEGINIING  . Cervical cancer ([place])   . Degenerative joint disease    UPPER,LOWER BACK  . H/O degenerative disc disease   . H/O rheumatoid arthritis   . Hypertension   . Neuropathy    BILATERAL    Family History  Problem Relation Age of Onset  . Hypertension Mother   . Stroke Mother   . Early death Father   . Colon cancer Brother        unsure age when diagnosed   . HIV/AIDS Brother   . Diabetes Maternal Grandmother   . Colon polyps Neg Hx   . Esophageal cancer Neg Hx   . Rectal cancer Neg Hx   . Stomach cancer Neg Hx   . Crohn's disease Neg Hx   . Ulcerative colitis Neg Hx     Social History   Socioeconomic History  . Marital status: Single    Spouse name: Not on file  . Number of children: Not on file  . Years of education: Not on file  . Highest education level: Not on file  Occupational History  . Not on file  Tobacco Use  . Smoking status: Every Day    Types: Cigarettes    Passive exposure: Never  . Smokeless  tobacco: Never  Vaping Use  . Vaping Use: Never used  Substance and Sexual Activity  . Alcohol use: Not Currently    Comment: wine occ  . Drug use: No  . Sexual activity: Yes    Birth control/protection: None  Other Topics Concern  . Not on file  Social History Narrative  . Not on file   Social Determinants of Health   Financial Resource Strain: Not on file  Food Insecurity: Not on file  Transportation Needs: Not on file  Physical Activity: Not on file  Stress: Not on file  Social Connections: Not on file  Intimate Partner Violence: Not on file    Past Surgical History:  Procedure Laterality Date  . BREAST EXCISIONAL BIOPSY Right   . COLONOSCOPY     2018  . IR FALLOPIAN TUBE CATHETERIZATION    . POLYPECTOMY        Current Outpatient Medications:  .  albuterol (VENTOLIN HFA) 108 (90 Base) MCG/ACT inhaler, Inhale into the lungs., Disp: , Rfl:  .  cetirizine (ZYRTEC) 10 MG tablet, TK 1 T PO  ONCE D (Patient not taking: Reported on 03/07/2022), Disp: , Rfl:  .  cyclobenzaprine (FLEXERIL) 10 MG tablet, Take 10 mg by mouth at bedtime as needed. (Patient not taking: Reported on 03/07/2022), Disp: , Rfl: 0 .  diltiazem (TIAZAC) 120 MG 24 hr capsule, Take 120 mg by mouth daily., Disp: , Rfl:  .  fluticasone (FLONASE) 50 MCG/ACT nasal spray, Place 1 spray into both nostrils daily., Disp: 9.9 mL, Rfl: 2 .  gabapentin (NEURONTIN) 300 MG capsule, Take 300 mg by mouth 2 (two) times daily., Disp: , Rfl:  .  naproxen (NAPROSYN) 500 MG tablet, Take 500 mg by mouth 2 (two) times daily with a meal., Disp: , Rfl:  .  triamterene-hydrochlorothiazide (MAXZIDE-25) 37.5-25 MG tablet, Take 1 tablet by mouth daily., Disp: , Rfl:  .  Vitamin D, Ergocalciferol, (DRISDOL) 50000 units CAPS capsule, Take 50,000 Units by mouth every 7 (seven) days. (Patient not taking: Reported on 03/07/2022), Disp: , Rfl:     Physical Exam: There were no vitals taken for this visit.    Affect appropriate Healthy:   appears stated age 61: normal Neck supple with no adenopathy JVP normal no bruits no thyromegaly Lungs clear with no wheezing and good diaphragmatic motion Heart:  S1/S2 no murmur, no rub, gallop or click PMI normal Abdomen: benighn, BS positve, no tenderness, no AAA no bruit.  No HSM or HJR Distal pulses intact with no bruits No edema Neuro non-focal Skin warm and dry No muscular weakness   Labs:   Lab Results  Component Value Date   WBC 7.7 02/19/2022   HGB 13.5 02/19/2022   HCT 40.5 02/19/2022   MCV 95.5 02/19/2022   PLT 262 02/19/2022   No results for input(s): "NA", "K", "CL", "CO2", "BUN", "CREATININE", "CALCIUM", "PROT", "BILITOT", "ALKPHOS", "ALT", "AST", "GLUCOSE" in the last 168 hours.  Invalid input(s): "LABALBU" Lab Results  Component Value Date   TROPONINI < 0.02 03/13/2014   No results found for: "CHOL" No results found for: "HDL" No results found for: "[place]" No results found for: "TRIG" No results found for: "CHOLHDL" No results found for: "LDLDIRECT"    Radiology: DG Chest 2 View  Result Date: 02/19/2022 CLINICAL DATA:  One-month history of cough and shortness of breath EXAM: CHEST - 2 VIEW COMPARISON:  None Available. FINDINGS: Normal lung volumes. No focal consolidations. No pleural effusion or pneumothorax. The heart size and mediastinal contours are within normal limits. The visualized skeletal structures are unremarkable. IMPRESSION: Clear lungs.  Normal heart size. Electronically Signed   By: Darrin Nipper M.D.   On: 02/19/2022 10:21    EKG: See HPI  ***   ASSESSMENT AND PLAN:   Abnormal ECG:  doubt ischemic changes check TTE to r/o myocardial Dx such as HOCM/DCM  Dyspnea ? Related to URI Prolonged Assess RV/LV EF as above With abnormal ECG need to r/o CAD Shared decision making *** Smoking Counseled on cessation < 10 minutes see above will assess lung fields at time of cardiac CTA  HTN:  continue diuretic and cardizem   Cardiac  CTA Lopressor 100 mg night before and morning of BMET/BNP TTE   F/U PRN pending study results   Signed: Jenkins Williams 03/14/2022, 12:12 PM

## 2022-03-21 ENCOUNTER — Other Ambulatory Visit: Payer: Self-pay | Admitting: Student

## 2022-03-21 DIAGNOSIS — F17210 Nicotine dependence, cigarettes, uncomplicated: Secondary | ICD-10-CM

## 2022-03-22 ENCOUNTER — Ambulatory Visit: Payer: Medicare Other | Admitting: Cardiovascular Disease

## 2022-03-27 ENCOUNTER — Encounter: Payer: Self-pay | Admitting: Certified Registered Nurse Anesthetist

## 2022-03-31 ENCOUNTER — Encounter: Payer: Medicare Other | Admitting: Gastroenterology

## 2022-04-03 ENCOUNTER — Encounter: Payer: Self-pay | Admitting: Certified Registered Nurse Anesthetist

## 2022-04-04 ENCOUNTER — Telehealth: Payer: Self-pay

## 2022-04-04 NOTE — Telephone Encounter (Signed)
-----   Message from Josue Hector, MD sent at 04/04/2022  2:48 PM EST ----- New patient 10/14 need echo for abnormal ECG and dyspnea prior to visit

## 2022-04-04 NOTE — Progress Notes (Incomplete)
CARDIOLOGY CONSULT NOTE       Patient ID: Michelle Williams MRN: [id number] DOB/AGE: May 13, 1960 61 y.o.  Admit date: (Not on file) Referring Physician: Kathrynn Humble  Primary Physician: Rogers Blocker, MD Primary Cardiologist: New Reason for Consultation: Exertional Dyspnea   Active Problems:   * No active hospital problems. *   HPI:  61 y.o. referred by [facility] ER Dr Kathrynn Humble for exertional dyspnea Seen in ED 02/19/22 Cough congestion and dyspnea for 3 months Chronic sinusitis Rx with Zyrtec She smokes, has asthma Inhaler did not provide any relief She had low grade fever 100 F She was Rx with albuterol, flonase Tessalon and medrol dose pack and d/c CXR NAD Respiratory panel negative Troponin negative WBC not elevated  Concern for ECG changes from 2015 Reviewed 02/21/22 NSR Inferior lateral T wave changes with LVH  ***  ROS All other systems reviewed and negative except as noted above  Past Medical History:  Diagnosis Date  . Acute bronchitis   . Allergy   . Anemia   . Anxiety   . Arthritis   . Cataract    BEGINIING  . Cervical cancer ([place])   . Degenerative joint disease    UPPER,LOWER BACK  . H/O degenerative disc disease   . H/O rheumatoid arthritis   . Hypertension   . Neuropathy    BILATERAL    Family History  Problem Relation Age of Onset  . Hypertension Mother   . Stroke Mother   . Early death Father   . Colon cancer Brother        unsure age when diagnosed   . HIV/AIDS Brother   . Diabetes Maternal Grandmother   . Colon polyps Neg Hx   . Esophageal cancer Neg Hx   . Rectal cancer Neg Hx   . Stomach cancer Neg Hx   . Crohn's disease Neg Hx   . Ulcerative colitis Neg Hx     Social History   Socioeconomic History  . Marital status: Single    Spouse name: Not on file  . Number of children: Not on file  . Years of education: Not on file  . Highest education level: Not on file  Occupational History  . Not on file  Tobacco Use  . Smoking status: Every  Day    Types: Cigarettes    Passive exposure: Never  . Smokeless tobacco: Never  Vaping Use  . Vaping Use: Never used  Substance and Sexual Activity  . Alcohol use: Not Currently    Comment: wine occ  . Drug use: No  . Sexual activity: Yes    Birth control/protection: None  Other Topics Concern  . Not on file  Social History Narrative  . Not on file   Social Determinants of Health   Financial Resource Strain: Not on file  Food Insecurity: Not on file  Transportation Needs: Not on file  Physical Activity: Not on file  Stress: Not on file  Social Connections: Not on file  Intimate Partner Violence: Not on file    Past Surgical History:  Procedure Laterality Date  . BREAST EXCISIONAL BIOPSY Right   . COLONOSCOPY     2018  . IR FALLOPIAN TUBE CATHETERIZATION    . POLYPECTOMY        Current Outpatient Medications:  .  albuterol (VENTOLIN HFA) 108 (90 Base) MCG/ACT inhaler, Inhale into the lungs., Disp: , Rfl:  .  cetirizine (ZYRTEC) 10 MG tablet, TK 1 T PO  ONCE D (Patient  not taking: Reported on 03/07/2022), Disp: , Rfl:  .  cyclobenzaprine (FLEXERIL) 10 MG tablet, Take 10 mg by mouth at bedtime as needed. (Patient not taking: Reported on 03/07/2022), Disp: , Rfl: 0 .  diltiazem (TIAZAC) 120 MG 24 hr capsule, Take 120 mg by mouth daily., Disp: , Rfl:  .  fluticasone (FLONASE) 50 MCG/ACT nasal spray, Place 1 spray into both nostrils daily., Disp: 9.9 mL, Rfl: 2 .  gabapentin (NEURONTIN) 300 MG capsule, Take 300 mg by mouth 2 (two) times daily., Disp: , Rfl:  .  naproxen (NAPROSYN) 500 MG tablet, Take 500 mg by mouth 2 (two) times daily with a meal., Disp: , Rfl:  .  triamterene-hydrochlorothiazide (MAXZIDE-25) 37.5-25 MG tablet, Take 1 tablet by mouth daily., Disp: , Rfl:  .  Vitamin D, Ergocalciferol, (DRISDOL) 50000 units CAPS capsule, Take 50,000 Units by mouth every 7 (seven) days. (Patient not taking: Reported on 03/07/2022), Disp: , Rfl:     Physical Exam: There were  no vitals taken for this visit.    Affect appropriate Healthy:  appears stated age 56: normal Neck supple with no adenopathy JVP normal no bruits no thyromegaly Lungs clear with no wheezing and good diaphragmatic motion Heart:  S1/S2 no murmur, no rub, gallop or click PMI normal Abdomen: benighn, BS positve, no tenderness, no AAA no bruit.  No HSM or HJR Distal pulses intact with no bruits No edema Neuro non-focal Skin warm and dry No muscular weakness   Labs:   Lab Results  Component Value Date   WBC 7.7 02/19/2022   HGB 13.5 02/19/2022   HCT 40.5 02/19/2022   MCV 95.5 02/19/2022   PLT 262 02/19/2022   No results for input(s): "NA", "K", "CL", "CO2", "BUN", "CREATININE", "CALCIUM", "PROT", "BILITOT", "ALKPHOS", "ALT", "AST", "GLUCOSE" in the last 168 hours.  Invalid input(s): "LABALBU" Lab Results  Component Value Date   TROPONINI < 0.02 03/13/2014   No results found for: "CHOL" No results found for: "HDL" No results found for: "[place]" No results found for: "TRIG" No results found for: "CHOLHDL" No results found for: "LDLDIRECT"    Radiology: No results found.  EKG: See HPI   ASSESSMENT AND PLAN:   Dyspnea: more chronic ER visit 02/19/22 remarkable for normal WBC, negative respiratory panel, no BNP done TTE *** She is a smoker and likely had URI CXR NAD Needs lung cancer screening CT  Abnormal ECG:  likely from LVH no chest pain Shared decision making favor *** HTN:  Chronic Rx with cardizem and maxide *** Sinusitis:  f/u primary consider xray on Zyrtec    TTE Lung cancer CT BNP/BMET ***  F/U pending test results   Signed: Jenkins Rouge 04/04/2022, 2:41 PM

## 2022-04-04 NOTE — Telephone Encounter (Signed)
Left message for patient to call back  

## 2022-04-08 ENCOUNTER — Ambulatory Visit: Payer: Medicare Other

## 2022-04-08 ENCOUNTER — Encounter: Payer: Self-pay | Admitting: Gastroenterology

## 2022-04-08 ENCOUNTER — Ambulatory Visit (AMBULATORY_SURGERY_CENTER): Payer: Medicare Other | Admitting: Gastroenterology

## 2022-04-08 VITALS — BP 110/69 | HR 84 | Temp 97.3°F | Resp 19 | Ht 64.0 in | Wt 170.0 lb

## 2022-04-08 DIAGNOSIS — Z09 Encounter for follow-up examination after completed treatment for conditions other than malignant neoplasm: Secondary | ICD-10-CM

## 2022-04-08 DIAGNOSIS — Z800 Family history of malignant neoplasm of digestive organs: Secondary | ICD-10-CM | POA: Diagnosis not present

## 2022-04-08 DIAGNOSIS — Z86010 Personal history of colonic polyps: Secondary | ICD-10-CM

## 2022-04-08 MED ORDER — SODIUM CHLORIDE 0.9 % IV SOLN: 500.0000 mL | INTRAVENOUS | Status: DC

## 2022-04-08 NOTE — Progress Notes (Signed)
1523 Pt awake and following commands, vss.  Pt taken to PACU. Pt requesting her own albuterol MDI with MD updated, told not to use due meds given during procedure.

## 2022-04-08 NOTE — Progress Notes (Signed)
1520 Pt experiencing coughing and c/o SOB with albuterol neb given. vss

## 2022-04-08 NOTE — Progress Notes (Signed)
Vital signs checked by:DT  The patient states no changes in medical or surgical history since pre-visit screening on 03/07/2022

## 2022-04-08 NOTE — Op Note (Signed)
[place] Patient Name: Michelle Williams Procedure Date: 04/08/2022 2:38 PM MRN: [id number] Endoscopist: Mallie Mussel L. Loletha Carrow , MD, [id number] Age: 61 Referring MD:  Date of Birth: 1960-09-14 Gender: Female Account #: [id number] Procedure:                Colonoscopy Indications:              Colon cancer screening in patient at increased                            risk: Colorectal cancer in brother, Surveillance:                            Personal history of adenomatous polyps on last                            colonoscopy 5 years ago                           Diminutive TA x 2 - Oct 2018 Medicines:                Monitored Anesthesia Care Procedure:                Pre-Anesthesia Assessment:                           - Prior to the procedure, a History and Physical                            was performed, and patient medications and                            allergies were reviewed. The patient's tolerance of                            previous anesthesia was also reviewed. The risks                            and benefits of the procedure and the sedation                            options and risks were discussed with the patient.                            All questions were answered, and informed consent                            was obtained. Prior Anticoagulants: The patient has                            taken no anticoagulant or antiplatelet agents. ASA                            Grade Assessment: II - A patient with mild systemic  disease. After reviewing the risks and benefits,                            the patient was deemed in satisfactory condition to                            undergo the procedure.                           After obtaining informed consent, the colonoscope                            was passed under direct vision. Throughout the                            procedure, the patient's blood pressure, pulse, and                             oxygen saturations were monitored continuously. The                            PCF-HQ190L Colonoscope was introduced through the                            anus and advanced to the the cecum, identified by                            appendiceal orifice and ileocecal valve. The                            colonoscopy was performed with difficulty due to a                            redundant colon and significant looping as well as                            coughing. Successful completion of the procedure                            was aided by using manual pressure and                            straightening and shortening the scope to obtain                            bowel loop reduction. The patient tolerated the                            procedure. The quality of the bowel preparation was                            good. The ileocecal valve, appendiceal orifice, and  rectum were photographed. The bowel preparation                            used was SUPREP via split dose instruction. Scope In: 2:50:44 PM Scope Out: 3:07:25 PM Scope Withdrawal Time: 0 hours 9 minutes 4 seconds  Total Procedure Duration: 0 hours 16 minutes 41 seconds  Findings:                 The perianal and digital rectal examinations were                            normal.                           Repeat examination of right colon under NBI                            performed.                           There is no endoscopic evidence of polyps in the                            entire colon.                           Retroflexion in the rectum was not performed due to                            anatomy.                           The exam was otherwise without abnormality. Complications:            No immediate complications. Estimated Blood Loss:     Estimated blood loss: none. Impression:               - The examination was otherwise normal.                           -  No specimens collected. Recommendation:           - Patient has a contact number available for                            emergencies. The signs and symptoms of potential                            delayed complications were discussed with the                            patient. Return to normal activities tomorrow.                            Written discharge instructions were provided to the                            patient.                           -  Resume previous diet.                           - Continue present medications.                           - Repeat colonoscopy in 5 years for screening                            purposes due to family history. [name] L. Loletha Carrow, MD 04/08/2022 3:13:40 PM This report has been signed electronically.

## 2022-04-08 NOTE — Progress Notes (Signed)
1452  Pt experienced laryngeal spasm with jaw thrust performed. Robinul 0.2 mg IV given due large amount of secretions upon assessment.  MD made aware.

## 2022-04-08 NOTE — Patient Instructions (Signed)
Resume previous diet and continue present medications. Repeat colonoscopy in 5 years for surveillance due to family history.    YOU HAD AN ENDOSCOPIC PROCEDURE TODAY AT [facility]:   Refer to the procedure report that was given to you for any specific questions about what was found during the examination.  If the procedure report does not answer your questions, please call your gastroenterologist to clarify.  If you requested that your care partner not be given the details of your procedure findings, then the procedure report has been included in a sealed envelope for you to review at your convenience later.  YOU SHOULD EXPECT: Some feelings of bloating in the abdomen. Passage of more gas than usual.  Walking can help get rid of the air that was put into your GI tract during the procedure and reduce the bloating. If you had a lower endoscopy (such as a colonoscopy or flexible sigmoidoscopy) you may notice spotting of blood in your stool or on the toilet paper. If you underwent a bowel prep for your procedure, you may not have a normal bowel movement for a few days.  Please Note:  You might notice some irritation and congestion in your nose or some drainage.  This is from the oxygen used during your procedure.  There is no need for concern and it should clear up in a day or so.  SYMPTOMS TO REPORT IMMEDIATELY:  Following lower endoscopy (colonoscopy or flexible sigmoidoscopy):  Excessive amounts of blood in the stool  Significant tenderness or worsening of abdominal pains  Swelling of the abdomen that is new, acute  Fever of 100F or higher  For urgent or emergent issues, a gastroenterologist can be reached at any hour by calling [phone]. Do not use MyChart messaging for urgent concerns.    DIET:  We do recommend a small meal at first, but then you may proceed to your regular diet.  Drink plenty of fluids but you should avoid alcoholic beverages for 24  hours.  ACTIVITY:  You should plan to take it easy for the rest of today and you should NOT DRIVE or use heavy machinery until tomorrow (because of the sedation medicines used during the test).    FOLLOW UP: Our staff will call the number listed on your records the next business day following your procedure.  We will call around 7:15- 8:00 am to check on you and address any questions or concerns that you may have regarding the information given to you following your procedure. If we do not reach you, we will leave a message.     If any biopsies were taken you will be contacted by phone or by letter within the next 1-3 weeks.  Please call us at [phone] if you have not heard about the biopsies in 3 weeks.    SIGNATURES/CONFIDENTIALITY: You and/or your care partner have signed paperwork which will be entered into your electronic medical record.  These signatures attest to the fact that that the information above on your After Visit Summary has been reviewed and is understood.  Full responsibility of the confidentiality of this discharge information lies with you and/or your care-partner.

## 2022-04-08 NOTE — Progress Notes (Signed)
1515 Pt experiencing coughing and SOB with albuterol neb given. vss

## 2022-04-08 NOTE — Progress Notes (Signed)
History and Physical:  This patient presents for endoscopic testing for: Encounter Diagnosis  Name Primary?   Personal history of colonic polyps Yes    Diminutive TA polyps x 2 last colonoscopy Oct 2018.  Brother had CRC.  Patient denies chronic abdominal pain, rectal bleeding, constipation or diarrhea.   Patient is otherwise without complaints or active issues today.   Past Medical History: Past Medical History:  Diagnosis Date   Acute bronchitis    Allergy    Anemia    Anxiety    Arthritis    Cataract    BEGINIING   Cervical cancer ([place])    Degenerative joint disease    UPPER,LOWER BACK   H/O degenerative disc disease    H/O rheumatoid arthritis    Hypertension    Neuropathy    BILATERAL     Past Surgical History: Past Surgical History:  Procedure Laterality Date   BREAST EXCISIONAL BIOPSY Right    COLONOSCOPY     2018   IR FALLOPIAN TUBE CATHETERIZATION     POLYPECTOMY      Allergies: No Known Allergies  Outpatient Meds: Current Outpatient Medications  Medication Sig Dispense Refill   albuterol (VENTOLIN HFA) 108 (90 Base) MCG/ACT inhaler Inhale into the lungs.     cyclobenzaprine (FLEXERIL) 10 MG tablet Take 10 mg by mouth at bedtime as needed.  0   diltiazem (TIAZAC) 120 MG 24 hr capsule Take 120 mg by mouth daily.     fluticasone (FLONASE) 50 MCG/ACT nasal spray Place 1 spray into both nostrils daily. 9.9 mL 2   gabapentin (NEURONTIN) 300 MG capsule Take 300 mg by mouth 2 (two) times daily.     naproxen (NAPROSYN) 500 MG tablet Take 500 mg by mouth 2 (two) times daily with a meal.     triamterene-hydrochlorothiazide (MAXZIDE-25) 37.5-25 MG tablet Take 1 tablet by mouth daily.     Current Facility-Administered Medications  Medication Dose Route Frequency Provider Last Rate Last Admin   0.9 %  sodium chloride infusion  500 mL Intravenous Continuous Danis, Estill Cotta III, MD           ___________________________________________________________________ Objective   Exam:  BP 129/86   Pulse 75   Temp (!) 97.3 F (36.3 C)   Ht '5\' 4"'$  (1.626 m)   Wt 170 lb (77.1 kg)   SpO2 96%   BMI 29.18 kg/m   CV: regular , S1/S2 Resp: clear to auscultation bilaterally, normal RR and effort noted GI: soft, no tenderness, with active bowel sounds.   Assessment: Encounter Diagnosis  Name Primary?   Personal history of colonic polyps Yes     Plan: Colonoscopy  The benefits and risks of the planned procedure were described in detail with the patient or (when appropriate) their health care proxy.  Risks were outlined as including, but not limited to, bleeding, infection, perforation, adverse medication reaction leading to cardiac or pulmonary decompensation, pancreatitis (if ERCP).  The limitation of incomplete mucosal visualization was also discussed.  No guarantees or warranties were given.    The patient is appropriate for an endoscopic procedure in the ambulatory setting.   - Wilfrid Lund, MD

## 2022-04-11 ENCOUNTER — Telehealth: Payer: Self-pay | Admitting: *Deleted

## 2022-04-11 NOTE — Telephone Encounter (Signed)
  Follow up Call-     04/08/2022    2:01 PM  Call back number  Post procedure Call Back phone  # [phone]  Permission to leave phone message Yes     Patient questions:  Do you have a fever, pain , or abdominal swelling? No. Pain Score  0 *  Have you tolerated food without any problems? Yes.    Have you been able to return to your normal activities? Yes.    Do you have any questions about your discharge instructions: Diet   No. Medications  No. Follow up visit  No.  Do you have questions or concerns about your Care? No.  Actions: * If pain score is 4 or above: No action needed, pain <4.

## 2022-04-14 ENCOUNTER — Ambulatory Visit: Payer: Medicare Other | Admitting: Cardiovascular Disease

## 2022-04-15 ENCOUNTER — Ambulatory Visit
Admission: RE | Admit: 2022-04-15 | Discharge: 2022-04-15 | Disposition: A | Discharge status: Home / Self Care | Payer: Medicare Other | Source: Ambulatory Visit | Attending: Student | Admitting: Student

## 2022-04-15 DIAGNOSIS — F17210 Nicotine dependence, cigarettes, uncomplicated: Secondary | ICD-10-CM

## 2022-05-06 ENCOUNTER — Encounter: Payer: Self-pay | Admitting: Interventional Cardiology

## 2022-05-06 ENCOUNTER — Ambulatory Visit: Payer: Medicare Other | Attending: Cardiovascular Disease | Admitting: Interventional Cardiology

## 2022-05-06 VITALS — BP 142/86 | HR 58 | Ht 64.0 in | Wt 162.4 lb

## 2022-05-06 DIAGNOSIS — Z720 Tobacco use: Secondary | ICD-10-CM

## 2022-05-06 DIAGNOSIS — K30 Functional dyspepsia: Secondary | ICD-10-CM

## 2022-05-06 DIAGNOSIS — R9431 Abnormal electrocardiogram [ECG] [EKG]: Secondary | ICD-10-CM

## 2022-05-06 DIAGNOSIS — I10 Essential (primary) hypertension: Secondary | ICD-10-CM

## 2022-05-06 MED ORDER — VARENICLINE TARTRATE (STARTER) 0.5 MG X 11 & 1 MG X 42 PO TBPK: ORAL_TABLET | ORAL | 0 refills | Status: AC

## 2022-05-06 MED ORDER — LISINOPRIL 10 MG PO TABS: 10.0000 mg | ORAL_TABLET | Freq: Every day | ORAL | 6 refills | Status: DC

## 2022-05-06 NOTE — Patient Instructions (Signed)
Medication Instructions:  Your physician has recommended you make the following change in your medication:  Start Lisinopril 10 mg by mouth daily. Start Chantix starter pack.  Follow label instructions  *If you need a refill on your cardiac medications before your next appointment, please call your pharmacy*   Lab Work: Your physician recommends that you return for lab work in: 1-2 weeks.  BMP and Lipid profile.  This will be fasting  If you have labs (blood work) drawn today and your tests are completely normal, you will receive your results only by: [place] (if you have MyChart) OR A paper copy in the mail If you have any lab test that is abnormal or we need to change your treatment, we will call you to review the results.   Testing/Procedures: Your physician has requested that you have an echocardiogram. Echocardiography is a painless test that uses sound waves to create images of your heart. It provides your doctor with information about the size and shape of your heart and how well your heart's chambers and valves are working. This procedure takes approximately one hour. There are no restrictions for this procedure. Please do NOT wear cologne, perfume, aftershave, or lotions (deodorant is allowed). Please arrive 15 minutes prior to your appointment time.    Follow-Up: At [facility], you and your health needs are our priority.  As part of our continuing mission to provide you with exceptional heart care, we have created designated Provider Care Teams.  These Care Teams include your primary Cardiologist (physician) and Advanced Practice Providers (APPs -  Physician Assistants and Nurse Practitioners) who all work together to provide you with the care you need, when you need it.  We recommend signing up for the patient portal called "MyChart".  Sign up information is provided on this After Visit Summary.  MyChart is used to connect with patients for Virtual Visits  (Telemedicine).  Patients are able to view lab/test results, encounter notes, upcoming appointments, etc.  Non-urgent messages can be sent to your provider as well.   To learn more about what you can do with MyChart, go to [email or web address].    Your next appointment:   As needed   The format for your next appointment:   In Person  Provider:   Larae Grooms, MD     Other Instructions You have been referred to see the pharmacist in our hypertension clinic.  Please schedule appointment for 4-6 weeks from now. Check blood pressure at home and bring readings to appointment.  Also bring BP cuff to this appointment.    Important Information About Sugar

## 2022-05-06 NOTE — Progress Notes (Signed)
Cardiology Office Note   Date:  05/06/2022   ID:  Michelle Williams, Michelle Williams [birth year], MRN [id number]  PCP:  Rogers Blocker, MD    No chief complaint on file.  DOE  [facility] Readings from Last 3 Encounters:  05/06/22 162 lb 6.4 oz (73.7 kg)  04/08/22 170 lb (77.1 kg)  03/07/22 170 lb (77.1 kg)       History of Present Illness: Michelle Williams is a 62 y.o. female who is being seen today for the evaluation of shortness of breath at the request of Varney Biles, MD.  ER records from October 2023 showed: "Chief complaint of cough, congestion, shortness of breath that has been present now for 2 to 3 months.  Her symptoms have worsened over the last 10 days.  Her cough is producing clear, thick phlegm.  Her nasal drainage is also clear.  Patient has chronic sinusitis and is on Zyrtec, but has not helped her.  She comes into the ER also because of increased weakness and shortness of breath with exertion.  Patient is a smoker.  She has history of asthma, but denies any significant wheezing.  She has used inhaler at home with minimal relief over the last several days.   Pt has no hx of PE, DVT and denies any exogenous hormone (testosterone / estrogen) use, long distance travels or surgery in the past 6 weeks, active cancer, recent immobilization."  "she does have new T wave inversion in the lateral leads compared to 2015 EKG and is having some trace pitting edema, shortness of breath with exertion. I think it would be good idea for patient to follow-up with cardiology service as well. "  Troponin was negative.  No significant abnormality on chest x-ray noted: "Clear lungs. Normal heart size. "  She has been on BP meds for many years.   Denies : Chest pain. Dizziness. Leg edema. Nitroglycerin use. Orthopnea. Palpitations. Paroxysmal nocturnal dyspnea. Shortness of breath. Syncope.    Past Medical History:  Diagnosis Date   Acute bronchitis    Allergy    Anemia    Anxiety    Arthritis     Cataract    BEGINIING   Cervical cancer ([place])    Degenerative joint disease    UPPER,LOWER BACK   H/O degenerative disc disease    H/O rheumatoid arthritis    Hypertension    Neuropathy    BILATERAL    Past Surgical History:  Procedure Laterality Date   BREAST EXCISIONAL BIOPSY Right    COLONOSCOPY     2018   IR FALLOPIAN TUBE CATHETERIZATION     POLYPECTOMY       Current Outpatient Medications  Medication Sig Dispense Refill   albuterol (VENTOLIN HFA) 108 (90 Base) MCG/ACT inhaler Inhale into the lungs.     cyclobenzaprine (FLEXERIL) 10 MG tablet Take 10 mg by mouth at bedtime as needed.  0   diltiazem (TIAZAC) 120 MG 24 hr capsule Take 120 mg by mouth daily.     fluticasone (FLONASE) 50 MCG/ACT nasal spray Place 1 spray into both nostrils daily. 9.9 mL 2   gabapentin (NEURONTIN) 300 MG capsule Take 300 mg by mouth 2 (two) times daily.     naproxen (NAPROSYN) 500 MG tablet Take 500 mg by mouth 2 (two) times daily with a meal.     traZODone (DESYREL) 100 MG tablet Take 100 mg by mouth at bedtime.     triamterene-hydrochlorothiazide (MAXZIDE-25) 37.5-25 MG tablet Take 1  tablet by mouth daily.     No current facility-administered medications for this visit.    Allergies:   Patient has no known allergies.    Social History:  The patient  reports that she has been smoking cigarettes. She has been smoking an average of .5 packs per day. She has never been exposed to tobacco smoke. She has never used smokeless tobacco. She reports that she does not currently use alcohol. She reports that she does not use drugs.   Family History:  The patient's family history includes Colon cancer in her brother; Diabetes in her maternal grandmother; Early death in her father; HIV/AIDS in her brother; Hypertension in her mother; Stroke in her mother.    ROS:  Please see the history of present illness.   Otherwise, review of systems are positive for sinus drainage.   All other systems are reviewed  and negative.    PHYSICAL EXAM: VS:  BP (!) 142/86   Pulse (!) 58   Ht '5\' 4"'$  (1.626 m)   Wt 162 lb 6.4 oz (73.7 kg)   SpO2 94%   BMI 27.88 kg/m  , BMI Body mass index is 27.88 kg/m. GEN: Well nourished, well developed, in no acute distress HEENT: normal Neck: no JVD, carotid bruits, or masses Cardiac: RRR; no murmurs, rubs, or gallops,no edema  Respiratory:  clear to auscultation bilaterally, normal work of breathing GI: soft, nontender, nondistended, + BS MS: no deformity or atrophy Skin: warm and dry, no rash Neuro:  Strength and sensation are intact Psych: euthymic mood, full affect   EKG:   The ekg ordered today demonstrates NSR, LVH, inferolateral T wave inversions   Recent Labs: 02/19/2022: ALT 19; BUN 8; Creatinine, Ser 0.92; Hemoglobin 13.5; Platelets 262; Potassium 4.1; Sodium 139   Lipid Panel No results found for: "CHOL", "TRIG", "HDL", "CHOLHDL", "VLDL", "LDLCALC", "LDLDIRECT"   Other studies Reviewed: Additional studies/ records that were reviewed today with results demonstrating: ER records reviewed.   ASSESSMENT AND PLAN:  Abnormal ECG:  LVH. Check echo.  No sx of CHF.  No evidence of volume overload on exam.  She does check her blood pressure at home.  Low-salt diet. HTN:  Has been on Maxide and diltiazem for high blood pressure for many years.  Trying an ACE inhibitor or ARB would be helpful to prevent further thickening of the heart muscle.  Start lisinopril 10 mg daily.  If blood pressure drops too low, would stop diltiazem. Indigestion: not related to exertion.  I don't think this is cardiac related.   Tobacco abuse: trying to stop smoking.  Start Chantix starter.    Check lipids when fasting in a few weeks.  Will also check be made at that time.   Current medicines are reviewed at length with the patient today.  The patient concerns regarding her medicines were addressed.  The following changes have been made:  No change  Labs/ tests ordered  today include:  No orders of the defined types were placed in this encounter.   Recommend 150 minutes/week of aerobic exercise Low fat, low carb, high fiber diet recommended  Disposition:   FU with HTN clinic 3-4 weeks.    Signed, Larae Grooms, MD  05/06/2022 2:09 PM    [facility] [place], [place], [place]  [address] Phone: [phone]; Fax: [phone]

## 2022-05-09 ENCOUNTER — Encounter: Payer: Self-pay | Admitting: Student

## 2022-05-09 ENCOUNTER — Other Ambulatory Visit: Payer: Self-pay | Admitting: Student

## 2022-05-09 DIAGNOSIS — R17 Unspecified jaundice: Secondary | ICD-10-CM

## 2022-05-09 NOTE — Addendum Note (Signed)
Addended by: Janan Halter F on: 05/09/2022 07:48 PM   Modules accepted: Orders

## 2022-05-17 ENCOUNTER — Ambulatory Visit
Admission: RE | Admit: 2022-05-17 | Discharge: 2022-05-17 | Disposition: A | Discharge status: Home / Self Care | Payer: Self-pay | Source: Ambulatory Visit | Attending: Student | Admitting: Student

## 2022-05-17 DIAGNOSIS — R17 Unspecified jaundice: Secondary | ICD-10-CM

## 2022-05-20 ENCOUNTER — Encounter (HOSPITAL_COMMUNITY): Payer: Self-pay | Admitting: Emergency Medicine

## 2022-05-20 ENCOUNTER — Emergency Department (HOSPITAL_COMMUNITY): Payer: 59

## 2022-05-20 ENCOUNTER — Emergency Department (HOSPITAL_COMMUNITY)
Admission: EM | Admit: 2022-05-20 | Discharge: 2022-05-21 | Disposition: A | Discharge status: Home / Self Care | Payer: 59 | Source: Home / Self Care

## 2022-05-20 ENCOUNTER — Other Ambulatory Visit: Payer: 59

## 2022-05-20 ENCOUNTER — Other Ambulatory Visit: Payer: Self-pay

## 2022-05-20 DIAGNOSIS — R1011 Right upper quadrant pain: Secondary | ICD-10-CM | POA: Insufficient documentation

## 2022-05-20 DIAGNOSIS — C250 Malignant neoplasm of head of pancreas: Secondary | ICD-10-CM | POA: Diagnosis not present

## 2022-05-20 DIAGNOSIS — E876 Hypokalemia: Secondary | ICD-10-CM | POA: Diagnosis not present

## 2022-05-20 DIAGNOSIS — Z5321 Procedure and treatment not carried out due to patient leaving prior to being seen by health care provider: Secondary | ICD-10-CM | POA: Insufficient documentation

## 2022-05-20 HISTORY — DX: Calculus of gallbladder without cholecystitis without obstruction: K80.20

## 2022-05-20 LAB — URINALYSIS, ROUTINE W REFLEX MICROSCOPIC
Glucose, UA: NEGATIVE mg/dL
Hgb urine dipstick: NEGATIVE
Ketones, ur: NEGATIVE mg/dL
Leukocytes,Ua: NEGATIVE
Nitrite: NEGATIVE
Protein, ur: NEGATIVE mg/dL
Specific Gravity, Urine: 1.006 (ref 1.005–1.030)
pH: 6 (ref 5.0–8.0)

## 2022-05-20 LAB — CBC WITH DIFFERENTIAL/PLATELET
Basophils Absolute: 0 10*3/uL (ref 0.0–0.1)
Basophils Relative: 0 %
Eosinophils Absolute: 0 10*3/uL (ref 0.0–0.5)
Eosinophils Relative: 0 %
HCT: 34.7 % — ABNORMAL LOW (ref 36.0–46.0)
Hemoglobin: 12.5 g/dL (ref 12.0–15.0)
Lymphocytes Relative: 29 %
Lymphs Abs: 2.8 10*3/uL (ref 0.7–4.0)
MCH: 31.5 pg (ref 26.0–34.0)
MCHC: 36 g/dL (ref 30.0–36.0)
MCV: 87.4 fL (ref 80.0–100.0)
Monocytes Absolute: 0.5 10*3/uL (ref 0.1–1.0)
Monocytes Relative: 5 %
Neutrophils Relative %: 66 %
Platelets: 299 10*3/uL (ref 150–400)
RBC: 3.97 MIL/uL (ref 3.87–5.11)
RDW: 17.9 % — ABNORMAL HIGH (ref 11.5–15.5)
WBC: 9.6 10*3/uL (ref 4.0–10.5)
nRBC: 0 % (ref 0.0–0.2)
nRBC: 0 /100 WBC

## 2022-05-20 LAB — PROTIME-INR
INR: 1.1 (ref 0.8–1.2)
Prothrombin Time: 14 seconds (ref 11.4–15.2)

## 2022-05-20 LAB — BASIC METABOLIC PANEL
Anion gap: 10 (ref 5–15)
BUN: <5 mg/dL — ABNORMAL LOW (ref 6–20)
CO2: 26 mmol/L (ref 22–32)
Calcium: 9.2 mg/dL (ref 8.9–10.3)
Chloride: 97 mmol/L — ABNORMAL LOW (ref 98–111)
Creatinine, Ser: 0.67 mg/dL (ref 0.44–1.00)
GFR, Estimated: >60 mL/min (ref >60)
Glucose, Bld: 94 mg/dL (ref 70–99)
Potassium: 2.9 mmol/L — ABNORMAL LOW (ref 3.5–5.1)
Sodium: 133 mmol/L — ABNORMAL LOW (ref 135–145)

## 2022-05-20 LAB — HEPATIC FUNCTION PANEL
ALT: 460 U/L — ABNORMAL HIGH (ref 0–44)
AST: 293 U/L — ABNORMAL HIGH (ref 15–41)
Albumin: 3.2 g/dL — ABNORMAL LOW (ref 3.5–5.0)
Alkaline Phosphatase: 435 U/L — ABNORMAL HIGH (ref 38–126)
Bilirubin, Direct: 9.3 mg/dL — ABNORMAL HIGH (ref 0.0–0.2)
Indirect Bilirubin: 5.6 mg/dL — ABNORMAL HIGH (ref 0.3–0.9)
Total Bilirubin: 14.9 mg/dL — ABNORMAL HIGH (ref 0.3–1.2)
Total Protein: 7.3 g/dL (ref 6.5–8.1)

## 2022-05-20 LAB — APTT: aPTT: 35 seconds (ref 24–36)

## 2022-05-20 LAB — LIPASE, BLOOD: Lipase: 47 U/L (ref 11–51)

## 2022-05-20 LAB — AMMONIA: Ammonia: 28 umol/L (ref 9–35)

## 2022-05-20 NOTE — ED Triage Notes (Signed)
Patient reports intermittent RUQ pain for several days , [place] result last Tuesday shows gallstones/liver abnormality with jaundice  , no emesis or fever .

## 2022-05-20 NOTE — ED Provider Triage Note (Signed)
Emergency Medicine Provider Triage Evaluation Note  Michelle Williams , a 62 y.o. female  was evaluated in triage.  Pt complains of abnormal ultrasound, right upper quadrant pain, jaundice.  Patient reports 2-week history of worsening jaundice without history of said symptoms prior.  Reports right upper quadrant pain for the past several days.  Denies nausea, vomiting, fever, urinary symptoms, change in bowel habits.  States she has felt slightly more itchy than usual.  Reports consistent alcohol use for the past several years.  Had ultrasound done outpatient which showed widening of CBD of 7.6 mm but patient had no concurrent laboratory studies drawn with said imaging study.  Review of Systems  Positive: See above Negative:   Physical Exam  BP (!) 172/90 (BP Location: Right Arm)   Pulse 71   Temp 98.6 F (37 C)   Resp 15   SpO2 97%  Gen:   Awake, no distress   Resp:  Normal effort  MSK:   Moves extremities without difficulty  Other:  Patient with jaundice skin and scleral icterus.  Mild right upper quadrant tenderness to palpation.  Palpable liver extending down into right side of abdomen.  Medical Decision Making  Medically screening exam initiated at 8:41 PM.  Appropriate orders placed.  Michelle Williams was informed that the remainder of the evaluation will be completed by another provider, this initial triage assessment does not replace that evaluation, and the importance of remaining in the ED until their evaluation is complete.     Wilnette Kales, [place] 05/20/22 2042

## 2022-05-21 ENCOUNTER — Emergency Department (HOSPITAL_COMMUNITY): Payer: 59

## 2022-05-21 ENCOUNTER — Inpatient Hospital Stay (HOSPITAL_COMMUNITY)
Admission: EM | Admit: 2022-05-21 | Discharge: 2022-05-28 | DRG: 436 | Disposition: A | Discharge status: Home / Self Care | Payer: 59 | Attending: Infectious Diseases | Admitting: Infectious Diseases

## 2022-05-21 ENCOUNTER — Other Ambulatory Visit: Payer: Self-pay

## 2022-05-21 ENCOUNTER — Inpatient Hospital Stay (HOSPITAL_COMMUNITY): Payer: 59

## 2022-05-21 DIAGNOSIS — Z8541 Personal history of malignant neoplasm of cervix uteri: Secondary | ICD-10-CM

## 2022-05-21 DIAGNOSIS — E440 Moderate protein-calorie malnutrition: Secondary | ICD-10-CM | POA: Diagnosis not present

## 2022-05-21 DIAGNOSIS — K831 Obstruction of bile duct: Secondary | ICD-10-CM | POA: Diagnosis not present

## 2022-05-21 DIAGNOSIS — I700 Atherosclerosis of aorta: Secondary | ICD-10-CM | POA: Diagnosis present

## 2022-05-21 DIAGNOSIS — I10 Essential (primary) hypertension: Secondary | ICD-10-CM | POA: Diagnosis not present

## 2022-05-21 DIAGNOSIS — Z823 Family history of stroke: Secondary | ICD-10-CM | POA: Diagnosis not present

## 2022-05-21 DIAGNOSIS — Z8719 Personal history of other diseases of the digestive system: Secondary | ICD-10-CM | POA: Diagnosis not present

## 2022-05-21 DIAGNOSIS — Z833 Family history of diabetes mellitus: Secondary | ICD-10-CM | POA: Diagnosis not present

## 2022-05-21 DIAGNOSIS — K8021 Calculus of gallbladder without cholecystitis with obstruction: Secondary | ICD-10-CM | POA: Diagnosis present

## 2022-05-21 DIAGNOSIS — R9431 Abnormal electrocardiogram [ECG] [EKG]: Secondary | ICD-10-CM | POA: Diagnosis present

## 2022-05-21 DIAGNOSIS — K8020 Calculus of gallbladder without cholecystitis without obstruction: Secondary | ICD-10-CM

## 2022-05-21 DIAGNOSIS — E872 Acidosis, unspecified: Secondary | ICD-10-CM | POA: Diagnosis not present

## 2022-05-21 DIAGNOSIS — Z66 Do not resuscitate: Secondary | ICD-10-CM | POA: Diagnosis present

## 2022-05-21 DIAGNOSIS — Z830 Family history of human immunodeficiency virus [HIV] disease: Secondary | ICD-10-CM | POA: Diagnosis not present

## 2022-05-21 DIAGNOSIS — Z79899 Other long term (current) drug therapy: Secondary | ICD-10-CM | POA: Diagnosis not present

## 2022-05-21 DIAGNOSIS — E860 Dehydration: Secondary | ICD-10-CM | POA: Diagnosis not present

## 2022-05-21 DIAGNOSIS — C250 Malignant neoplasm of head of pancreas: Principal | ICD-10-CM | POA: Diagnosis present

## 2022-05-21 DIAGNOSIS — F17210 Nicotine dependence, cigarettes, uncomplicated: Secondary | ICD-10-CM | POA: Diagnosis present

## 2022-05-21 DIAGNOSIS — Z8249 Family history of ischemic heart disease and other diseases of the circulatory system: Secondary | ICD-10-CM

## 2022-05-21 DIAGNOSIS — K838 Other specified diseases of biliary tract: Secondary | ICD-10-CM | POA: Diagnosis not present

## 2022-05-21 DIAGNOSIS — J439 Emphysema, unspecified: Secondary | ICD-10-CM | POA: Diagnosis present

## 2022-05-21 DIAGNOSIS — R933 Abnormal findings on diagnostic imaging of other parts of digestive tract: Secondary | ICD-10-CM | POA: Insufficient documentation

## 2022-05-21 DIAGNOSIS — R7989 Other specified abnormal findings of blood chemistry: Secondary | ICD-10-CM

## 2022-05-21 DIAGNOSIS — K8042 Calculus of bile duct with acute cholecystitis without obstruction: Secondary | ICD-10-CM

## 2022-05-21 DIAGNOSIS — Z800 Family history of malignant neoplasm of digestive organs: Secondary | ICD-10-CM | POA: Diagnosis not present

## 2022-05-21 DIAGNOSIS — R911 Solitary pulmonary nodule: Secondary | ICD-10-CM | POA: Diagnosis present

## 2022-05-21 DIAGNOSIS — R17 Unspecified jaundice: Secondary | ICD-10-CM

## 2022-05-21 DIAGNOSIS — E876 Hypokalemia: Secondary | ICD-10-CM | POA: Diagnosis present

## 2022-05-21 DIAGNOSIS — F419 Anxiety disorder, unspecified: Secondary | ICD-10-CM | POA: Diagnosis not present

## 2022-05-21 DIAGNOSIS — N179 Acute kidney failure, unspecified: Secondary | ICD-10-CM | POA: Diagnosis not present

## 2022-05-21 DIAGNOSIS — D649 Anemia, unspecified: Secondary | ICD-10-CM | POA: Diagnosis not present

## 2022-05-21 DIAGNOSIS — E871 Hypo-osmolality and hyponatremia: Secondary | ICD-10-CM | POA: Diagnosis not present

## 2022-05-21 LAB — BASIC METABOLIC PANEL
Anion gap: 10 (ref 5–15)
BUN: 5 mg/dL — ABNORMAL LOW (ref 6–20)
CO2: 25 mmol/L (ref 22–32)
Calcium: 8.8 mg/dL — ABNORMAL LOW (ref 8.9–10.3)
Chloride: 100 mmol/L (ref 98–111)
Creatinine, Ser: 0.6 mg/dL (ref 0.44–1.00)
GFR, Estimated: >60 mL/min (ref >60)
Glucose, Bld: 138 mg/dL — ABNORMAL HIGH (ref 70–99)
Potassium: 2.9 mmol/L — ABNORMAL LOW (ref 3.5–5.1)
Sodium: 135 mmol/L (ref 135–145)

## 2022-05-21 LAB — CBC WITH DIFFERENTIAL/PLATELET
Abs Immature Granulocytes: 0.03 10*3/uL (ref 0.00–0.07)
Basophils Absolute: 0 10*3/uL (ref 0.0–0.1)
Basophils Relative: 0 %
Eosinophils Absolute: 0.1 10*3/uL (ref 0.0–0.5)
Eosinophils Relative: 1 %
HCT: 34.6 % — ABNORMAL LOW (ref 36.0–46.0)
Hemoglobin: 12.3 g/dL (ref 12.0–15.0)
Immature Granulocytes: 0 %
Lymphocytes Relative: 19 %
Lymphs Abs: 1.8 10*3/uL (ref 0.7–4.0)
MCH: 31.6 pg (ref 26.0–34.0)
MCHC: 35.5 g/dL (ref 30.0–36.0)
MCV: 88.9 fL (ref 80.0–100.0)
Monocytes Absolute: 0.8 10*3/uL (ref 0.1–1.0)
Monocytes Relative: 8 %
Neutro Abs: 7 10*3/uL (ref 1.7–7.7)
Neutrophils Relative %: 72 %
Platelets: 305 10*3/uL (ref 150–400)
RBC: 3.89 MIL/uL (ref 3.87–5.11)
RDW: 17.9 % — ABNORMAL HIGH (ref 11.5–15.5)
WBC: 9.7 10*3/uL (ref 4.0–10.5)
nRBC: 0 % (ref 0.0–0.2)

## 2022-05-21 LAB — HEPATIC FUNCTION PANEL
ALT: 428 U/L — ABNORMAL HIGH (ref 0–44)
AST: 273 U/L — ABNORMAL HIGH (ref 15–41)
Albumin: 2.9 g/dL — ABNORMAL LOW (ref 3.5–5.0)
Alkaline Phosphatase: 430 U/L — ABNORMAL HIGH (ref 38–126)
Bilirubin, Direct: 9.1 mg/dL — ABNORMAL HIGH (ref 0.0–0.2)
Indirect Bilirubin: 5.3 mg/dL — ABNORMAL HIGH (ref 0.3–0.9)
Total Bilirubin: 14.4 mg/dL — ABNORMAL HIGH (ref 0.3–1.2)
Total Protein: 6.9 g/dL (ref 6.5–8.1)

## 2022-05-21 LAB — LIPASE, BLOOD: Lipase: 46 U/L (ref 11–51)

## 2022-05-21 LAB — MAGNESIUM: Magnesium: 1.8 mg/dL (ref 1.7–2.4)

## 2022-05-21 LAB — HIV ANTIBODY (ROUTINE TESTING W REFLEX): HIV Screen 4th Generation wRfx: NONREACTIVE

## 2022-05-21 MED ORDER — FENTANYL CITRATE PF 50 MCG/ML IJ SOSY
50.0000 ug | PREFILLED_SYRINGE | Freq: Once | INTRAMUSCULAR | Status: AC
  Administered 2022-05-21: 50 ug via INTRAVENOUS
  Filled 2022-05-21: qty 1

## 2022-05-21 MED ORDER — POTASSIUM CHLORIDE 10 MEQ/100ML IV SOLN
10.0000 meq | INTRAVENOUS | Status: AC
  Administered 2022-05-21 (×4): 10 meq via INTRAVENOUS
  Filled 2022-05-21 (×4): qty 100

## 2022-05-21 MED ORDER — MONTELUKAST SODIUM 10 MG PO TABS
10.0000 mg | ORAL_TABLET | Freq: Every day | ORAL | Status: DC
  Administered 2022-05-21 – 2022-05-27 (×7): 10 mg via ORAL
  Filled 2022-05-21 (×7): qty 1

## 2022-05-21 MED ORDER — VARENICLINE TARTRATE 0.5 MG PO TABS
0.5000 mg | ORAL_TABLET | Freq: Two times a day (BID) | ORAL | Status: DC
  Administered 2022-05-22 – 2022-05-28 (×10): 0.5 mg via ORAL
  Filled 2022-05-21 (×16): qty 1

## 2022-05-21 MED ORDER — ENOXAPARIN SODIUM 40 MG/0.4ML IJ SOSY: 40.0000 mg | PREFILLED_SYRINGE | INTRAMUSCULAR | Status: DC

## 2022-05-21 MED ORDER — LACTATED RINGERS IV BOLUS
1000.0000 mL | Freq: Once | INTRAVENOUS | Status: AC
  Administered 2022-05-21: 1000 mL via INTRAVENOUS

## 2022-05-21 MED ORDER — LISINOPRIL 10 MG PO TABS
10.0000 mg | ORAL_TABLET | Freq: Every day | ORAL | Status: DC
  Administered 2022-05-22 – 2022-05-26 (×4): 10 mg via ORAL
  Filled 2022-05-21 (×7): qty 1

## 2022-05-21 MED ORDER — IOHEXOL 350 MG/ML SOLN
75.0000 mL | Freq: Once | INTRAVENOUS | Status: AC | PRN
  Administered 2022-05-21: 75 mL via INTRAVENOUS

## 2022-05-21 MED ORDER — ALBUTEROL SULFATE (2.5 MG/3ML) 0.083% IN NEBU: 3.0000 mL | INHALATION_SOLUTION | Freq: Four times a day (QID) | RESPIRATORY_TRACT | Status: DC | PRN

## 2022-05-21 MED ORDER — ONDANSETRON HCL 4 MG/2ML IJ SOLN
4.0000 mg | Freq: Once | INTRAMUSCULAR | Status: AC
  Administered 2022-05-21: 4 mg via INTRAVENOUS
  Filled 2022-05-21: qty 2

## 2022-05-21 NOTE — ED Notes (Signed)
Patient left on own accord °

## 2022-05-21 NOTE — ED Notes (Signed)
ED TO INPATIENT HANDOFF REPORT  ED Nurse Name and Phone #:  [phone]  S Name/Age/Gender Michelle Williams 62 y.o. female Room/Bed: 035C/035C  Code Status   Code Status: DNR  Home/SNF/Other Home Patient oriented to: self, place, time, and situation Is this baseline? Yes   Triage Complete: Triage complete  Chief Complaint Obstructive jaundice [K83.1]  Triage Note Pt reports RUQ pain. Pt stating she wants her results from last night. Reports her liver enzymes were elevated.    Allergies No Known Allergies  Level of Care/Admitting Diagnosis ED Disposition     ED Disposition  Admit   Condition  --   Comment  Hospital Area: [facility] [[id number]]  Level of Care: Med-Surg [16]  May admit patient to [facility] or [facility] if equivalent level of care is available:: No  Covid Evaluation: Asymptomatic - no recent exposure (last 10 days) testing not required  Diagnosis: Obstructive jaundice [709956]  Admitting Physician: Aldine Contes [[id number]]  Attending Physician: Aldine Contes [[id number]]  Certification:: I certify this patient will need inpatient services for at least 2 midnights  Estimated Length of Stay: 2          B Medical/Surgery History Past Medical History:  Diagnosis Date   Acute bronchitis    Allergy    Anemia    Anxiety    Arthritis    Cataract    BEGINIING   Cervical cancer ([place])    Degenerative joint disease    UPPER,LOWER BACK   Gallstones    H/O degenerative disc disease    H/O rheumatoid arthritis    Hypertension    Neuropathy    BILATERAL   Past Surgical History:  Procedure Laterality Date   BREAST EXCISIONAL BIOPSY Right    COLONOSCOPY     2018   IR FALLOPIAN TUBE CATHETERIZATION     POLYPECTOMY       A IV Location/Drains/Wounds Patient Lines/Drains/Airways Status     Active Line/Drains/Airways     Name Placement date Placement time Site Days   Peripheral IV 05/21/22 20 G Anterior;Proximal;Right  Forearm 05/21/22  1218  Forearm  less than 1            Intake/Output Last 24 hours No intake or output data in the 24 hours ending 05/21/22 1648  Labs/Imaging Results for orders placed or performed during the hospital encounter of 05/21/22 (from the past 48 hour(s))  CBC with Differential     Status: Abnormal   Collection Time: 05/21/22 12:24 PM  Result Value Ref Range   WBC 9.7 4.0 - 10.5 K/uL   RBC 3.89 3.87 - 5.11 MIL/uL   Hemoglobin 12.3 12.0 - 15.0 g/dL   HCT 34.6 (L) 36.0 - 46.0 %   MCV 88.9 80.0 - 100.0 fL   MCH 31.6 26.0 - 34.0 pg   MCHC 35.5 30.0 - 36.0 g/dL   RDW 17.9 (H) 11.5 - 15.5 %   Platelets 305 150 - 400 K/uL   nRBC 0.0 0.0 - 0.2 %   Neutrophils Relative % 72 %   Neutro Abs 7.0 1.7 - 7.7 K/uL   Lymphocytes Relative 19 %   Lymphs Abs 1.8 0.7 - 4.0 K/uL   Monocytes Relative 8 %   Monocytes Absolute 0.8 0.1 - 1.0 K/uL   Eosinophils Relative 1 %   Eosinophils Absolute 0.1 0.0 - 0.5 K/uL   Basophils Relative 0 %   Basophils Absolute 0.0 0.0 - 0.1 K/uL   Immature Granulocytes 0 %  Abs Immature Granulocytes 0.03 0.00 - 0.07 K/uL    Comment: Performed at [facility], [place] [address], [place], [place] [address]  Basic metabolic panel     Status: Abnormal   Collection Time: 05/21/22 12:24 PM  Result Value Ref Range   Sodium 135 135 - 145 mmol/L   Potassium 2.9 (L) 3.5 - 5.1 mmol/L   Chloride 100 98 - 111 mmol/L   CO2 25 22 - 32 mmol/L   Glucose, Bld 138 (H) 70 - 99 mg/dL    Comment: Glucose reference range applies only to samples taken after fasting for at least 8 hours.   BUN 5 (L) 6 - 20 mg/dL   Creatinine, Ser 0.60 0.44 - 1.00 mg/dL   Calcium 8.8 (L) 8.9 - 10.3 mg/dL   GFR, Estimated >60 >60 mL/min    Comment: (NOTE) Calculated using the CKD-EPI Creatinine Equation (2021)    Anion gap 10 5 - 15    Comment: Performed at [facility] [address], [place], [place] [address]  Hepatic function panel     Status: Abnormal   Collection  Time: 05/21/22 12:24 PM  Result Value Ref Range   Total Protein 6.9 6.5 - 8.1 g/dL   Albumin 2.9 (L) 3.5 - 5.0 g/dL   AST 273 (H) 15 - 41 U/L   ALT 428 (H) 0 - 44 U/L   Alkaline Phosphatase 430 (H) 38 - 126 U/L   Total Bilirubin 14.4 (H) 0.3 - 1.2 mg/dL   Bilirubin, Direct 9.1 (H) 0.0 - 0.2 mg/dL   Indirect Bilirubin 5.3 (H) 0.3 - 0.9 mg/dL    Comment: Performed at [facility] [address], [place], [place] [address]  Lipase, blood     Status: None   Collection Time: 05/21/22 12:24 PM  Result Value Ref Range   Lipase 46 11 - 51 U/L    Comment: Performed at [facility] [address], [place], [place] [address]   CT ABDOMEN PELVIS W CONTRAST  Result Date: 05/21/2022 CLINICAL DATA:  Right upper quadrant pain EXAM: CT ABDOMEN AND PELVIS WITH CONTRAST TECHNIQUE: Multidetector CT imaging of the abdomen and pelvis was performed using the standard protocol following bolus administration of intravenous contrast. RADIATION DOSE REDUCTION: This exam was performed according to the departmental dose-optimization program which includes automated exposure control, adjustment of the mA and/or kV according to patient size and/or use of iterative reconstruction technique. CONTRAST:  28m OMNIPAQUE IOHEXOL 350 MG/ML SOLN COMPARISON:  Abdominal sonogram 05/20/2022 and CT AP 08/01/2012 FINDINGS: Lower chest: Atelectasis and ground-glass attenuation noted within the posterior lung bases. No pleural fluid or airspace disease. Subpleural nodule in the anterior right upper lobe measures 2 mm Hepatobiliary: No focal liver lesion identified. Stones noted on recent gallbladder ultrasound are not CT visible. There is no gallbladder wall thickening or pericholecystic fluid identified. Dilatation of the proximal common bile duct measures up to 1.3 cm. Abrupt termination the dilated common bile duct at the level of the head of pancreas, image 42/7. Pancreas: Unremarkable. No pancreatic ductal dilatation or  surrounding inflammatory changes. Spleen: Normal in size without focal abnormality. Adrenals/Urinary Tract: Adrenal glands are unremarkable. Kidneys are normal, without renal calculi, focal lesion, or hydronephrosis. Bladder is unremarkable. Stomach/Bowel: Stomach is normal. The appendix is not confidently identified. No bowel wall thickening, inflammation, or distension. Vascular/Lymphatic: Aortic atherosclerosis. No aneurysm. No signs abdominopelvic adenopathy. Reproductive: Uterus and bilateral adnexa are unremarkable. Other: No ascites or focal  fluid collections. No signs of pneumoperitoneum. Musculoskeletal: No acute or significant osseous findings. IMPRESSION: 1. Dilatation of the proximal common bile duct measures up to 1.3 cm. Abrupt termination the dilated common bile duct at the level of the head of pancreas. There is also main duct dilatation which abruptly terminates at the level of the head of pancreas, proximal to the ampulla. Imaging findings are concerning for underlying obstructing lesion within the head of pancreas. Further evaluation with contrast enhanced MRI/MRCP is advised. 2. Stones noted on recent gallbladder ultrasound are not CT visible. There is no gallbladder wall thickening or pericholecystic fluid identified. 3. 2 mm right upper lobe lung nodule. Nonspecific. See lung cancer screening CT from 04/15/2022 for follow-up recommendations. 4.  Aortic Atherosclerosis (ICD10-I70.0). Electronically Signed   By: Kerby Moors M.D.   On: 05/21/2022 13:50   US Abdomen Limited RUQ (LIVER/GB)  Result Date: 05/20/2022 CLINICAL DATA:  Pain. EXAM: ULTRASOUND ABDOMEN LIMITED RIGHT UPPER QUADRANT COMPARISON:  05/17/2022 FINDINGS: Gallbladder: Cholelithiasis with multiple stones filling the gallbladder. No gallbladder wall thickening or edema. Murphy's sign is negative. Common bile duct: Diameter: 13 mm, dilated and enlarging since previous study. Liver: No focal lesion identified. Within normal  limits in parenchymal echogenicity. Portal vein is patent on color Doppler imaging with normal direction of blood flow towards the liver. Other: None. IMPRESSION: 1. Cholelithiasis without additional changes to suggest acute cholecystitis. 2. Dilated common bile duct at 13 mm, increasing since previous study. Consider MRCP to exclude common duct stone or obstruction. Electronically Signed   By: Lucienne Capers M.D.   On: 05/20/2022 21:58    Pending Labs Unresulted Labs (From admission, onward)     Start     Ordered   05/22/22 0500  Comprehensive metabolic panel  Tomorrow morning,   R        05/21/22 1646   05/22/22 0500  CBC  Tomorrow morning,   R        05/21/22 1646   05/21/22 1644  HIV Antibody (routine testing w rflx)  (HIV Antibody (Routine testing w reflex) panel)  Once,   R        05/21/22 1646   05/21/22 1636  Magnesium  Once,   STAT        05/21/22 1635            Vitals/Pain Today's Vitals   05/21/22 1300 05/21/22 1315 05/21/22 1417 05/21/22 1445  BP: (!) 143/89 (!) 149/91 (!) 146/92 (!) 154/96  Pulse: 67 66 77 70  Resp: 17 (!) '22 12 12  '$ Temp:    98.4 F (36.9 C)  TempSrc:    Oral  SpO2: 96% 99% 97% 99%    Isolation Precautions No active isolations  Medications Medications  potassium chloride 10 mEq in 100 mL IVPB (10 mEq Intravenous New Bag/Given 05/21/22 1645)  enoxaparin (LOVENOX) injection 40 mg (has no administration in time range)  lactated ringers bolus 1,000 mL (0 mLs Intravenous Stopped 05/21/22 1523)  ondansetron (ZOFRAN) injection 4 mg (4 mg Intravenous Given 05/21/22 1218)  fentaNYL (SUBLIMAZE) injection 50 mcg (50 mcg Intravenous Given 05/21/22 1220)  iohexol (OMNIPAQUE) 350 MG/ML injection 75 mL (75 mLs Intravenous Contrast Given 05/21/22 1330)    Mobility walks     Focused Assessments    R Recommendations: See Admitting Provider Note  Report given to:   Additional Notes:

## 2022-05-21 NOTE — Hospital Course (Addendum)
Obstructive jaundice 2/2 lesion near head of pancreas Conjugated hyperbilirubinemia Elevated LFTs and alk phos Nausea and vomiting Patient presented with jaundice, RUQ pain, and diarrhea over preceding 4 weeks. Presenting labs significant for bilirubin 14.9 and direct bilirubin 9.3, indirect bilirubin 5.6, AST/ALT 293/460, alk phos 435. UA with small bilirubin. RUQ ultrasound showed cholelithiasis without additional changes to suggest acute cholecystitis and dilated common bile duct at 13 mm, increasing since previous study. CT abdomen/pelvis revealed dilatation of the proximal common bile duct measuring up to 1.3 cm, abrupt termination the dilated common bile duct at the level of the head of pancreas, main duct dilatation which abruptly terminates at the level of the head of pancreas, proximal to the ampulla, and no gallbladder wall thickening or pericholecystic fluid identified. GI was consulted with recommendation of MRCP. MRCP was attempted was unsuccessful due to premature cessation due to patient anxiety but in the limited study did show marked intrahepatic and common bile duct dilatation, abrupt termination of the common bile duct within the head of pancreas, diffuse main duct dilatation up to the level of the head of pancreas which measures 6 mm in maximum diameter, a dilated main duct that terminates at the same level as the termination of the CBD, with the signal abnormality measuring approximately 1.9 x 0.7 cm. ERCP was attempted however unable to cannulate the ducts. She underwent image-guided drain placement with externalized biliary drain 01/24 and hepatic enzymes showed improvement with drainage of bile. Culture obtained during this procedure has no growth at time of discharge. On 01/26 she underwent exchange of the biliary drain with biliary brush biopsy and tolerated the procedure well. CA 19-9 was elevated at 158, hepatic function was stable and improved from admission. She was discharged after  receiving education regarding her drain by the interventional radiology team. Per GI, she will need EUS or FNA for diagnosis if the brush biopsy is negative or inconclusive. She will need oncology consult once biopsy.   Hypokalemia, resolved Abnormal EKG Presenting labs with potassium 2.9 and no EKG changes from prior EKG 01/2022. This was thought to be 2/2 GI losses. Potassium was supplemented as needed throughout admission.   Hypertension On presentation, patient was mildly hypertensive and home lisinopril was continued. There was some uncertainty regarding her home regimen beyond lisinopril, and given response to monotherapy, diltiazem and triamterene-HCTZ was held. BP was overall stable throughout admission. She was advised to restart her home anti-hypertensive regimen at discharge.   COPD Tobacco use disorder Pulmonary nodule CT abdomen/pelvis revealed 2 mm RUL lung nodule consistent with a 2.6 mm anterior RML nodule seen on lung cancer screening CT with lung-RADS 2, recommended for repeat low-dose CT in 12 months. Continued home Singulair, chantix, and PRN albuterol.

## 2022-05-21 NOTE — ED Provider Notes (Signed)
[facility] Provider Note   CSN: [id number] Arrival date & time: 05/21/22  8937     History  Chief Complaint  Patient presents with   Abdominal Pain    Michelle Williams is a 62 y.o. female.   Abdominal Pain Associated symptoms: nausea      61 year old female with medical history significant for gallstones who presents to the emergency department with several weeks of symptoms of jaundice.  Reports a 2 to 3-week history of worsening jaundice.  She endorses some right upper quadrant pain which has been ongoing for the past several days.  She denies any vomiting, fever or chills.  She denies any diarrhea.  She is passing gas.  She endorses some itchiness.  She had an ultrasound done outpatient which showed a widening of her CBD.  She presented initially last night to the emergency department had labs and imaging done and left prior to being evaluated.  She presents back today for further evaluation.  Labs drawn overnight showed evidence of obstructive jaundice with a T. bili of 14.9 acutely elevated from 3 months ago at normal, direct bili elevated at 9.3, elevated LFTs with an AST of 293, ALT of 468, elevated alkaline phosphatase to 435.  She had an ultrasound done overnight which showed cholelithiasis without changes suggestive of cholecystitis.  She had a dilated CBD last night at 13 mm.  Denies any weight loss.  She does endorse some mild nausea.  Home Medications Prior to Admission medications   Medication Sig Start Date End Date Taking? Authorizing Provider  albuterol (VENTOLIN HFA) 108 (90 Base) MCG/ACT inhaler Inhale into the lungs. 02/25/22  Yes [provider]  cyclobenzaprine (FLEXERIL) 10 MG tablet Take 10 mg by mouth at bedtime. 12/12/16  Yes [provider]  fluticasone (FLONASE) 50 MCG/ACT nasal spray Place 1 spray into both nostrils daily. Patient taking differently: Place 1 spray into both nostrils daily as  needed for allergies. 02/19/22  Yes Nanavati, Ankit, MD  levocetirizine (XYZAL) 5 MG tablet Take 5 mg by mouth at bedtime.   Yes [provider]  lisinopril (ZESTRIL) 10 MG tablet Take 1 tablet (10 mg total) by mouth daily. 05/06/22  Yes Jettie Booze, MD  montelukast (SINGULAIR) 10 MG tablet Take 10 mg by mouth at bedtime.   Yes [provider]  traZODone (DESYREL) 100 MG tablet Take 100 mg by mouth at bedtime. 03/28/22  Yes [provider]  Varenicline Tartrate, Starter, (CHANTIX STARTING MONTH PAK) 0.5 MG X 11 & 1 MG X 42 TBPK Days 1 to 3: 0.5 mg once daily. Days 4 to 7: 0.5 mg twice daily. Maintenance (day 8 and later): 1 mg twice daily Patient taking differently: Take 0.5 mg by mouth 2 (two) times daily. 05/06/22  Yes Jettie Booze, MD      Allergies    Patient has no known allergies.    Review of Systems   Review of Systems  Gastrointestinal:  Positive for abdominal pain and nausea.  Skin:  Positive for color change.  All other systems reviewed and are negative.   Physical Exam Updated Vital Signs BP (!) 154/96 (BP Location: Left Arm)   Pulse 70   Temp 98.4 F (36.9 C) (Oral)   Resp 12   SpO2 99%  Physical Exam Vitals and nursing note reviewed.  Constitutional:      General: She is not in acute distress.    Appearance: She is well-developed. She  is obese.  HENT:     Head: Normocephalic and atraumatic.  Eyes:     General: Scleral icterus present.     Conjunctiva/sclera: Conjunctivae normal.  Cardiovascular:     Rate and Rhythm: Normal rate and regular rhythm.     Heart sounds: No murmur heard. Pulmonary:     Effort: Pulmonary effort is normal. No respiratory distress.     Breath sounds: Normal breath sounds.  Abdominal:     Palpations: Abdomen is soft.     Tenderness: There is abdominal tenderness in the right upper quadrant and epigastric area.  Musculoskeletal:        General: No swelling.     Cervical back: Neck supple.   Skin:    General: Skin is warm and dry.     Capillary Refill: Capillary refill takes less than 2 seconds.     Coloration: Skin is jaundiced.  Neurological:     Mental Status: She is alert.  Psychiatric:        Mood and Affect: Mood normal.     ED Results / Procedures / Treatments   Labs (all labs ordered are listed, but only abnormal results are displayed) Labs Reviewed  CBC WITH DIFFERENTIAL/PLATELET - Abnormal; Notable for the following components:      Result Value   HCT 34.6 (*)    RDW 17.9 (*)    All other components within normal limits  BASIC METABOLIC PANEL - Abnormal; Notable for the following components:   Potassium 2.9 (*)    Glucose, Bld 138 (*)    BUN 5 (*)    Calcium 8.8 (*)    All other components within normal limits  HEPATIC FUNCTION PANEL - Abnormal; Notable for the following components:   Albumin 2.9 (*)    AST 273 (*)    ALT 428 (*)    Alkaline Phosphatase 430 (*)    Total Bilirubin 14.4 (*)    Bilirubin, Direct 9.1 (*)    Indirect Bilirubin 5.3 (*)    All other components within normal limits  LIPASE, BLOOD  MAGNESIUM  HIV ANTIBODY (ROUTINE TESTING W REFLEX)    EKG None  Radiology CT ABDOMEN PELVIS W CONTRAST  Result Date: 05/21/2022 CLINICAL DATA:  Right upper quadrant pain EXAM: CT ABDOMEN AND PELVIS WITH CONTRAST TECHNIQUE: Multidetector CT imaging of the abdomen and pelvis was performed using the standard protocol following bolus administration of intravenous contrast. RADIATION DOSE REDUCTION: This exam was performed according to the departmental dose-optimization program which includes automated exposure control, adjustment of the mA and/or kV according to patient size and/or use of iterative reconstruction technique. CONTRAST:  6m OMNIPAQUE IOHEXOL 350 MG/ML SOLN COMPARISON:  Abdominal sonogram 05/20/2022 and CT AP 08/01/2012 FINDINGS: Lower chest: Atelectasis and ground-glass attenuation noted within the posterior lung bases. No pleural  fluid or airspace disease. Subpleural nodule in the anterior right upper lobe measures 2 mm Hepatobiliary: No focal liver lesion identified. Stones noted on recent gallbladder ultrasound are not CT visible. There is no gallbladder wall thickening or pericholecystic fluid identified. Dilatation of the proximal common bile duct measures up to 1.3 cm. Abrupt termination the dilated common bile duct at the level of the head of pancreas, image 42/7. Pancreas: Unremarkable. No pancreatic ductal dilatation or surrounding inflammatory changes. Spleen: Normal in size without focal abnormality. Adrenals/Urinary Tract: Adrenal glands are unremarkable. Kidneys are normal, without renal calculi, focal lesion, or hydronephrosis. Bladder is unremarkable. Stomach/Bowel: Stomach is normal. The appendix is not confidently identified. No bowel  wall thickening, inflammation, or distension. Vascular/Lymphatic: Aortic atherosclerosis. No aneurysm. No signs abdominopelvic adenopathy. Reproductive: Uterus and bilateral adnexa are unremarkable. Other: No ascites or focal fluid collections. No signs of pneumoperitoneum. Musculoskeletal: No acute or significant osseous findings. IMPRESSION: 1. Dilatation of the proximal common bile duct measures up to 1.3 cm. Abrupt termination the dilated common bile duct at the level of the head of pancreas. There is also main duct dilatation which abruptly terminates at the level of the head of pancreas, proximal to the ampulla. Imaging findings are concerning for underlying obstructing lesion within the head of pancreas. Further evaluation with contrast enhanced MRI/MRCP is advised. 2. Stones noted on recent gallbladder ultrasound are not CT visible. There is no gallbladder wall thickening or pericholecystic fluid identified. 3. 2 mm right upper lobe lung nodule. Nonspecific. See lung cancer screening CT from 04/15/2022 for follow-up recommendations. 4.  Aortic Atherosclerosis (ICD10-I70.0).  Electronically Signed   By: Kerby Moors M.D.   On: 05/21/2022 13:50   US Abdomen Limited RUQ (LIVER/GB)  Result Date: 05/20/2022 CLINICAL DATA:  Pain. EXAM: ULTRASOUND ABDOMEN LIMITED RIGHT UPPER QUADRANT COMPARISON:  05/17/2022 FINDINGS: Gallbladder: Cholelithiasis with multiple stones filling the gallbladder. No gallbladder wall thickening or edema. Murphy's sign is negative. Common bile duct: Diameter: 13 mm, dilated and enlarging since previous study. Liver: No focal lesion identified. Within normal limits in parenchymal echogenicity. Portal vein is patent on color Doppler imaging with normal direction of blood flow towards the liver. Other: None. IMPRESSION: 1. Cholelithiasis without additional changes to suggest acute cholecystitis. 2. Dilated common bile duct at 13 mm, increasing since previous study. Consider MRCP to exclude common duct stone or obstruction. Electronically Signed   By: Lucienne Capers M.D.   On: 05/20/2022 21:58    Procedures Procedures    Medications Ordered in ED Medications  potassium chloride 10 mEq in 100 mL IVPB (10 mEq Intravenous New Bag/Given 05/21/22 1645)  enoxaparin (LOVENOX) injection 40 mg (has no administration in time range)  lactated ringers bolus 1,000 mL (0 mLs Intravenous Stopped 05/21/22 1523)  ondansetron (ZOFRAN) injection 4 mg (4 mg Intravenous Given 05/21/22 1218)  fentaNYL (SUBLIMAZE) injection 50 mcg (50 mcg Intravenous Given 05/21/22 1220)  iohexol (OMNIPAQUE) 350 MG/ML injection 75 mL (75 mLs Intravenous Contrast Given 05/21/22 1330)    ED Course/ Medical Decision Making/ A&P                             Medical Decision Making Amount and/or Complexity of Data Reviewed Labs: ordered. Radiology: ordered. ECG/medicine tests: ordered.  Risk Prescription drug management. Decision regarding hospitalization.    62 year old female with medical history significant for gallstones who presents to the emergency department with several weeks  of symptoms of jaundice.  Reports a 2 to 3-week history of worsening jaundice.  She endorses some right upper quadrant pain which has been ongoing for the past several days.  She denies any vomiting, fever or chills.  She denies any diarrhea.  She is passing gas.  She endorses some itchiness.  She had an ultrasound done outpatient which showed a widening of her CBD.  She presented initially last night to the emergency department had labs and imaging done and left prior to being evaluated.  She presents back today for further evaluation.  Labs drawn overnight showed evidence of obstructive jaundice with a T. bili of 14.9 acutely elevated from 3 months ago at normal, direct bili elevated at 9.3,  elevated LFTs with an AST of 293, ALT of 468, elevated alkaline phosphatase to 435.  She had an ultrasound done overnight which showed cholelithiasis without changes suggestive of cholecystitis.  She had a dilated CBD last night at 13 mm.  Denies any weight loss.  She does endorse some mild nausea.  On arrival, the patient was afebrile, not tachycardic or tachypneic, BP 139/98, saturating 98% on room air.  Physical exam significant for jaundiced patient with right upper quadrant tenderness to palpation.  Differential diagnosis includes cholelithiasis/cholecystitis, choledocholithiasis, less likely ascending cholangitis, considered pancreatic mass.  Laboratory evaluation significant for CBC without a leukocytosis or anemia, BMP with hypokalemia to 2.9, replenished IV, no evidence of AKI with a serum creatinine of 0.6, hepatic function panel with persistent evidence of biliary obstruction with a T. bili of 14.4, direct bili of 9.1, elevated AST to 273, ALT to 428, alkaline phosphatase at 430.  CT abdomen pelvis was performed to evaluated for a neoplastic mass: IMPRESSION: 1. Dilatation of the proximal common bile duct measures up to 1.3 cm. Abrupt termination the dilated common bile duct at the level of the head of  pancreas. There is also main duct dilatation which abruptly terminates at the level of the head of pancreas, proximal to the ampulla. Imaging findings are concerning for underlying obstructing lesion within the head of pancreas. Further evaluation with contrast enhanced MRI/MRCP is advised. 2. Stones noted on recent gallbladder ultrasound are not CT visible. There is no gallbladder wall thickening or pericholecystic fluid identified. 3. 2 mm right upper lobe lung nodule. Nonspecific. See lung cancer screening CT from 04/15/2022 for follow-up recommendations. 4.  Aortic Atherosclerosis (ICD10-I70.0).  She was administered an IV fluid bolus, IV fentanyl and IV Zofran.  Her potassium was ordered to be supplemented IV.  Magnesium was ordered and her hypokalemia, pending at time of admission.  I did speak with on-call gastroenterology, [facility], Tye Savoy who will see the patient in consultation.  Internal medicine teaching service was consulted for admission.  Final Clinical Impression(s) / ED Diagnoses Final diagnoses:  Obstructive jaundice  Gallstones  Serum total bilirubin elevated  Elevated LFTs  Hypokalemia    Rx / DC Orders ED Discharge Orders     None         Regan Lemming, MD 05/21/22 [phone]

## 2022-05-21 NOTE — ED Triage Notes (Signed)
Pt reports RUQ pain. Pt stating she wants her results from last night. Reports her liver enzymes were elevated.

## 2022-05-21 NOTE — ED Notes (Signed)
Pt stated her stool has looked "Orange" the past week.

## 2022-05-21 NOTE — Consult Note (Signed)
Consultation Note   Referring Provider:  [facility] PCP: Cipriano Mile, NP Primary Gastroenterologist: . Wilfrid Lund, MD Reason for consultation: jaundice, abdominal pain   Hospital Day: 1  Assessment    # 62 yo female with RUQ pain, markedly abnormal liver chemistries, and imaging showing cholelithiasis and CBD dilation and concern for obstructing lesion in head of pancreas.   # Hypokalemia  # History of adenomatous colon polyps in 2018. Followed by Dr. Loletha Carrow. No polyps on surveillance colonoscopy last month  # See PMH for additional medical problems  Plan   Potassium repletion per admitting team. Getting K+ now Will hold off on antibiotics for now.  MRCP already ordered.    HPI   Michelle Williams is a 62 yo female with a pmh of HTN, RA, colon polyps and anxiety. She presented to ED today with upper abdominal pain. Workup notable for hypokalemia and markedly elevated liver chemistries in mixed pattern with bilirubin of 14. White count normal. Lipase normal. US shows CBD dilation and cholelithiasis. CT scan showing CBD dilation and concern for obstructing lesion in head of pancreas.   Michelle Williams has been having RUQ pain for maybe two weeks but recently got much worse. The pain does not radiate through to her back. The pain is intermittent. It is not related to eating but does hurts in certain positions. No fevers or chills at home. She had orange diarrhea about two weeks ago but that has resolved. Her weight has been stable.    Recent Labs and Imaging CT ABDOMEN PELVIS W CONTRAST  Result Date: 05/21/2022 CLINICAL DATA:  Right upper quadrant pain EXAM: CT ABDOMEN AND PELVIS WITH CONTRAST TECHNIQUE: Multidetector CT imaging of the abdomen and pelvis was performed using the standard protocol following bolus administration of intravenous contrast. RADIATION DOSE REDUCTION: This exam was performed according to the departmental dose-optimization  program which includes automated exposure control, adjustment of the mA and/or kV according to patient size and/or use of iterative reconstruction technique. CONTRAST:  6m OMNIPAQUE IOHEXOL 350 MG/ML SOLN COMPARISON:  Abdominal sonogram 05/20/2022 and CT AP 08/01/2012 FINDINGS: Lower chest: Atelectasis and ground-glass attenuation noted within the posterior lung bases. No pleural fluid or airspace disease. Subpleural nodule in the anterior right upper lobe measures 2 mm Hepatobiliary: No focal liver lesion identified. Stones noted on recent gallbladder ultrasound are not CT visible. There is no gallbladder wall thickening or pericholecystic fluid identified. Dilatation of the proximal common bile duct measures up to 1.3 cm. Abrupt termination the dilated common bile duct at the level of the head of pancreas, image 42/7. Pancreas: Unremarkable. No pancreatic ductal dilatation or surrounding inflammatory changes. Spleen: Normal in size without focal abnormality. Adrenals/Urinary Tract: Adrenal glands are unremarkable. Kidneys are normal, without renal calculi, focal lesion, or hydronephrosis. Bladder is unremarkable. Stomach/Bowel: Stomach is normal. The appendix is not confidently identified. No bowel wall thickening, inflammation, or distension. Vascular/Lymphatic: Aortic atherosclerosis. No aneurysm. No signs abdominopelvic adenopathy. Reproductive: Uterus and bilateral adnexa are unremarkable. Other: No ascites or focal fluid collections. No signs of pneumoperitoneum. Musculoskeletal: No acute or significant osseous findings. IMPRESSION: 1. Dilatation of the proximal common bile duct measures up to 1.3 cm. Abrupt termination the dilated common bile duct at the level of the  head of pancreas. There is also main duct dilatation which abruptly terminates at the level of the head of pancreas, proximal to the ampulla. Imaging findings are concerning for underlying obstructing lesion within the head of pancreas.  Further evaluation with contrast enhanced MRI/MRCP is advised. 2. Stones noted on recent gallbladder ultrasound are not CT visible. There is no gallbladder wall thickening or pericholecystic fluid identified. 3. 2 mm right upper lobe lung nodule. Nonspecific. See lung cancer screening CT from 04/15/2022 for follow-up recommendations. 4.  Aortic Atherosclerosis (ICD10-I70.0). Electronically Signed   By: Kerby Moors M.D.   On: 05/21/2022 13:50   US Abdomen Limited RUQ (LIVER/GB)  Result Date: 05/20/2022 CLINICAL DATA:  Pain. EXAM: ULTRASOUND ABDOMEN LIMITED RIGHT UPPER QUADRANT COMPARISON:  05/17/2022 FINDINGS: Gallbladder: Cholelithiasis with multiple stones filling the gallbladder. No gallbladder wall thickening or edema. Murphy's sign is negative. Common bile duct: Diameter: 13 mm, dilated and enlarging since previous study. Liver: No focal lesion identified. Within normal limits in parenchymal echogenicity. Portal vein is patent on color Doppler imaging with normal direction of blood flow towards the liver. Other: None. IMPRESSION: 1. Cholelithiasis without additional changes to suggest acute cholecystitis. 2. Dilated common bile duct at 13 mm, increasing since previous study. Consider MRCP to exclude common duct stone or obstruction. Electronically Signed   By: Lucienne Capers M.D.   On: 05/20/2022 21:58   US Abdomen Limited RUQ (LIVER/GB)  Result Date: 05/17/2022 CLINICAL DATA:  Jaundice EXAM: ULTRASOUND ABDOMEN LIMITED RIGHT UPPER QUADRANT COMPARISON:  Chest CT 04/18/2022 FINDINGS: Gallbladder: Multiple stones in the gallbladder lumen. No gallbladder wall thickening or pericholecystic. Negative sonographic Murphy's sign. Common bile duct: Diameter: Focally prominent at 7.6 mm. Liver: Possible small focal calcification right hepatic lobe. Normal hepatic parenchymal echogenicity. Portal vein is patent on color Doppler imaging with normal direction of blood flow towards the liver. Other: None.  IMPRESSION: 1. Cholelithiasis without secondary signs of acute cholecystitis. 2. Common bile duct is mildly prominent at 7.6 mm. Recommend correlation with LFTs. In the setting of abnormal LFTs, recommend correlation with MRI/MRCP. Electronically Signed   By: Lovey Newcomer M.D.   On: 05/17/2022 11:50    Labs:  Recent Labs    05/20/22 2050 05/21/22 1224  WBC 9.6 9.7  HGB 12.5 12.3  HCT 34.7* 34.6*  PLT 299 305   Recent Labs    05/20/22 2050 05/21/22 1224  NA 133* 135  K 2.9* 2.9*  CL 97* 100  CO2 26 25  GLUCOSE 94 138*  BUN <5* 5*  CREATININE 0.67 0.60  CALCIUM 9.2 8.8*   Recent Labs    05/21/22 1224  PROT 6.9  ALBUMIN 2.9*  AST 273*  ALT 428*  ALKPHOS 430*  BILITOT 14.4*  BILIDIR 9.1*  IBILI 5.3*   No results for input(s): "HEPBSAG", "HCVAB", "HEPAIGM", "HEPBIGM" in the last 72 hours. Recent Labs    05/20/22 2050  LABPROT 14.0  INR 1.1    Past Medical History:  Diagnosis Date   Acute bronchitis    Allergy    Anemia    Anxiety    Arthritis    Cataract    BEGINIING   Cervical cancer ([place])    Degenerative joint disease    UPPER,LOWER BACK   Gallstones    H/O degenerative disc disease    H/O rheumatoid arthritis    Hypertension    Neuropathy    BILATERAL    Past Surgical History:  Procedure Laterality Date   BREAST EXCISIONAL BIOPSY Right  COLONOSCOPY     2018   IR FALLOPIAN TUBE CATHETERIZATION     POLYPECTOMY      Family History  Problem Relation Age of Onset   Hypertension Mother    Stroke Mother    Early death Father    Colon cancer Brother        unsure age when diagnosed    HIV/AIDS Brother    Diabetes Maternal Grandmother    Colon polyps Neg Hx    Esophageal cancer Neg Hx    Rectal cancer Neg Hx    Stomach cancer Neg Hx    Crohn's disease Neg Hx    Ulcerative colitis Neg Hx     Prior to Admission medications   Medication Sig Start Date End Date Taking? Authorizing Provider  albuterol (VENTOLIN HFA) 108 (90 Base) MCG/ACT  inhaler Inhale into the lungs. 02/25/22  Yes [provider]  cyclobenzaprine (FLEXERIL) 10 MG tablet Take 10 mg by mouth at bedtime. 12/12/16  Yes [provider]  fluticasone (FLONASE) 50 MCG/ACT nasal spray Place 1 spray into both nostrils daily. Patient taking differently: Place 1 spray into both nostrils daily as needed for allergies. 02/19/22  Yes Nanavati, Ankit, MD  levocetirizine (XYZAL) 5 MG tablet Take 5 mg by mouth at bedtime.   Yes [provider]  lisinopril (ZESTRIL) 10 MG tablet Take 1 tablet (10 mg total) by mouth daily. 05/06/22  Yes Jettie Booze, MD  montelukast (SINGULAIR) 10 MG tablet Take 10 mg by mouth at bedtime.   Yes [provider]  traZODone (DESYREL) 100 MG tablet Take 100 mg by mouth at bedtime. 03/28/22  Yes [provider]  Varenicline Tartrate, Starter, (CHANTIX STARTING MONTH PAK) 0.5 MG X 11 & 1 MG X 42 TBPK Days 1 to 3: 0.5 mg once daily. Days 4 to 7: 0.5 mg twice daily. Maintenance (day 8 and later): 1 mg twice daily Patient taking differently: Take 0.5 mg by mouth 2 (two) times daily. 05/06/22  Yes Jettie Booze, MD    No current facility-administered medications for this encounter.   Current Outpatient Medications  Medication Sig Dispense Refill   albuterol (VENTOLIN HFA) 108 (90 Base) MCG/ACT inhaler Inhale into the lungs.     cyclobenzaprine (FLEXERIL) 10 MG tablet Take 10 mg by mouth at bedtime.  0   fluticasone (FLONASE) 50 MCG/ACT nasal spray Place 1 spray into both nostrils daily. (Patient taking differently: Place 1 spray into both nostrils daily as needed for allergies.) 9.9 mL 2   levocetirizine (XYZAL) 5 MG tablet Take 5 mg by mouth at bedtime.     lisinopril (ZESTRIL) 10 MG tablet Take 1 tablet (10 mg total) by mouth daily. 30 tablet 6   montelukast (SINGULAIR) 10 MG tablet Take 10 mg by mouth at bedtime.     traZODone (DESYREL) 100 MG tablet Take 100 mg by mouth at bedtime.     Varenicline  Tartrate, Starter, (CHANTIX STARTING MONTH PAK) 0.5 MG X 11 & 1 MG X 42 TBPK Days 1 to 3: 0.5 mg once daily. Days 4 to 7: 0.5 mg twice daily. Maintenance (day 8 and later): 1 mg twice daily (Patient taking differently: Take 0.5 mg by mouth 2 (two) times daily.) 1 each 0    Allergies as of 05/21/2022   (No Known Allergies)    Social History   Socioeconomic History   Marital status: Single    Spouse name: Not on file   Number of children: Not on  file   Years of education: Not on file   Highest education level: Not on file  Occupational History   Not on file  Tobacco Use   Smoking status: Every Day    Packs/day: 0.50    Types: Cigarettes    Passive exposure: Never   Smokeless tobacco: Never  Vaping Use   Vaping Use: Never used  Substance and Sexual Activity   Alcohol use: Not Currently    Comment: wine occ   Drug use: No   Sexual activity: Yes    Birth control/protection: None  Other Topics Concern   Not on file  Social History Narrative   Not on file   Social Determinants of Health   Financial Resource Strain: Not on file  Food Insecurity: Not on file  Transportation Needs: Not on file  Physical Activity: Not on file  Stress: Not on file  Social Connections: Not on file  Intimate Partner Violence: Not on file    Review of Systems: All systems reviewed and negative except where noted in HPI.  Physical Exam: Vital signs in last 24 hours: Temp:  [98.2 F (36.8 C)-98.6 F (37 C)] 98.2 F (36.8 C) (01/20 0851) Pulse Rate:  [66-78] 77 (01/20 1417) Resp:  [12-22] 12 (01/20 1417) BP: (139-172)/(89-98) 146/92 (01/20 1417) SpO2:  [96 %-99 %] 97 % (01/20 1417)    General:  Alert female in NAD Psych:  Pleasant, cooperative. Normal mood and affect Eyes: Pupils equal Ears:  Normal auditory acuity Nose: No deformity, discharge or lesions Neck:  Supple, no masses felt Lungs:  Clear to auscultation.  Heart:  Regular rate, regular rhythm.  Abdomen:  Soft,  nondistended, mild RUQ tenderness. Bowel sounds bowel sounds, no masses felt Rectal :  Deferred Msk: Symmetrical without gross deformities.  Neurologic:  Alert, oriented, grossly normal neurologically Extremities : No edema Skin:  Intact without significant lesions. Deeply jaundiced   Intake/Output from previous day: No intake/output data recorded. Intake/Output this shift:  No intake/output data recorded.    Active Problems:   * No active hospital problems. Tye Savoy, NP-C @  05/21/2022, 2:41 PM

## 2022-05-21 NOTE — H&P (Addendum)
Date: 05/21/2022               Patient Name:  Michelle Williams MRN: [id number]  DOB: Dec 04, 1960 Age / Sex: 62 y.o., female   PCP: Cipriano Mile, NP         Medical Service: Internal Medicine Teaching Service         Attending Physician: Dr. Aldine Contes, MD      First Contact: Dr. [name], DO Pager [phone]    Second Contact: Dr. Farrel Gordon, DO Pager [phone]         After Hours (After 5p/  First Contact Pager: [phone]  weekends / holidays): Second Contact Pager: [phone]   SUBJECTIVE   Chief Complaint: Jaundice  History of Present Illness:  Michelle Williams is a 62 y/o female with PMH of hypertension, COPD, cervical cancer s/p cryotherapy, anxiety, and possible iron deficiency anemia who presented with 4 weeks of progressive right upper quadrant pain, jaundice, diarrhea, and fatigue.  She states about 4 weeks ago she started having diarrhea that was orange in color.  She had about 4 bowel movements a day and this is continued although it has not been green for the past 2 weeks.  She also has had orange urine that has persisted.  About 2 weeks ago she noticed that she was turning yellow which is worsening since then with associated itching of her palms.  She has also had nonradiating right upper quadrant pain which is just under her ribs without any change with eating, bowel movements, or timing of day.  The pain does get a little worse when she has to lift something heavy or has to reach overhead.  She has not tried any medication for the pain.  She was seen by her PCP for this who sent her for a RUQ ultrasound on Tuesday and was told Friday (yesterday) to come straight to the hospital.  Over the past week she has had some new indigestion pain that has caused some mild nausea.  Otherwise she has not had any nausea and has not vomited.  She has also had increased frequency of stable mild headaches that are similar to her previous headaches associated with sinus infections or high blood  pressure.  No current headache.  New medications include lisinopril and Chantix. Patient denies any fevers, decreased appetite, sick contacts, travel, recent illnesses, new foods, or other changes in her life.  1 instance of dysuria at the end of her urination today but otherwise denied any dysuria.  No prior episodes of symptoms like these.  She denies any past problems with her gallbladder, liver, or pancreas.  She has noted a hernia like sensation in the same location of her pain now which has been evaluated by her primary care provider with physical exam several times.  Last colonoscopy was in 04/2022 with no abnormalities.  Of note patient was recently established with cardiology due to abnormal EKG which showed LVH and inverted T waves in the inferior lateral leads.  She was started on lisinopril in addition to continuing triamterene/hydrochlorothiazide and diltiazem to help prevent further remodeling.  She was planning to get echocardiogram but had not yet gotten this.  No worsening of symptoms that prior to the cardiology office including shortness of breath or lower extremity edema.  She has had some orthostatic symptoms but denies any syncope.  ED Course: Patient presented to the ED yesterday after PCP told her to come in appears to have just had some lab work done  before going home.  CMP showed AST of 293, ALT of 460, alk phos of 435, total bilirubin of 14.9, and direct bilirubin of 9.3.  Smear showed target cells. Coags, platelets, WBCs, Hgb, lipase, and renal function within normal limits.  RUQ US showed cholelithiasis without cholecystitis and CBD dilation of 13 up from 7.6 three days prior.  She was called and told to return to the emergency department where she had repeat CMP/CBC done today which were essentially stable.  CT of the abdomen and pelvis showed dilation of the proximal common bile duct up to 1.3 cm with abrupt termination of the dilation at the level of the head of the pancreas  with additional main duct dilation with similar abrupt termination, overall concerning for obstructing pancreatic head lesion.  Meds:  Albuterol Cyclobenzaprine Diltiazem Trazodone Triamterene/hydrochlorothiazide Lisinopril Varenicline Gabapentin Montelukast -Patient did not bring her medications with her and is not entirely sure if she is taking some of these  Past Medical History Hypertension Emphysema Cervical cancer treated with cryotherapy, last Pap 5 years ago without recurrence Anxiety Possible iron deficiency anemia   Past Surgical History:  Procedure Laterality Date   BREAST EXCISIONAL BIOPSY Right    COLONOSCOPY     2018   IR FALLOPIAN TUBE CATHETERIZATION     POLYPECTOMY      Social:  Lives alone with her cat Support: Family/friends in the area Level of Function: Independent in IADLs and ADLs PCP: Cipriano Mile, [place] Substances: Prior 0.5-1 PPD smoker with 30-pack-year history, currently on Chantix and smoking 1 cigarette every other day.  No current or prior heavy alcohol use.  No IV drug use currently or in the past.  Family History:  Mother had diabetes and a CVA Older brother has colon cancer  Allergies: Allergies as of 05/21/2022   (No Known Allergies)    Review of Systems: A complete ROS was negative except as per HPI.   OBJECTIVE:   Physical Exam: Blood pressure (!) 154/96, pulse 70, temperature 98.4 F (36.9 C), temperature source Oral, resp. rate 12, SpO2 99 %.  Constitutional: Obviously jaundiced but well-appearing female. In no acute distress. HENT: Normocephalic, atraumatic,  Eyes: Prominent scleral icterus, PERRL, EOM intact Cardio:Regular rate and rhythm. No murmurs, rubs, or gallops. 2+ bilateral radial pulses. Pulm:Clear to auscultation bilaterally. Normal work of breathing on room air. Abdomen: Soft, non-distended, positive bowel sounds.  Mild to moderate tenderness to palpation in the right upper quadrant, negative  Murphy sign, no rebound tenderness MSK: Trace lower extremity edema Skin: Jaundiced.  No telangiectasias noted.  No palmar erythema.  Warm and dry. Neuro:Alert and oriented x3. No focal deficit noted. Psych:Pleasant mood and affect.  Labs: CBC    Component Value Date/Time   WBC 9.7 05/21/2022 1224   RBC 3.89 05/21/2022 1224   HGB 12.3 05/21/2022 1224   HGB 13.5 03/13/2014 1140   HCT 34.6 (L) 05/21/2022 1224   HCT 40.4 03/13/2014 1140   PLT 305 05/21/2022 1224   PLT 253 03/13/2014 1140   MCV 88.9 05/21/2022 1224   MCV 96 03/13/2014 1140   MCH 31.6 05/21/2022 1224   MCHC 35.5 05/21/2022 1224   RDW 17.9 (H) 05/21/2022 1224   RDW 13.3 03/13/2014 1140   LYMPHSABS 1.8 05/21/2022 1224   MONOABS 0.8 05/21/2022 1224   EOSABS 0.1 05/21/2022 1224   BASOSABS 0.0 05/21/2022 1224     CMP     Component Value Date/Time   NA 135 05/21/2022 1224   NA  139 03/13/2014 1140   K 2.9 (L) 05/21/2022 1224   K 4.2 03/13/2014 1140   CL 100 05/21/2022 1224   CL 109 (H) 03/13/2014 1140   CO2 25 05/21/2022 1224   CO2 22 03/13/2014 1140   GLUCOSE 138 (H) 05/21/2022 1224   GLUCOSE 122 (H) 03/13/2014 1140   BUN 5 (L) 05/21/2022 1224   BUN 7 03/13/2014 1140   CREATININE 0.60 05/21/2022 1224   CREATININE 0.82 03/13/2014 1140   CALCIUM 8.8 (L) 05/21/2022 1224   CALCIUM 8.5 03/13/2014 1140   PROT 6.9 05/21/2022 1224   PROT 6.9 05/21/2018 1056   ALBUMIN 2.9 (L) 05/21/2022 1224   ALBUMIN 4.2 05/21/2018 1056   AST 273 (H) 05/21/2022 1224   ALT 428 (H) 05/21/2022 1224   ALKPHOS 430 (H) 05/21/2022 1224   BILITOT 14.4 (H) 05/21/2022 1224   BILITOT <0.2 05/21/2018 1056   GFRNONAA >60 05/21/2022 1224   GFRNONAA >60 03/13/2014 1140   GFRAA >60 11/16/2014 1830   GFRAA >60 03/13/2014 1140    Imaging: RUQ ultrasound IMPRESSION: 1. Cholelithiasis without additional changes to suggest acute cholecystitis. 2. Dilated common bile duct at 13 mm, increasing since previous study. Consider MRCP to  exclude common duct stone or obstruction.  CT abdomen pelvis with contrast IMPRESSION: 1. Dilatation of the proximal common bile duct measures up to 1.3 cm. Abrupt termination the dilated common bile duct at the level of the head of pancreas. There is also main duct dilatation which abruptly terminates at the level of the head of pancreas, proximal to the ampulla. Imaging findings are concerning for underlying obstructing lesion within the head of pancreas. Further evaluation with contrast enhanced MRI/MRCP is advised. 2. Stones noted on recent gallbladder ultrasound are not CT visible. There is no gallbladder wall thickening or pericholecystic fluid identified. 3. 2 mm right upper lobe lung nodule. Nonspecific. See lung cancer screening CT from 04/15/2022 for follow-up recommendations. 4.  Aortic Atherosclerosis (ICD10-I70.0).   EKG: Personally reviewed-normal sinus rhythm, LVH and T wave inversions in the inferior lateral leads.  Consistent with EKG from 01/2022 shows LVH and T wave inversions in the inferior lateral leads  ASSESSMENT & PLAN:   Assessment & Plan by Problem: Principal Problem:   Obstructive jaundice Active Problems:   Elevated LFTs   Abnormal CT scan, gastrointestinal tract   Michelle Williams is a 62 y.o. person living with a history of hypertension, COPD, cervical cancer s/p cryotherapy, anxiety, and possible iron deficiency anemia who presented with 4 weeks of progressive right upper quadrant pain, jaundice, diarrhea, and fatigue and admitted for obstructive jaundice with evidence of a lesion near the head of the pancreas on hospital day 0  Obstructive jaundice 2/2 a lesion near the head of the pancreas Conjugated hyperbilirubinemia Elevated LFTs/alk phos Presented with jaundice, right upper quadrant pain, diarrhea for 4 weeks without infectious signs.  Bilirubin significantly elevated at 14 with direct bilirubin of 9.  AST/ALT 293/460, alk phos 435. Ultrasound  and CT imaging showing dilation of the extrahepatic biliary system with a lesion near the head of the pancreas.  No evidence of cholecystitis, pancreatitis, and low suspicion for viral hepatitis.  Concern for pancreatic cancer as well as cholangiocarcinoma.  Low likelihood of choledocholithiasis, ascending cholangitis, cholecystitis, viral hepatitis, or gastroenteritis.  No signs of acute liver failure or cirrhosis.  GI was consulted and recommend proceeding with MRCP with need for ERCP during admission. - GI consulted, appreciate their recommendations - MRCP - Hold antibiotics for  now, start if she develops fever or leukocytosis - Holding pain medication but can add if pain worsens or becomes constant  Hypokalemia Abnormal EKG Presented with potassium of 2.9 which is stable on return to the ED today, likely from GI losses.  EKG here without significant changes from 01/2022 but there is persistent LVH and T wave inversions in the inferior lateral leads.  She saw cardiology for the first time earlier this month who will plan to get an echocardiogram to further evaluate.  Pending further workup of her jaundice we may be able to get that done while she is here. - IV potassium - AM BMP  Hypertension Patient is a history of hypertension had previously been on triamterene/hydrochlorothiazide and diltiazem.  She was seen by cardiology at this month and was started on lisinopril.  During her interview she only remembered taking lisinopril. - Continue lisinopril 10 mg daily - Hold triamterene/hydrochlorothiazide and diltiazem until reliable med rec has been completed  COPD Tobacco use disorder Pulmonary nodule Patient is prescribed Singulair and albuterol at home.  She is also quitting smoking and is on Chantix.  No signs of worsening respiratory status or lung infection. CT abdomen pelvis showed 2 mm right upper lobe lung nodule, consistent with 2.6 mm anterior right middle lobe nodule seen on lung  cancer screening CT with Lung-RADS 2.  Recommend repeat screening with low-dose CT chest without contrast 12 months from then. - Continue Singulair 10 mg daily - Albuterol nebulizer as needed - Continue Chantix 0.5 mg twice daily  Diet: NPO VTE: Enoxaparin IVF: None, Code: DNR  Prior to Admission Living Arrangement: Home, living alone Anticipated Discharge Location: Home  Dispo: Admit patient to Inpatient with expected length of stay greater than 2 midnights.  Signed: [name], DO Internal Medicine Resident PGY-1  05/21/2022, 5:44 PM   Dr. [name], DO Pager [phone]

## 2022-05-22 DIAGNOSIS — K831 Obstruction of bile duct: Secondary | ICD-10-CM

## 2022-05-22 DIAGNOSIS — R933 Abnormal findings on diagnostic imaging of other parts of digestive tract: Secondary | ICD-10-CM | POA: Diagnosis not present

## 2022-05-22 LAB — COMPREHENSIVE METABOLIC PANEL
ALT: 382 U/L — ABNORMAL HIGH (ref 0–44)
AST: 272 U/L — ABNORMAL HIGH (ref 15–41)
Albumin: 2.8 g/dL — ABNORMAL LOW (ref 3.5–5.0)
Alkaline Phosphatase: 389 U/L — ABNORMAL HIGH (ref 38–126)
Anion gap: 10 (ref 5–15)
BUN: <5 mg/dL — ABNORMAL LOW (ref 6–20)
CO2: 23 mmol/L (ref 22–32)
Calcium: 8.9 mg/dL (ref 8.9–10.3)
Chloride: 101 mmol/L (ref 98–111)
Creatinine, Ser: 0.58 mg/dL (ref 0.44–1.00)
GFR, Estimated: >60 mL/min (ref >60)
Glucose, Bld: 101 mg/dL — ABNORMAL HIGH (ref 70–99)
Potassium: 3.1 mmol/L — ABNORMAL LOW (ref 3.5–5.1)
Sodium: 134 mmol/L — ABNORMAL LOW (ref 135–145)
Total Bilirubin: 13.6 mg/dL — ABNORMAL HIGH (ref 0.3–1.2)
Total Protein: 6.6 g/dL (ref 6.5–8.1)

## 2022-05-22 LAB — CBC
HCT: 32.9 % — ABNORMAL LOW (ref 36.0–46.0)
Hemoglobin: 12.3 g/dL (ref 12.0–15.0)
MCH: 32.5 pg (ref 26.0–34.0)
MCHC: 37.4 g/dL — ABNORMAL HIGH (ref 30.0–36.0)
MCV: 86.8 fL (ref 80.0–100.0)
Platelets: 278 10*3/uL (ref 150–400)
RBC: 3.79 MIL/uL — ABNORMAL LOW (ref 3.87–5.11)
RDW: 18.1 % — ABNORMAL HIGH (ref 11.5–15.5)
WBC: 10.1 10*3/uL (ref 4.0–10.5)
nRBC: 0 % (ref 0.0–0.2)

## 2022-05-22 MED ORDER — POTASSIUM CHLORIDE 10 MEQ/100ML IV SOLN
10.0000 meq | INTRAVENOUS | Status: AC
  Administered 2022-05-22 (×4): 10 meq via INTRAVENOUS
  Filled 2022-05-22 (×4): qty 100

## 2022-05-22 MED ORDER — MAGNESIUM SULFATE 2 GM/50ML IV SOLN
2.0000 g | Freq: Once | INTRAVENOUS | Status: AC
  Administered 2022-05-22: 2 g via INTRAVENOUS
  Filled 2022-05-22: qty 50

## 2022-05-22 NOTE — H&P (View-Only) (Signed)
Daily Progress Note  Hospital Day: 2  Chief Complaint:  jaundice, abnormal pancreas on imaging  Brief Narrative:  Michelle Williams is a 62 y.o. female with a pmh of HTN, RA, colon polyps, COPD, and anxiety. She was admitted 1/20 with abdominal pain and jaundice.   Assessment    #62 yo female with RUQ pain, markedly abnormal liver chemistries, and imaging showing cholelithiasis but also severe biliary duct dilation and a head of pancreas lesion.     # Hypokalemia, K+ 3.1. Treated with  additional K+ this am   # History of adenomatous colon polyps in 2018. Followed by Dr. Loletha Carrow. No polyps on surveillance colonoscopy last month  Plan  ERCP tomorrow. The benefits and risks of ERCP with possible sphincterotomy and biopsies not limited to cardiopulmonary complications of sedation, bleeding, infection, perforation,and pancreatitis were discussed with the patient who agrees to proceed.   Will let primary team know that I plan to hold tonight's lovenox in case they want to place SCDs  Subjective   She was happy to get lunch. No significant abdominal pain   Objective   Imaging:  MR ABDOMEN MRCP WO CONTRAST  Result Date: 05/22/2022 CLINICAL DATA:  Jaundice. EXAM: MRI ABDOMEN WITHOUT CONTRAST  (INCLUDING MRCP) TECHNIQUE: Multiplanar multisequence MR imaging of the abdomen was performed. Heavily T2-weighted images of the biliary and pancreatic ducts were obtained, and three-dimensional MRCP images were rendered by post processing. COMPARISON:  CT 05/21/2022 FINDINGS: Note: Exam detail is diminished. Patient aborted exam prior to completion of study. No composed contrast imaging obtained. Additionally, there is motion artifact which further diminishes diagnostic assessment of the area of concern within the head of pancreas. Lower chest: No pleural fluid identified. Atelectasis noted within both lung bases. Hepatobiliary: Within the limitations of motion artifact there is no focal liver  abnormality. No significant gallbladder wall thickening. As noted on recent CT there is marked intrahepatic and common bile duct dilatation. The common bile duct measures 1.4 cm in maximum diameter, image 15/5. Abrupt termination of the common bile duct within the head of pancreas is noted. Pancreas: No pancreatic inflammation. There is diffuse main duct dilatation up to the level of the head of pancreas which measures 6 mm in maximum diameter, image 25/3. The dilated pancreatic duct terminates at a similar level as the dilated common bile duct. Here, there is a focal area of slight increased T2 signal and decreased T1 signal within the head of pancreas measuring approximately 1.9 x 0.7 cm, image 25/3 and image number 64/7. Imaging findings are highly suspicious for occult neoplasm within the head of pancreas, image 15/5. Spleen:  Within normal limits in size and appearance. Adrenals/Urinary Tract: Normal adrenal glands. No kidney mass or hydronephrosis. Stomach/Bowel: Visualized portions within the abdomen are unremarkable. Vascular/Lymphatic: Normal caliber of the abdominal aorta. No upper abdominal adenopathy identified. Other:  No ascites. Musculoskeletal: No suspicious bone lesions identified. IMPRESSION: 1. Exam detail is diminished. Patient aborted exam prior to completion of study and prior to administration of intravenous contrast material. Additionally, there is motion artifact which further diminishes diagnostic assessment of the area of concern within the head of pancreas. 2. Marked intrahepatic and common bile duct dilatation. Abrupt termination of the common bile duct within the head of pancreas is noted. There is diffuse main duct dilatation up to the level of the head of pancreas which measures 6 mm in maximum diameter. The dilated main duct terminates at the same level as the termination of  the CBD. Here, there is a subtle signal abnormality measuring approximately 1.9 x 0.7 cm. Imaging findings are  concerning for underlying neoplasm within the head of pancreas. Advise further evaluation with ERCP and endoscopic ultrasound. Electronically Signed   By: Kerby Moors M.D.   On: 05/22/2022 04:31   CT ABDOMEN PELVIS W CONTRAST  Result Date: 05/21/2022 CLINICAL DATA:  Right upper quadrant pain EXAM: CT ABDOMEN AND PELVIS WITH CONTRAST TECHNIQUE: Multidetector CT imaging of the abdomen and pelvis was performed using the standard protocol following bolus administration of intravenous contrast. RADIATION DOSE REDUCTION: This exam was performed according to the departmental dose-optimization program which includes automated exposure control, adjustment of the mA and/or kV according to patient size and/or use of iterative reconstruction technique. CONTRAST:  49m OMNIPAQUE IOHEXOL 350 MG/ML SOLN COMPARISON:  Abdominal sonogram 05/20/2022 and CT AP 08/01/2012 FINDINGS: Lower chest: Atelectasis and ground-glass attenuation noted within the posterior lung bases. No pleural fluid or airspace disease. Subpleural nodule in the anterior right upper lobe measures 2 mm Hepatobiliary: No focal liver lesion identified. Stones noted on recent gallbladder ultrasound are not CT visible. There is no gallbladder wall thickening or pericholecystic fluid identified. Dilatation of the proximal common bile duct measures up to 1.3 cm. Abrupt termination the dilated common bile duct at the level of the head of pancreas, image 42/7. Pancreas: Unremarkable. No pancreatic ductal dilatation or surrounding inflammatory changes. Spleen: Normal in size without focal abnormality. Adrenals/Urinary Tract: Adrenal glands are unremarkable. Kidneys are normal, without renal calculi, focal lesion, or hydronephrosis. Bladder is unremarkable. Stomach/Bowel: Stomach is normal. The appendix is not confidently identified. No bowel wall thickening, inflammation, or distension. Vascular/Lymphatic: Aortic atherosclerosis. No aneurysm. No signs abdominopelvic  adenopathy. Reproductive: Uterus and bilateral adnexa are unremarkable. Other: No ascites or focal fluid collections. No signs of pneumoperitoneum. Musculoskeletal: No acute or significant osseous findings. IMPRESSION: 1. Dilatation of the proximal common bile duct measures up to 1.3 cm. Abrupt termination the dilated common bile duct at the level of the head of pancreas. There is also main duct dilatation which abruptly terminates at the level of the head of pancreas, proximal to the ampulla. Imaging findings are concerning for underlying obstructing lesion within the head of pancreas. Further evaluation with contrast enhanced MRI/MRCP is advised. 2. Stones noted on recent gallbladder ultrasound are not CT visible. There is no gallbladder wall thickening or pericholecystic fluid identified. 3. 2 mm right upper lobe lung nodule. Nonspecific. See lung cancer screening CT from 04/15/2022 for follow-up recommendations. 4.  Aortic Atherosclerosis (ICD10-I70.0). Electronically Signed   By: TKerby MoorsM.D.   On: 05/21/2022 13:50   [place]domen Limited RUQ (LIVER/GB)  Result Date: 05/20/2022 CLINICAL DATA:  Pain. EXAM: ULTRASOUND ABDOMEN LIMITED RIGHT UPPER QUADRANT COMPARISON:  05/17/2022 FINDINGS: Gallbladder: Cholelithiasis with multiple stones filling the gallbladder. No gallbladder wall thickening or edema. Murphy's sign is negative. Common bile duct: Diameter: 13 mm, dilated and enlarging since previous study. Liver: No focal lesion identified. Within normal limits in parenchymal echogenicity. Portal vein is patent on color Doppler imaging with normal direction of blood flow towards the liver. Other: None. IMPRESSION: 1. Cholelithiasis without additional changes to suggest acute cholecystitis. 2. Dilated common bile duct at 13 mm, increasing since previous study. Consider MRCP to exclude common duct stone or obstruction. Electronically Signed   By: WLucienne CapersM.D.   On: 05/20/2022 21:58    Lab  Results: Recent Labs    05/20/22 2050 05/21/22 1224 05/22/22 0328  WBC  9.6 9.7 10.1  HGB 12.5 12.3 12.3  HCT 34.7* 34.6* 32.9*  PLT 299 305 278   BMET Recent Labs    05/20/22 2050 05/21/22 1224 05/22/22 0328  NA 133* 135 134*  K 2.9* 2.9* 3.1*  CL 97* 100 101  CO2 '26 25 23  '$ GLUCOSE 94 138* 101*  BUN <5* 5* <5*  CREATININE 0.67 0.60 0.58  CALCIUM 9.2 8.8* 8.9   LFT Recent Labs    05/21/22 1224 05/22/22 0328  PROT 6.9 6.6  ALBUMIN 2.9* 2.8*  AST 273* 272*  ALT 428* 382*  ALKPHOS 430* 389*  BILITOT 14.4* 13.6*  BILIDIR 9.1*  --   IBILI 5.3*  --    PT/INR Recent Labs    05/20/22 2050  LABPROT 14.0  INR 1.1     Scheduled inpatient medications:   enoxaparin (LOVENOX) injection  40 mg Subcutaneous Q24H   lisinopril  10 mg Oral Daily   montelukast  10 mg Oral QHS   varenicline  0.5 mg Oral BID   Continuous inpatient infusions:   magnesium sulfate bolus IVPB     potassium chloride 10 mEq (05/22/22 1319)   PRN inpatient medications: albuterol  Vital signs in last 24 hours: Temp:  [98.4 F (36.9 C)-99.8 F (37.7 C)] 99.4 F (37.4 C) (01/21 0809) Pulse Rate:  [66-77] 69 (01/21 0809) Resp:  [12-17] 17 (01/21 0809) BP: (133-165)/(86-99) 145/88 (01/21 0809) SpO2:  [95 %-99 %] 97 % (01/21 0809) Last BM Date : 05/21/22 No intake or output data in the 24 hours ending 05/22/22 1411  Intake/Output from previous day: No intake/output data recorded. Intake/Output this shift: No intake/output data recorded.   Physical Exam:  General: Alert female in NAD Heart:  Regular rate and rhythm.  Pulmonary: Normal respiratory effort Abdomen: Soft, nondistended, nontender. Normal bowel sounds. Extremities: No lower extremity edema  Neurologic: Alert and oriented Psych: Pleasant. Cooperative.    Principal Problem:   Obstructive jaundice Active Problems:   Elevated LFTs   Abnormal CT scan, gastrointestinal tract     LOS: 1 day   Tye Savoy ,NP  05/22/2022, 2:11 PM

## 2022-05-22 NOTE — Progress Notes (Signed)
Daily Progress Note  Hospital Day: 2  Chief Complaint:  jaundice, abnormal pancreas on imaging  Brief Narrative:  Michelle Williams is a 62 y.o. female with a pmh of HTN, RA, colon polyps, COPD, and anxiety. She was admitted 1/20 with abdominal pain and jaundice.   Assessment    #62 yo female with RUQ pain, markedly abnormal liver chemistries, and imaging showing cholelithiasis but also severe biliary duct dilation and a head of pancreas lesion.     # Hypokalemia, K+ 3.1. Treated with  additional K+ this am   # History of adenomatous colon polyps in 2018. Followed by Dr. Loletha Carrow. No polyps on surveillance colonoscopy last month  Plan  ERCP tomorrow. The benefits and risks of ERCP with possible sphincterotomy and biopsies not limited to cardiopulmonary complications of sedation, bleeding, infection, perforation,and pancreatitis were discussed with the patient who agrees to proceed.   Will let primary team know that I plan to hold tonight's lovenox in case they want to place SCDs  Subjective   She was happy to get lunch. No significant abdominal pain   Objective   Imaging:  MR ABDOMEN MRCP WO CONTRAST  Result Date: 05/22/2022 CLINICAL DATA:  Jaundice. EXAM: MRI ABDOMEN WITHOUT CONTRAST  (INCLUDING MRCP) TECHNIQUE: Multiplanar multisequence MR imaging of the abdomen was performed. Heavily T2-weighted images of the biliary and pancreatic ducts were obtained, and three-dimensional MRCP images were rendered by post processing. COMPARISON:  CT 05/21/2022 FINDINGS: Note: Exam detail is diminished. Patient aborted exam prior to completion of study. No composed contrast imaging obtained. Additionally, there is motion artifact which further diminishes diagnostic assessment of the area of concern within the head of pancreas. Lower chest: No pleural fluid identified. Atelectasis noted within both lung bases. Hepatobiliary: Within the limitations of motion artifact there is no focal liver  abnormality. No significant gallbladder wall thickening. As noted on recent CT there is marked intrahepatic and common bile duct dilatation. The common bile duct measures 1.4 cm in maximum diameter, image 15/5. Abrupt termination of the common bile duct within the head of pancreas is noted. Pancreas: No pancreatic inflammation. There is diffuse main duct dilatation up to the level of the head of pancreas which measures 6 mm in maximum diameter, image 25/3. The dilated pancreatic duct terminates at a similar level as the dilated common bile duct. Here, there is a focal area of slight increased T2 signal and decreased T1 signal within the head of pancreas measuring approximately 1.9 x 0.7 cm, image 25/3 and image number 64/7. Imaging findings are highly suspicious for occult neoplasm within the head of pancreas, image 15/5. Spleen:  Within normal limits in size and appearance. Adrenals/Urinary Tract: Normal adrenal glands. No kidney mass or hydronephrosis. Stomach/Bowel: Visualized portions within the abdomen are unremarkable. Vascular/Lymphatic: Normal caliber of the abdominal aorta. No upper abdominal adenopathy identified. Other:  No ascites. Musculoskeletal: No suspicious bone lesions identified. IMPRESSION: 1. Exam detail is diminished. Patient aborted exam prior to completion of study and prior to administration of intravenous contrast material. Additionally, there is motion artifact which further diminishes diagnostic assessment of the area of concern within the head of pancreas. 2. Marked intrahepatic and common bile duct dilatation. Abrupt termination of the common bile duct within the head of pancreas is noted. There is diffuse main duct dilatation up to the level of the head of pancreas which measures 6 mm in maximum diameter. The dilated main duct terminates at the same level as the termination of  the CBD. Here, there is a subtle signal abnormality measuring approximately 1.9 x 0.7 cm. Imaging findings are  concerning for underlying neoplasm within the head of pancreas. Advise further evaluation with ERCP and endoscopic ultrasound. Electronically Signed   By: Kerby Moors M.D.   On: 05/22/2022 04:31   CT ABDOMEN PELVIS W CONTRAST  Result Date: 05/21/2022 CLINICAL DATA:  Right upper quadrant pain EXAM: CT ABDOMEN AND PELVIS WITH CONTRAST TECHNIQUE: Multidetector CT imaging of the abdomen and pelvis was performed using the standard protocol following bolus administration of intravenous contrast. RADIATION DOSE REDUCTION: This exam was performed according to the departmental dose-optimization program which includes automated exposure control, adjustment of the mA and/or kV according to patient size and/or use of iterative reconstruction technique. CONTRAST:  81m OMNIPAQUE IOHEXOL 350 MG/ML SOLN COMPARISON:  Abdominal sonogram 05/20/2022 and CT AP 08/01/2012 FINDINGS: Lower chest: Atelectasis and ground-glass attenuation noted within the posterior lung bases. No pleural fluid or airspace disease. Subpleural nodule in the anterior right upper lobe measures 2 mm Hepatobiliary: No focal liver lesion identified. Stones noted on recent gallbladder ultrasound are not CT visible. There is no gallbladder wall thickening or pericholecystic fluid identified. Dilatation of the proximal common bile duct measures up to 1.3 cm. Abrupt termination the dilated common bile duct at the level of the head of pancreas, image 42/7. Pancreas: Unremarkable. No pancreatic ductal dilatation or surrounding inflammatory changes. Spleen: Normal in size without focal abnormality. Adrenals/Urinary Tract: Adrenal glands are unremarkable. Kidneys are normal, without renal calculi, focal lesion, or hydronephrosis. Bladder is unremarkable. Stomach/Bowel: Stomach is normal. The appendix is not confidently identified. No bowel wall thickening, inflammation, or distension. Vascular/Lymphatic: Aortic atherosclerosis. No aneurysm. No signs abdominopelvic  adenopathy. Reproductive: Uterus and bilateral adnexa are unremarkable. Other: No ascites or focal fluid collections. No signs of pneumoperitoneum. Musculoskeletal: No acute or significant osseous findings. IMPRESSION: 1. Dilatation of the proximal common bile duct measures up to 1.3 cm. Abrupt termination the dilated common bile duct at the level of the head of pancreas. There is also main duct dilatation which abruptly terminates at the level of the head of pancreas, proximal to the ampulla. Imaging findings are concerning for underlying obstructing lesion within the head of pancreas. Further evaluation with contrast enhanced MRI/MRCP is advised. 2. Stones noted on recent gallbladder ultrasound are not CT visible. There is no gallbladder wall thickening or pericholecystic fluid identified. 3. 2 mm right upper lobe lung nodule. Nonspecific. See lung cancer screening CT from 04/15/2022 for follow-up recommendations. 4.  Aortic Atherosclerosis (ICD10-I70.0). Electronically Signed   By: TKerby MoorsM.D.   On: 05/21/2022 13:50   [place]domen Limited RUQ (LIVER/GB)  Result Date: 05/20/2022 CLINICAL DATA:  Pain. EXAM: ULTRASOUND ABDOMEN LIMITED RIGHT UPPER QUADRANT COMPARISON:  05/17/2022 FINDINGS: Gallbladder: Cholelithiasis with multiple stones filling the gallbladder. No gallbladder wall thickening or edema. Murphy's sign is negative. Common bile duct: Diameter: 13 mm, dilated and enlarging since previous study. Liver: No focal lesion identified. Within normal limits in parenchymal echogenicity. Portal vein is patent on color Doppler imaging with normal direction of blood flow towards the liver. Other: None. IMPRESSION: 1. Cholelithiasis without additional changes to suggest acute cholecystitis. 2. Dilated common bile duct at 13 mm, increasing since previous study. Consider MRCP to exclude common duct stone or obstruction. Electronically Signed   By: WLucienne CapersM.D.   On: 05/20/2022 21:58    Lab  Results: Recent Labs    05/20/22 2050 05/21/22 1224 05/22/22 0328  WBC  9.6 9.7 10.1  HGB 12.5 12.3 12.3  HCT 34.7* 34.6* 32.9*  PLT 299 305 278   BMET Recent Labs    05/20/22 2050 05/21/22 1224 05/22/22 0328  NA 133* 135 134*  K 2.9* 2.9* 3.1*  CL 97* 100 101  CO2 '26 25 23  '$ GLUCOSE 94 138* 101*  BUN <5* 5* <5*  CREATININE 0.67 0.60 0.58  CALCIUM 9.2 8.8* 8.9   LFT Recent Labs    05/21/22 1224 05/22/22 0328  PROT 6.9 6.6  ALBUMIN 2.9* 2.8*  AST 273* 272*  ALT 428* 382*  ALKPHOS 430* 389*  BILITOT 14.4* 13.6*  BILIDIR 9.1*  --   IBILI 5.3*  --    PT/INR Recent Labs    05/20/22 2050  LABPROT 14.0  INR 1.1     Scheduled inpatient medications:   enoxaparin (LOVENOX) injection  40 mg Subcutaneous Q24H   lisinopril  10 mg Oral Daily   montelukast  10 mg Oral QHS   varenicline  0.5 mg Oral BID   Continuous inpatient infusions:   magnesium sulfate bolus IVPB     potassium chloride 10 mEq (05/22/22 1319)   PRN inpatient medications: albuterol  Vital signs in last 24 hours: Temp:  [98.4 F (36.9 C)-99.8 F (37.7 C)] 99.4 F (37.4 C) (01/21 0809) Pulse Rate:  [66-77] 69 (01/21 0809) Resp:  [12-17] 17 (01/21 0809) BP: (133-165)/(86-99) 145/88 (01/21 0809) SpO2:  [95 %-99 %] 97 % (01/21 0809) Last BM Date : 05/21/22 No intake or output data in the 24 hours ending 05/22/22 1411  Intake/Output from previous day: No intake/output data recorded. Intake/Output this shift: No intake/output data recorded.   Physical Exam:  General: Alert female in NAD Heart:  Regular rate and rhythm.  Pulmonary: Normal respiratory effort Abdomen: Soft, nondistended, nontender. Normal bowel sounds. Extremities: No lower extremity edema  Neurologic: Alert and oriented Psych: Pleasant. Cooperative.    Principal Problem:   Obstructive jaundice Active Problems:   Elevated LFTs   Abnormal CT scan, gastrointestinal tract     LOS: 1 day   Tye Savoy ,NP  05/22/2022, 2:11 PM

## 2022-05-22 NOTE — Progress Notes (Signed)
HD#1 Subjective:   Summary: Michelle Williams is a 62 y.o. person living with a history of hypertension, COPD, cervical cancer s/p cryotherapy, anxiety, and possible iron deficiency anemia who presented with 4 weeks of progressive right upper quadrant pain, jaundice, diarrhea, and fatigue and admitted for obstructive jaundice with evidence of a lesion near the head of the pancreas.  Overnight Events: None  Patient is doing well this morning without any new or worsening complaints.  She still has intermittent right abdominal pain but has not had any more diarrhea.  She is hungry and wants to eat.  Objective:  Vital signs in last 24 hours: Vitals:   05/21/22 2105 05/21/22 2359 05/22/22 0500 05/22/22 0809  BP: (!) 165/99 (!) 152/97 133/86 (!) 145/88  Pulse: 77 74 66 69  Resp:    17  Temp: 99.8 F (37.7 C) 99.8 F (37.7 C) 99.3 F (37.4 C) 99.4 F (37.4 C)  TempSrc: Oral Oral Oral Oral  SpO2: 95% 97% 97% 97%   Supplemental O2: Room Air SpO2: 97 %   Physical Exam:  Constitutional: Obviously jaundiced but well-appearing female. In no acute distress. Eyes: Prominent scleral icterus, PERRL, EOM intact, whitish ring around the exterior of her iris bilaterally Cardio:Regular rate and rhythm.  Pulm:Clear to auscultation bilaterally. Normal work of breathing on room air. Abdomen: Soft, non-distended, positive bowel sounds.  Mild to moderate tenderness to palpation in the right upper quadrant MSK: Trace lower extremity edema Skin: Jaundiced.  No telangiectasias noted.  No palmar erythema.  Warm and dry. Neuro:Alert and oriented x3. No focal deficit noted. Psych:Pleasant mood and affect.  There were no vitals filed for this visit.  No intake or output data in the 24 hours ending 05/22/22 1156 Net IO Since Admission: No IO data has been entered for this period [05/22/22 1156]  Pertinent Labs:    Latest Ref Rng & Units 05/22/2022    3:28 AM 05/21/2022   12:24 PM 05/20/2022    8:50  PM  CBC  WBC 4.0 - 10.5 K/uL 10.1  9.7  9.6   Hemoglobin 12.0 - 15.0 g/dL 12.3  12.3  12.5   Hematocrit 36.0 - 46.0 % 32.9  34.6  34.7   Platelets 150 - 400 K/uL 278  305  299        Latest Ref Rng & Units 05/22/2022    3:28 AM 05/21/2022   12:24 PM 05/20/2022    8:50 PM  CMP  Glucose 70 - 99 mg/dL 101  138  94   BUN 6 - 20 mg/dL <5  5  <5   Creatinine 0.44 - 1.00 mg/dL 0.58  0.60  0.67   Sodium 135 - 145 mmol/L 134  135  133   Potassium 3.5 - 5.1 mmol/L 3.1  2.9  2.9   Chloride 98 - 111 mmol/L 101  100  97   CO2 22 - 32 mmol/L '23  25  26   '$ Calcium 8.9 - 10.3 mg/dL 8.9  8.8  9.2   Total Protein 6.5 - 8.1 g/dL 6.6  6.9  7.3   Total Bilirubin 0.3 - 1.2 mg/dL 13.6  14.4  14.9   Alkaline Phos 38 - 126 U/L 389  430  435   AST 15 - 41 U/L 272  273  293   ALT 0 - 44 U/L 382  428  460     Imaging: MR ABDOMEN MRCP WO CONTRAST  Result Date: 05/22/2022 CLINICAL DATA:  Jaundice. EXAM: MRI ABDOMEN WITHOUT CONTRAST  (INCLUDING MRCP) TECHNIQUE: Multiplanar multisequence MR imaging of the abdomen was performed. Heavily T2-weighted images of the biliary and pancreatic ducts were obtained, and three-dimensional MRCP images were rendered by post processing. COMPARISON:  CT 05/21/2022 FINDINGS: Note: Exam detail is diminished. Patient aborted exam prior to completion of study. No composed contrast imaging obtained. Additionally, there is motion artifact which further diminishes diagnostic assessment of the area of concern within the head of pancreas. Lower chest: No pleural fluid identified. Atelectasis noted within both lung bases. Hepatobiliary: Within the limitations of motion artifact there is no focal liver abnormality. No significant gallbladder wall thickening. As noted on recent CT there is marked intrahepatic and common bile duct dilatation. The common bile duct measures 1.4 cm in maximum diameter, image 15/5. Abrupt termination of the common bile duct within the head of pancreas is noted.  Pancreas: No pancreatic inflammation. There is diffuse main duct dilatation up to the level of the head of pancreas which measures 6 mm in maximum diameter, image 25/3. The dilated pancreatic duct terminates at a similar level as the dilated common bile duct. Here, there is a focal area of slight increased T2 signal and decreased T1 signal within the head of pancreas measuring approximately 1.9 x 0.7 cm, image 25/3 and image number 64/7. Imaging findings are highly suspicious for occult neoplasm within the head of pancreas, image 15/5. Spleen:  Within normal limits in size and appearance. Adrenals/Urinary Tract: Normal adrenal glands. No kidney mass or hydronephrosis. Stomach/Bowel: Visualized portions within the abdomen are unremarkable. Vascular/Lymphatic: Normal caliber of the abdominal aorta. No upper abdominal adenopathy identified. Other:  No ascites. Musculoskeletal: No suspicious bone lesions identified. IMPRESSION: 1. Exam detail is diminished. Patient aborted exam prior to completion of study and prior to administration of intravenous contrast material. Additionally, there is motion artifact which further diminishes diagnostic assessment of the area of concern within the head of pancreas. 2. Marked intrahepatic and common bile duct dilatation. Abrupt termination of the common bile duct within the head of pancreas is noted. There is diffuse main duct dilatation up to the level of the head of pancreas which measures 6 mm in maximum diameter. The dilated main duct terminates at the same level as the termination of the CBD. Here, there is a subtle signal abnormality measuring approximately 1.9 x 0.7 cm. Imaging findings are concerning for underlying neoplasm within the head of pancreas. Advise further evaluation with ERCP and endoscopic ultrasound. Electronically Signed   By: Kerby Moors M.D.   On: 05/22/2022 04:31   CT ABDOMEN PELVIS W CONTRAST  Result Date: 05/21/2022 CLINICAL DATA:  Right upper  quadrant pain EXAM: CT ABDOMEN AND PELVIS WITH CONTRAST TECHNIQUE: Multidetector CT imaging of the abdomen and pelvis was performed using the standard protocol following bolus administration of intravenous contrast. RADIATION DOSE REDUCTION: This exam was performed according to the departmental dose-optimization program which includes automated exposure control, adjustment of the mA and/or kV according to patient size and/or use of iterative reconstruction technique. CONTRAST:  39m OMNIPAQUE IOHEXOL 350 MG/ML SOLN COMPARISON:  Abdominal sonogram 05/20/2022 and CT AP 08/01/2012 FINDINGS: Lower chest: Atelectasis and ground-glass attenuation noted within the posterior lung bases. No pleural fluid or airspace disease. Subpleural nodule in the anterior right upper lobe measures 2 mm Hepatobiliary: No focal liver lesion identified. Stones noted on recent gallbladder ultrasound are not CT visible. There is no gallbladder wall thickening or pericholecystic fluid identified. Dilatation of the proximal common bile  duct measures up to 1.3 cm. Abrupt termination the dilated common bile duct at the level of the head of pancreas, image 42/7. Pancreas: Unremarkable. No pancreatic ductal dilatation or surrounding inflammatory changes. Spleen: Normal in size without focal abnormality. Adrenals/Urinary Tract: Adrenal glands are unremarkable. Kidneys are normal, without renal calculi, focal lesion, or hydronephrosis. Bladder is unremarkable. Stomach/Bowel: Stomach is normal. The appendix is not confidently identified. No bowel wall thickening, inflammation, or distension. Vascular/Lymphatic: Aortic atherosclerosis. No aneurysm. No signs abdominopelvic adenopathy. Reproductive: Uterus and bilateral adnexa are unremarkable. Other: No ascites or focal fluid collections. No signs of pneumoperitoneum. Musculoskeletal: No acute or significant osseous findings. IMPRESSION: 1. Dilatation of the proximal common bile duct measures up to 1.3  cm. Abrupt termination the dilated common bile duct at the level of the head of pancreas. There is also main duct dilatation which abruptly terminates at the level of the head of pancreas, proximal to the ampulla. Imaging findings are concerning for underlying obstructing lesion within the head of pancreas. Further evaluation with contrast enhanced MRI/MRCP is advised. 2. Stones noted on recent gallbladder ultrasound are not CT visible. There is no gallbladder wall thickening or pericholecystic fluid identified. 3. 2 mm right upper lobe lung nodule. Nonspecific. See lung cancer screening CT from 04/15/2022 for follow-up recommendations. 4.  Aortic Atherosclerosis (ICD10-I70.0). Electronically Signed   By: Kerby Moors M.D.   On: 05/21/2022 13:50    Assessment/Plan:   Principal Problem:   Obstructive jaundice Active Problems:   Elevated LFTs   Abnormal CT scan, gastrointestinal tract   Patient Summary: Michelle Williams is a 62 y.o. person living with a history of hypertension, COPD, cervical cancer s/p cryotherapy, anxiety, and possible iron deficiency anemia who presented with 4 weeks of progressive right upper quadrant pain, jaundice, diarrhea, and fatigue and admitted for obstructive jaundice with evidence of a lesion near the head of the pancreas    Obstructive jaundice 2/2 a lesion near the head of the pancreas Conjugated hyperbilirubinemia Elevated LFTs/alk phos Overall stable with no large changes in labs.  Ultrasound and CT imaging showing dilation of the extrahepatic biliary system with a lesion near the head of the pancreas.  Was unable to complete MRCP last night but similar findings were seen.  She will need ERCP and we are working with GI to schedule this either today or tomorrow. - GI consulted, appreciate their recommendations - N.p.o. for hopeful ERCP today, if not may start a diet - N.p.o. at midnight if ERCP tomorrow - Hold antibiotics for now, start if she develops fever or  leukocytosis - Holding pain medication but can add if pain worsens or becomes constant   Hypokalemia Abnormal EKG Presented with K of 2.9, up to 3.1 today.  EKG here without significant changes from 01/2022 but there is persistent LVH and T wave inversions in the inferior lateral leads. She has a cardiology follow-up this week so we will notify Dr. Irish Lack and see if he would like to get the echo while she is here. - IV potassium - AM BMP   Hypertension Prescribed lisinopril, triamterene/hydrochlorothiazide, and diltiazem.  - Continue lisinopril 10 mg daily - Hold triamterene/hydrochlorothiazide and diltiazem until reliable med rec has been completed   COPD Tobacco use disorder Pulmonary nodule - Continue Singulair 10 mg daily - Albuterol nebulizer as needed - Continue Chantix 0.5 mg twice daily  Diet: NPO IVF: None, VTE: Enoxaparin Code: DNR  Dispo: Anticipated discharge to Home pending further evaluation of acute presentation.  [name], DO Internal Medicine Resident PGY-1 Pager: [phone]  Please contact the on call pager after 5 pm and on weekends at [phone].

## 2022-05-22 NOTE — Progress Notes (Signed)
Internal Medicine Attending:   I saw and examined the patient. I reviewed the resident's H&P note and I agree with the resident's findings and plan as documented in the resident's note.   In brief, patient is 62 year old female with a past medical history of hypertension, COPD, cervical cancer status post cryotherapy, anxiety who presented to the ED with progressive right upper quadrant pain and diarrhea over the last 3 to 4 weeks.  Approximately 2 weeks ago patient noted her skin was turning yellow and she started having itching of her palms.  She went to her PCP who ordered a right upper quadrant ultrasound and when it resulted she was asked to come to the ED for further evaluation.  Patient denies any weight loss or decreased appetite.  No fevers or chills.  Patient does complain of mild nausea but no vomiting.  Patient states that she started having diarrhea and stated that her stool was orange in color.  Still states that her urine turned orange as well.  Today, patient states that she feels well and denies any new complaints.  States that her urine is starting to turn yellow from orange.  Vitals:   05/22/22 0500 05/22/22 0809  BP: 133/86 (!) 145/88  Pulse: 66 69  Resp:  17  Temp: 99.3 F (37.4 C) 99.4 F (37.4 C)  SpO2: 97% 97%   On exam, patient is lying in bed in no apparent distress.  Lungs are clear to auscultation bilaterally.  Cardiovascular exam reveals regular rate and rhythm with normal heart sounds.  Abdomen is soft, mild tenderness to palpation right upper quadrant, nondistended with normoactive bowel sounds.  Lower extremities are nontender to palpation with trace edema bilaterally.  Patient has a normal mood and affect.  Skin was noted to be jaundiced.  On HEENT exam patient had yellow discoloration of her sclera as well as yellowish discoloration on the bottom of her tongue.  Assessment and plan:  1.  Obstructive jaundice likely secondary to lesion in the head of the  pancreas: -Patient presented to ED with right upper quadrant abdominal pain, diarrhea and jaundice and was noted to have significantly elevated LFTs with total bilirubin up to 14.9 and transaminases in the 100s as well as an elevated alkaline phosphatase of 430.  Patient also had imaging in the ED which showed marked intrahepatic and common bile duct dilatation as well as dilatation of the main duct with abrupt termination of the common bile duct and head of the pancreas.  There is subtle signal abnormality 1.9 x 0.7 cm which is suspicious for an underlying neoplasm.  Given the constellation of her symptoms, lab work as well as imaging and physical exam findings her overall picture is concerning for obstructive jaundice secondary to pancreatic neoplasm -GI follow-up and recommendations appreciated. -Patient will need an ERCP which will likely be done tomorrow.  Await GI follow-up from today -No evidence of underlying infection currently with no leukocytosis and no fevers.  Will continue to monitor closely -Of note, patient was significantly hypokalemic with a potassium of 2.9 on admission likely secondary to her diarrhea.  Will supplement her potassium today -No further workup at this time -Will continue to monitor closely

## 2022-05-23 ENCOUNTER — Inpatient Hospital Stay (HOSPITAL_COMMUNITY): Payer: 59 | Admitting: Certified Registered"

## 2022-05-23 ENCOUNTER — Inpatient Hospital Stay (HOSPITAL_COMMUNITY): Payer: 59

## 2022-05-23 ENCOUNTER — Other Ambulatory Visit: Payer: 59

## 2022-05-23 ENCOUNTER — Encounter (HOSPITAL_COMMUNITY)
Admission: EM | Disposition: A | Discharge status: Home / Self Care | Payer: Self-pay | Source: Home / Self Care | Attending: Infectious Diseases

## 2022-05-23 ENCOUNTER — Encounter (HOSPITAL_COMMUNITY): Payer: Self-pay | Admitting: Internal Medicine

## 2022-05-23 DIAGNOSIS — K831 Obstruction of bile duct: Secondary | ICD-10-CM | POA: Diagnosis not present

## 2022-05-23 DIAGNOSIS — D649 Anemia, unspecified: Secondary | ICD-10-CM

## 2022-05-23 DIAGNOSIS — F419 Anxiety disorder, unspecified: Secondary | ICD-10-CM

## 2022-05-23 DIAGNOSIS — I10 Essential (primary) hypertension: Secondary | ICD-10-CM | POA: Diagnosis not present

## 2022-05-23 DIAGNOSIS — F17210 Nicotine dependence, cigarettes, uncomplicated: Secondary | ICD-10-CM

## 2022-05-23 HISTORY — PX: ERCP: SHX5425

## 2022-05-23 LAB — MAGNESIUM: Magnesium: 2.2 mg/dL (ref 1.7–2.4)

## 2022-05-23 LAB — COMPREHENSIVE METABOLIC PANEL
ALT: 368 U/L — ABNORMAL HIGH (ref 0–44)
AST: 257 U/L — ABNORMAL HIGH (ref 15–41)
Albumin: 2.8 g/dL — ABNORMAL LOW (ref 3.5–5.0)
Alkaline Phosphatase: 458 U/L — ABNORMAL HIGH (ref 38–126)
Anion gap: 11 (ref 5–15)
BUN: 8 mg/dL (ref 6–20)
CO2: 23 mmol/L (ref 22–32)
Calcium: 8.9 mg/dL (ref 8.9–10.3)
Chloride: 100 mmol/L (ref 98–111)
Creatinine, Ser: 0.65 mg/dL (ref 0.44–1.00)
GFR, Estimated: >60 mL/min (ref >60)
Glucose, Bld: 102 mg/dL — ABNORMAL HIGH (ref 70–99)
Potassium: 3.3 mmol/L — ABNORMAL LOW (ref 3.5–5.1)
Sodium: 134 mmol/L — ABNORMAL LOW (ref 135–145)
Total Bilirubin: 15.5 mg/dL — ABNORMAL HIGH (ref 0.3–1.2)
Total Protein: 7 g/dL (ref 6.5–8.1)

## 2022-05-23 LAB — CBC
HCT: 34 % — ABNORMAL LOW (ref 36.0–46.0)
Hemoglobin: 12 g/dL (ref 12.0–15.0)
MCH: 31.6 pg (ref 26.0–34.0)
MCHC: 35.3 g/dL (ref 30.0–36.0)
MCV: 89.5 fL (ref 80.0–100.0)
Platelets: 305 10*3/uL (ref 150–400)
RBC: 3.8 MIL/uL — ABNORMAL LOW (ref 3.87–5.11)
RDW: 18 % — ABNORMAL HIGH (ref 11.5–15.5)
WBC: 10.5 10*3/uL (ref 4.0–10.5)
nRBC: 0 % (ref 0.0–0.2)

## 2022-05-23 SURGERY — ERCP, WITH INTERVENTION IF INDICATED
Anesthesia: General

## 2022-05-23 MED ORDER — GLUCAGON HCL RDNA (DIAGNOSTIC) 1 MG IJ SOLR
INTRAMUSCULAR | Status: DC | PRN
  Administered 2022-05-23 (×3): .25 mg via INTRAVENOUS

## 2022-05-23 MED ORDER — POTASSIUM CHLORIDE 10 MEQ/100ML IV SOLN
10.0000 meq | INTRAVENOUS | Status: AC
  Administered 2022-05-23 (×2): 10 meq via INTRAVENOUS
  Filled 2022-05-23: qty 100

## 2022-05-23 MED ORDER — CIPROFLOXACIN IN D5W 400 MG/200ML IV SOLN
INTRAVENOUS | Status: DC | PRN
  Administered 2022-05-23: 400 mg via INTRAVENOUS

## 2022-05-23 MED ORDER — CIPROFLOXACIN IN D5W 400 MG/200ML IV SOLN
INTRAVENOUS | Status: AC
  Filled 2022-05-23: qty 200

## 2022-05-23 MED ORDER — PROPOFOL 10 MG/ML IV BOLUS
INTRAVENOUS | Status: DC | PRN
  Administered 2022-05-23: 80 mg via INTRAVENOUS
  Administered 2022-05-23: 20 mg via INTRAVENOUS

## 2022-05-23 MED ORDER — GLUCAGON HCL RDNA (DIAGNOSTIC) 1 MG IJ SOLR
INTRAMUSCULAR | Status: AC
  Filled 2022-05-23: qty 2

## 2022-05-23 MED ORDER — DICLOFENAC SUPPOSITORY 100 MG
RECTAL | Status: AC
  Filled 2022-05-23: qty 1

## 2022-05-23 MED ORDER — LACTATED RINGERS IV SOLN: INTRAVENOUS | Status: DC

## 2022-05-23 MED ORDER — FENTANYL CITRATE (PF) 250 MCG/5ML IJ SOLN
INTRAMUSCULAR | Status: DC | PRN
  Administered 2022-05-23 (×2): 50 ug via INTRAVENOUS

## 2022-05-23 MED ORDER — PHENYLEPHRINE HCL-NACL 20-0.9 MG/250ML-% IV SOLN
INTRAVENOUS | Status: DC | PRN
  Administered 2022-05-23: 30 ug/min via INTRAVENOUS

## 2022-05-23 MED ORDER — LIDOCAINE 2% (20 MG/ML) 5 ML SYRINGE
INTRAMUSCULAR | Status: DC | PRN
  Administered 2022-05-23: 40 mg via INTRAVENOUS

## 2022-05-23 MED ORDER — ALBUTEROL SULFATE HFA 108 (90 BASE) MCG/ACT IN AERS
INHALATION_SPRAY | RESPIRATORY_TRACT | Status: DC | PRN
  Administered 2022-05-23: 2 via RESPIRATORY_TRACT

## 2022-05-23 MED ORDER — DICLOFENAC SUPPOSITORY 100 MG
RECTAL | Status: DC | PRN
  Administered 2022-05-23: 100 mg via RECTAL

## 2022-05-23 MED ORDER — ROCURONIUM BROMIDE 10 MG/ML (PF) SYRINGE
PREFILLED_SYRINGE | INTRAVENOUS | Status: DC | PRN
  Administered 2022-05-23: 60 mg via INTRAVENOUS

## 2022-05-23 MED ORDER — ONDANSETRON HCL 4 MG/2ML IJ SOLN
INTRAMUSCULAR | Status: DC | PRN
  Administered 2022-05-23: 4 mg via INTRAVENOUS

## 2022-05-23 MED ORDER — FENTANYL CITRATE (PF) 100 MCG/2ML IJ SOLN
INTRAMUSCULAR | Status: AC
  Filled 2022-05-23: qty 2

## 2022-05-23 MED ORDER — SUGAMMADEX SODIUM 200 MG/2ML IV SOLN
INTRAVENOUS | Status: DC | PRN
  Administered 2022-05-23 (×2): 100 mg via INTRAVENOUS

## 2022-05-23 MED ORDER — PHENYLEPHRINE 80 MCG/ML (10ML) SYRINGE FOR IV PUSH (FOR BLOOD PRESSURE SUPPORT)
PREFILLED_SYRINGE | INTRAVENOUS | Status: DC | PRN
  Administered 2022-05-23: 80 ug via INTRAVENOUS
  Administered 2022-05-23: 240 ug via INTRAVENOUS

## 2022-05-23 MED ORDER — DEXAMETHASONE SODIUM PHOSPHATE 10 MG/ML IJ SOLN
INTRAMUSCULAR | Status: DC | PRN
  Administered 2022-05-23: 10 mg via INTRAVENOUS

## 2022-05-23 NOTE — Anesthesia Preprocedure Evaluation (Addendum)
Anesthesia Evaluation  Patient identified by MRN, date of birth, ID band Patient awake    Reviewed: Allergy & Precautions, NPO status , Patient's Chart, lab work & pertinent test results  Airway Mallampati: III  TM Distance: >3 FB Neck ROM: Full    Dental  (+) Edentulous Upper, Edentulous Lower, Dental Advisory Given   Pulmonary Current Smoker and Patient abstained from smoking.   Pulmonary exam normal breath sounds clear to auscultation       Cardiovascular hypertension, Normal cardiovascular exam Rhythm:Regular Rate:Normal     Neuro/Psych  PSYCHIATRIC DISORDERS Anxiety     negative neurological ROS     GI/Hepatic negative GI ROS, Neg liver ROS,,,  Endo/Other  negative endocrine ROS    Renal/GU negative Renal ROS  negative genitourinary   Musculoskeletal  (+) Arthritis , Rheumatoid disorders,    Abdominal   Peds  Hematology  (+) Blood dyscrasia, anemia   Anesthesia Other Findings 62 y.o.person living with a history of hypertension, COPD, cervical cancer s/p cryotherapy, anxiety, and possible iron deficiency anemia who presented with 4 weeks of progressive right upper quadrant pain, jaundice, diarrhea, and fatigueand admitted forobstructive jaundice with evidence of a lesion near the head of the pancreas  Reproductive/Obstetrics                             Anesthesia Physical Anesthesia Plan  ASA: 3  Anesthesia Plan: General   Post-op Pain Management: Minimal or no pain anticipated   Induction: Intravenous  PONV Risk Score and Plan: 2 and Midazolam, Dexamethasone and Ondansetron  Airway Management Planned: Oral ETT  Additional Equipment:   Intra-op Plan:   Post-operative Plan: Extubation in OR  Informed Consent: I have reviewed the patients History and Physical, chart, labs and discussed the procedure including the risks, benefits and alternatives for the proposed anesthesia  with the patient or authorized representative who has indicated his/her understanding and acceptance.   Patient has DNR.  Suspend DNR.   Dental advisory given  Plan Discussed with: CRNA  Anesthesia Plan Comments:        Anesthesia Quick Evaluation

## 2022-05-23 NOTE — Transfer of Care (Signed)
Immediate Anesthesia Transfer of Care Note  Patient: Michelle Williams  Procedure(s) Performed: ENDOSCOPIC RETROGRADE CHOLANGIOPANCREATOGRAPHY (ERCP)  Patient Location: Endoscopy Unit  Anesthesia Type:General  Level of Consciousness: drowsy and patient cooperative  Airway & Oxygen Therapy: Patient Spontanous Breathing and Patient connected to face mask oxygen  Post-op Assessment: Report given to RN and Post -op Vital signs reviewed and stable  Post vital signs: Reviewed and stable  Last Vitals:  Vitals Value Taken Time  BP 147/91 05/23/22 1505  Temp    Pulse 85 05/23/22 1510  Resp 18 05/23/22 1510  SpO2 97 % 05/23/22 1510  Vitals shown include unvalidated device data.  Last Pain:  Vitals:   05/23/22 1505  TempSrc:   PainSc: 0-No pain      Patients Stated Pain Goal: 0 (85/90/93 1121)  Complications: No notable events documented.

## 2022-05-23 NOTE — Progress Notes (Signed)
HD#2 Subjective:   Summary: Michelle Williams is a 62 y.o. person living with a history of hypertension, COPD, cervical cancer s/p cryotherapy, anxiety, and possible iron deficiency anemia who presented with 4 weeks of progressive right upper quadrant pain, jaundice, diarrhea, and fatigue and admitted for obstructive jaundice with evidence of a lesion near the head of the pancreas.  Overnight Events: None  Patient is doing well this morning without any new or worsening complaints.   Objective:  Vital signs in last 24 hours: Vitals:   05/22/22 0809 05/22/22 1559 05/22/22 2028 05/23/22 0449  BP: (!) 145/88 (!) 162/91 (!) 144/94 (!) 138/96  Pulse: 69 78 80 79  Resp: 17 16    Temp: 99.4 F (37.4 C) 99.5 F (37.5 C) 99.6 F (37.6 C) 99.5 F (37.5 C)  TempSrc: Oral Oral Oral Oral  SpO2: 97% 97% 98% 97%   Supplemental O2: Room Air SpO2: 97 %   Physical Exam:  Constitutional: Obviously jaundiced but well-appearing female. In no acute distress. Eyes: Prominent scleral icterus Cardio:Regular rate and rhythm.  Pulm:Clear to auscultation bilaterally. Normal work of breathing on room air. Abdomen: Soft, non-distended, positive bowel sounds.  Mild to moderate tenderness to palpation in the right upper quadrant MSK: Trace lower extremity edema Skin: Jaundiced.  Warm and dry. Neuro:Alert and oriented x3. No focal deficit noted. Psych:Pleasant mood and affect.  There were no vitals filed for this visit.   Intake/Output Summary (Last 24 hours) at 05/23/2022 0704 Last data filed at 05/22/2022 2347 Gross per 24 hour  Intake 820.33 ml  Output --  Net 820.33 ml   Net IO Since Admission: 820.33 mL [05/23/22 0704]  Pertinent Labs:    Latest Ref Rng & Units 05/23/2022    2:30 AM 05/22/2022    3:28 AM 05/21/2022   12:24 PM  CBC  WBC 4.0 - 10.5 K/uL 10.5  10.1  9.7   Hemoglobin 12.0 - 15.0 g/dL 12.0  12.3  12.3   Hematocrit 36.0 - 46.0 % 34.0  32.9  34.6   Platelets 150 - 400 K/uL  305  278  305        Latest Ref Rng & Units 05/23/2022    2:30 AM 05/22/2022    3:28 AM 05/21/2022   12:24 PM  CMP  Glucose 70 - 99 mg/dL 102  101  138   BUN 6 - 20 mg/dL 8  <5  5   Creatinine 0.44 - 1.00 mg/dL 0.65  0.58  0.60   Sodium 135 - 145 mmol/L 134  134  135   Potassium 3.5 - 5.1 mmol/L 3.3  3.1  2.9   Chloride 98 - 111 mmol/L 100  101  100   CO2 22 - 32 mmol/L '23  23  25   '$ Calcium 8.9 - 10.3 mg/dL 8.9  8.9  8.8   Total Protein 6.5 - 8.1 g/dL 7.0  6.6  6.9   Total Bilirubin 0.3 - 1.2 mg/dL 15.5  13.6  14.4   Alkaline Phos 38 - 126 U/L 458  389  430   AST 15 - 41 U/L 257  272  273   ALT 0 - 44 U/L 368  382  428     Imaging: No results found.  Assessment/Plan:   Principal Problem:   Obstructive jaundice Active Problems:   Elevated LFTs   Abnormal magnetic resonance cholangiopancreatography (MRCP)   Patient Summary: Michelle Williams is a 62 y.o. person living with  a history of hypertension, COPD, cervical cancer s/p cryotherapy, anxiety, and possible iron deficiency anemia who presented with 4 weeks of progressive right upper quadrant pain, jaundice, diarrhea, and fatigue and admitted for obstructive jaundice with evidence of a lesion near the head of the pancreas    Obstructive jaundice 2/2 a lesion near the head of the pancreas Conjugated hyperbilirubinemia Elevated LFTs/alk phos Overall stable with no large changes in labs.  Ultrasound and CT imaging showing dilation of the extrahepatic biliary system with a lesion near the head of the pancreas.  Was unable to complete MRCP but similar findings were seen.  ERCP today and will follow up after. - GI consulted, appreciate their recommendations - N.p.o. for ERCP today - Hold antibiotics for now, start if she develops fever or leukocytosis - Holding pain medication but can add if pain worsens or becomes constant   Hypokalemia Abnormal EKG Presented with K of 2.9, up to 3.3 today.  EKG here without significant  changes from 01/2022 but there is persistent LVH and T wave inversions in the inferior lateral leads. She has a cardiology follow-up this week so we will notify Dr. Irish Lack and see if he would like to get the echo while she is here. - IV potassium - AM BMP   Hypertension Prescribed lisinopril, triamterene/hydrochlorothiazide, and diltiazem.  - Continue lisinopril 10 mg daily - Hold triamterene/hydrochlorothiazide and diltiazem until reliable med rec has been completed   COPD Tobacco use disorder Pulmonary nodule - Continue Singulair 10 mg daily - Albuterol nebulizer as needed - Continue Chantix 0.5 mg twice daily  Diet: NPO IVF: None, VTE: Enoxaparin Code: DNR  Dispo: Anticipated discharge to Home pending further evaluation of acute presentation.  [name], DO Internal Medicine Resident PGY-1 Pager: [phone]  Please contact the on call pager after 5 pm and on weekends at [phone].

## 2022-05-23 NOTE — Op Note (Addendum)
[facility] Patient Name: Michelle Williams Procedure Date : 05/23/2022 MRN: [id number] Attending MD: Ladene Artist , MD, [id number] Date of Birth: 06/29/1960 CSN: [id number] Age: 62 Admit Type: Inpatient Procedure:                ERCP Indications:              Abnormal MRCP, Jaundice, Elevated liver enzymes Providers:                Pricilla Riffle. Fuller Plan, MD, Dulcy Fanny, Darliss Cheney,                            Technician, Viann Fish, CRNA Referring MD:             Aldine Contes, MD Medicines:                General Anesthesia Complications:            No immediate complications. Estimated Blood Loss:     Estimated blood loss: none. Procedure:                Pre-Anesthesia Assessment:                           - Prior to the procedure, a History and Physical                            was performed, and patient medications and                            allergies were reviewed. The patient's tolerance of                            previous anesthesia was also reviewed. The risks                            and benefits of the procedure and the sedation                            options and risks were discussed with the patient.                            All questions were answered, and informed consent                            was obtained. Prior Anticoagulants: The patient has                            taken no anticoagulant or antiplatelet agents. ASA                            Grade Assessment: II - A patient with mild systemic                            disease. After reviewing the risks and benefits,  the patient was deemed in satisfactory condition to                            undergo the procedure.                           After obtaining informed consent, the scope was                            passed under direct vision. Throughout the                            procedure, the patient's blood pressure, pulse, and                             oxygen saturations were monitored continuously. The                            [employer] single use                            duodenoscope was introduced through the mouth, and                            used to inject contrast into without successful                            cannulation. The ERCP was technically difficult and                            complex due to challenging cannulation. The patient                            tolerated the procedure well. Scope In: Scope Out: Findings:      The scout film was normal. The esophagus was successfully intubated       under direct vision. The scope was advanced to a normal major papilla in       the descending duodenum without detailed examination of the pharynx,       larynx and associated structures, and upper GI tract. The upper GI tract       was grossly normal. The papilla appeared normal with redundant folds       surrounding. The standard short-nosed traction sphincterotome, the       smaller caliber short-nosed sphincterotome and the tapered tip catheter       were all tried. After multiple attempts was able to enter the papillary       orifice only 2-3 mm. Unable to freely cannulate or enter either duct       with an 0.035 or an 0.025 guidewire. Positioning was difficult however       best position with the scope in the semi-long position. Impression:               - Normal appearing major papilla.                           -  Unable to cannulate either duct. Recommendation:           - Return patient to hospital ward for ongoing care.                           - Clear liquid diet for 2 hours then resume prior                            diet.                           - Observe patient's clinical course.                           - Consider repeat attempt at ERCP with Dr.                            Rush Landmark vs. PTC. Procedure Code(s):        --- Professional ---                            [phone], Esophagogastroduodenoscopy, flexible,                            transoral; diagnostic, including collection of                            specimen(s) by brushing or washing, when performed                            (separate procedure) Diagnosis Code(s):        --- Professional ---                           R17, Unspecified jaundice                           R74.8, Abnormal levels of other serum enzymes                           R93.2, Abnormal findings on diagnostic imaging of                            liver and biliary tract CPT copyright 2022 [employer]. All rights reserved. The codes documented in this report are preliminary and upon coder review may  be revised to meet current compliance requirements. Ladene Artist, MD 05/23/2022 3:03:23 PM This report has been signed electronically. Number of Addenda: 0

## 2022-05-23 NOTE — Anesthesia Procedure Notes (Signed)
Procedure Name: Intubation Date/Time: 05/23/2022 1:30 PM  Performed by: Griffin Dakin, CRNAPre-anesthesia Checklist: Patient identified, Emergency Drugs available, Suction available and Patient being monitored Patient Re-evaluated:Patient Re-evaluated prior to induction Oxygen Delivery Method: Circle system utilized Preoxygenation: Pre-oxygenation with 100% oxygen Induction Type: IV induction Ventilation: Mask ventilation without difficulty Laryngoscope Size: Mac and 4 Grade View: Grade I Tube type: Oral Tube size: 7.0 mm Number of attempts: 1 Airway Equipment and Method: Stylet and Oral airway Placement Confirmation: ETT inserted through vocal cords under direct vision, positive ETCO2 and breath sounds checked- equal and bilateral Secured at: 23 cm Tube secured with: Tape Dental Injury: Teeth and Oropharynx as per pre-operative assessment

## 2022-05-23 NOTE — Interval H&P Note (Signed)
History and Physical Interval Note:  05/23/2022 1:05 PM  Michelle Williams  has presented today for surgery, with the diagnosis of jaundice, biliary obstruction.  The various methods of treatment have been discussed with the patient and family. After consideration of risks, benefits and other options for treatment, the patient has consented to  Procedure(s): ENDOSCOPIC RETROGRADE CHOLANGIOPANCREATOGRAPHY (ERCP) (N/A) as a surgical intervention.  The patient's history has been reviewed, patient examined, no change in status, stable for surgery.  I have reviewed the patient's chart and labs.  Questions were answered to the patient's satisfaction.     Pricilla Riffle. Fuller Plan

## 2022-05-23 NOTE — TOC Initial Note (Signed)
Transition of Care [facility]) - Initial/Assessment Note    Patient Details  Name: Michelle Williams MRN: [id number] Date of Birth: 10-17-1960  [facility]) CM/SW Contact:    Ninfa Meeker, RN Phone Number: 05/23/2022, 12:26 PM  Clinical Narrative:                 Transition of Care Screening Note:  Transition of Care [facility]) has reviewed patient and no TOC needs have been identified at this time. We will continue to monitor patient advancement through Interdisciplinary progressions. If new patient transition needs arise, please place a consult.        Patient Goals and CMS Choice            Expected Discharge Plan and Services                                              Prior Living Arrangements/Services                       Activities of Daily Living Home Assistive Devices/Equipment: None ADL Screening (condition at time of admission) Patient's cognitive ability adequate to safely complete daily activities?: Yes Is the patient deaf or have difficulty hearing?: No Does the patient have difficulty seeing, even when wearing glasses/contacts?: No Does the patient have difficulty concentrating, remembering, or making decisions?: No Patient able to express need for assistance with ADLs?: Yes Does the patient have difficulty dressing or bathing?: No Independently performs ADLs?: Yes (appropriate for developmental age) Does the patient have difficulty walking or climbing stairs?: No Weakness of Legs: Both Weakness of Arms/Hands: None  Permission Sought/Granted                  Emotional Assessment              Admission diagnosis:  Hypokalemia [E87.6] Gallstones [K80.20] Serum total bilirubin elevated [R17] Elevated LFTs [R79.89] Obstructive jaundice [K83.1] Patient Active Problem List   Diagnosis Date Noted   Obstructive jaundice 05/21/2022   Elevated LFTs 05/21/2022   Abnormal magnetic resonance  cholangiopancreatography (MRCP) 05/21/2022   Anxiety 05/09/2014   Chronic pain syndrome 05/09/2014   Degenerative disc disease, cervical 05/09/2014   PCP:  Cipriano Mile, NP Pharmacy:   [facility] [address] [place] [address] Phone: [phone] Fax: [phone]     Social Determinants of Health (SDOH) Social History: SDOH Screenings   Food Insecurity: No Food Insecurity (05/21/2022)  Transportation Needs: No Transportation Needs (05/21/2022)  Utilities: Not At Risk (05/21/2022)  Tobacco Use: High Risk (05/20/2022)   SDOH Interventions:     Readmission Risk Interventions     No data to display

## 2022-05-24 ENCOUNTER — Encounter (HOSPITAL_COMMUNITY): Payer: Self-pay | Admitting: Gastroenterology

## 2022-05-24 DIAGNOSIS — R7989 Other specified abnormal findings of blood chemistry: Secondary | ICD-10-CM | POA: Diagnosis not present

## 2022-05-24 DIAGNOSIS — K838 Other specified diseases of biliary tract: Secondary | ICD-10-CM

## 2022-05-24 DIAGNOSIS — K831 Obstruction of bile duct: Secondary | ICD-10-CM | POA: Diagnosis not present

## 2022-05-24 LAB — COMPREHENSIVE METABOLIC PANEL
ALT: 345 U/L — ABNORMAL HIGH (ref 0–44)
AST: 244 U/L — ABNORMAL HIGH (ref 15–41)
Albumin: 2.7 g/dL — ABNORMAL LOW (ref 3.5–5.0)
Alkaline Phosphatase: 497 U/L — ABNORMAL HIGH (ref 38–126)
Anion gap: 10 (ref 5–15)
BUN: 8 mg/dL (ref 6–20)
CO2: 23 mmol/L (ref 22–32)
Calcium: 9.1 mg/dL (ref 8.9–10.3)
Chloride: 101 mmol/L (ref 98–111)
Creatinine, Ser: 0.76 mg/dL (ref 0.44–1.00)
GFR, Estimated: >60 mL/min (ref >60)
Glucose, Bld: 168 mg/dL — ABNORMAL HIGH (ref 70–99)
Potassium: 3.5 mmol/L (ref 3.5–5.1)
Sodium: 134 mmol/L — ABNORMAL LOW (ref 135–145)
Total Bilirubin: 15.3 mg/dL — ABNORMAL HIGH (ref 0.3–1.2)
Total Protein: 6.7 g/dL (ref 6.5–8.1)

## 2022-05-24 LAB — CBC
HCT: 34 % — ABNORMAL LOW (ref 36.0–46.0)
Hemoglobin: 11.9 g/dL — ABNORMAL LOW (ref 12.0–15.0)
MCH: 31.5 pg (ref 26.0–34.0)
MCHC: 35 g/dL (ref 30.0–36.0)
MCV: 89.9 fL (ref 80.0–100.0)
Platelets: 325 10*3/uL (ref 150–400)
RBC: 3.78 MIL/uL — ABNORMAL LOW (ref 3.87–5.11)
RDW: 18.2 % — ABNORMAL HIGH (ref 11.5–15.5)
WBC: 15 10*3/uL — ABNORMAL HIGH (ref 4.0–10.5)
nRBC: 0 % (ref 0.0–0.2)

## 2022-05-24 MED ORDER — POTASSIUM CHLORIDE CRYS ER 20 MEQ PO TBCR
40.0000 meq | EXTENDED_RELEASE_TABLET | Freq: Two times a day (BID) | ORAL | Status: AC
  Administered 2022-05-24 (×2): 40 meq via ORAL
  Filled 2022-05-24 (×2): qty 2

## 2022-05-24 MED ORDER — PANTOPRAZOLE SODIUM 40 MG PO TBEC
40.0000 mg | DELAYED_RELEASE_TABLET | Freq: Every day | ORAL | Status: DC
  Administered 2022-05-24 – 2022-05-28 (×4): 40 mg via ORAL
  Filled 2022-05-24 (×5): qty 1

## 2022-05-24 MED ORDER — KETOROLAC TROMETHAMINE 15 MG/ML IJ SOLN
15.0000 mg | Freq: Once | INTRAMUSCULAR | Status: AC
  Administered 2022-05-24: 15 mg via INTRAVENOUS
  Filled 2022-05-24: qty 1

## 2022-05-24 NOTE — Progress Notes (Addendum)
[facility] ROUNDING NOTE   Subjective: Complains of throat pain.  Anxious about the next step   Objective: Vital signs in last 24 hours: Temp:  [97.8 F (36.6 C)-98.5 F (36.9 C)] 98.5 F (36.9 C) (01/23 1611) Pulse Rate:  [53-75] 75 (01/23 1611) Resp:  [18] 18 (01/23 1611) BP: (141-166)/(84-92) 166/92 (01/23 1611) SpO2:  [98 %-100 %] 99 % (01/23 1611) Last BM Date : 05/23/22 General: NAD Abdomen: Soft, no distention or tenderness  Intake/Output from previous day: 01/22 0701 - 01/23 0700 In: 640 [P.O.:240; I.V.:400] Out: -  Intake/Output this shift: Total I/O In: 120 [P.O.:120] Out: -    Lab Results: Recent Labs    05/22/22 0328 05/23/22 0230 05/24/22 0555  WBC 10.1 10.5 15.0*  HGB 12.3 12.0 11.9*  PLT 278 305 325  MCV 86.8 89.5 89.9   BMET Recent Labs    05/22/22 0328 05/23/22 0230 05/24/22 0555  NA 134* 134* 134*  K 3.1* 3.3* 3.5  CL 101 100 101  CO2 '23 23 23  '$ GLUCOSE 101* 102* 168*  BUN <5* 8 8  CREATININE 0.58 0.65 0.76  CALCIUM 8.9 8.9 9.1   LFT Recent Labs    05/22/22 0328 05/23/22 0230 05/24/22 0555  PROT 6.6 7.0 6.7  ALBUMIN 2.8* 2.8* 2.7*  AST 272* 257* 244*  ALT 382* 368* 345*  ALKPHOS 389* 458* 497*  BILITOT 13.6* 15.5* 15.3*   PT/INR No results for input(s): "INR" in the last 72 hours.    Imaging/Other results: DG ERCP  Result Date: 05/23/2022 CLINICAL DATA:  62 year old female with a history of jaundice EXAM: ERCP TECHNIQUE: Multiple spot images obtained with the fluoroscopic device and submitted for interpretation post-procedure. FLUOROSCOPY: Radiation Exposure Index (as provided by the fluoroscopic device): 4.5 mGy Kerma COMPARISON:  MR 05/21/2022 FINDINGS: Limited intraoperative fluoroscopic spot images during ERCP. Initial image demonstrates endoscope projecting over the upper abdomen. Subsequently there is deployment of the safety wire, however, no infusion of contrast on the submitted images. IMPRESSION:  Limited images during ERCP as above. Please refer to the dictated operative report for full details of intraoperative findings and procedure. Electronically Signed   By: Corrie Mckusick D.O.   On: 05/23/2022 15:50   DG C-Arm 1-60 Min-No Report  Result Date: 05/23/2022 Fluoroscopy was utilized by the requesting physician.  No radiographic interpretation.      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 The patient agreed with the plan and demonstrated an understanding of the instructions.    Damaris Hippo , MD [phone]  [facility]

## 2022-05-24 NOTE — Care Management Important Message (Signed)
Important Message  Patient Details  Name: Michelle Williams MRN: [id number] Date of Birth: 1961-02-20   Medicare Important Message Given:  Yes     Hannah Beat 05/24/2022, 11:47 AM

## 2022-05-24 NOTE — Anesthesia Postprocedure Evaluation (Signed)
Anesthesia Post Note  Patient: Michelle Williams  Procedure(s) Performed: ENDOSCOPIC RETROGRADE CHOLANGIOPANCREATOGRAPHY (ERCP)     Patient location during evaluation: PACU Anesthesia Type: General Level of consciousness: awake and alert Pain management: pain level controlled Vital Signs Assessment: post-procedure vital signs reviewed and stable Respiratory status: spontaneous breathing, nonlabored ventilation, respiratory function stable and patient connected to nasal cannula oxygen Cardiovascular status: blood pressure returned to baseline and stable Postop Assessment: no apparent nausea or vomiting Anesthetic complications: no  No notable events documented.  Last Vitals:  Vitals:   05/24/22 0441 05/24/22 0817  BP: (!) 143/87 (!) 147/84  Pulse: (!) 53 72  Resp: 18 18  Temp: 36.6 C 36.9 C  SpO2: 98% 98%    Last Pain:  Vitals:   05/24/22 0817  TempSrc: Oral  PainSc:                  Artrell Lawless S

## 2022-05-24 NOTE — Progress Notes (Signed)
Chief Complaint: Patient was seen in consultation today for obstructive jaundice   Referring Physician(s): Dr. Silverio Decamp  Supervising Physician: Corrie Mckusick  Patient Status: [facility] - In-pt  History of Present Illness: Michelle Williams is a 62 y.o. female admitted with abd pain and jaundice. Imaging finds CBD dilatation with abrupt termination at the level of the head of the pancreas concerning for obstruction. Pt was taken for ERCP but was not successful at cannulating the duct.  IR is asked to eval for PTC and biliary drainage. PMHx, meds, labs, imaging, allergies reviewed. Feels well, no recent fevers, chills, illness.   Past Medical History:  Diagnosis Date   Acute bronchitis    Allergy    Anemia    Anxiety    Arthritis    Cataract    BEGINIING   Cervical cancer ([place])    Degenerative joint disease    UPPER,LOWER BACK   Gallstones    H/O degenerative disc disease    H/O rheumatoid arthritis    Hypertension    Neuropathy    BILATERAL    Past Surgical History:  Procedure Laterality Date   BREAST EXCISIONAL BIOPSY Right    COLONOSCOPY     2018   ERCP N/A 05/23/2022   Procedure: ENDOSCOPIC RETROGRADE CHOLANGIOPANCREATOGRAPHY (ERCP);  Surgeon: Ladene Artist, MD;  Location: [facility];  Service: Gastroenterology;  Laterality: N/A;   IR FALLOPIAN TUBE CATHETERIZATION     POLYPECTOMY      Allergies: Patient has no known allergies.  Medications:  Current Facility-Administered Medications:    albuterol (PROVENTIL) (2.5 MG/3ML) 0.083% nebulizer solution 3 mL, 3 mL, Inhalation, Q6H PRN, Farrel Gordon, DO   lisinopril (ZESTRIL) tablet 10 mg, 10 mg, Oral, Daily, Farrel Gordon, DO, 10 mg at 05/24/22 0835   montelukast (SINGULAIR) tablet 10 mg, 10 mg, Oral, QHS, Farrel Gordon, DO, 10 mg at 05/23/22 2205   pantoprazole (PROTONIX) EC tablet 40 mg, 40 mg, Oral, Daily, [name], DO, 40 mg at 05/24/22 1538   potassium chloride SA (KLOR-CON M) CR tablet 40 mEq, 40  mEq, Oral, BID, [name], DO, 40 mEq at 05/24/22 1129   varenicline (CHANTIX) tablet 0.5 mg, 0.5 mg, Oral, BID, Farrel Gordon, DO, 0.5 mg at 05/24/22 2376    Family History  Problem Relation Age of Onset   Hypertension Mother    Stroke Mother    Early death Father    Colon cancer Brother        unsure age when diagnosed    HIV/AIDS Brother    Diabetes Maternal Grandmother    Colon polyps Neg Hx    Esophageal cancer Neg Hx    Rectal cancer Neg Hx    Stomach cancer Neg Hx    Crohn's disease Neg Hx    Ulcerative colitis Neg Hx     Social History   Socioeconomic History   Marital status: Single    Spouse name: Not on file   Number of children: Not on file   Years of education: Not on file   Highest education level: Not on file  Occupational History   Not on file  Tobacco Use   Smoking status: Every Day    Packs/day: 0.50    Types: Cigarettes    Passive exposure: Never   Smokeless tobacco: Never  Vaping Use   Vaping Use: Never used  Substance and Sexual Activity   Alcohol use: Not Currently    Comment: wine occ   Drug use: No  Sexual activity: Yes    Birth control/protection: None  Other Topics Concern   Not on file  Social History Narrative   Not on file   Social Determinants of Health   Financial Resource Strain: Not on file  Food Insecurity: No Food Insecurity (05/21/2022)   Hunger Vital Sign    Worried About Running Out of Food in the Last Year: Never true    Ran Out of Food in the Last Year: Never true  Transportation Needs: No Transportation Needs (05/21/2022)   PRAPARE - Hydrologist (Medical): No    Lack of Transportation (Non-Medical): No  Physical Activity: Not on file  Stress: Not on file  Social Connections: Not on file    Review of Systems: A 12 point ROS discussed and pertinent positives are indicated in the HPI above.  All other systems are negative.  Review of Systems  Vital Signs: BP (!) 147/84 (BP  Location: Left Arm)   Pulse 72   Temp 98.4 F (36.9 C) (Oral)   Resp 18   SpO2 98%   Physical Exam Constitutional:      Appearance: She is not ill-appearing or toxic-appearing.  HENT:     Mouth/Throat:     Mouth: Mucous membranes are moist.     Pharynx: Oropharynx is clear.  Cardiovascular:     Rate and Rhythm: Normal rate and regular rhythm.     Heart sounds: Normal heart sounds.  Pulmonary:     Effort: Pulmonary effort is normal. No respiratory distress.     Breath sounds: Normal breath sounds.  Abdominal:     General: There is no distension.     Palpations: Abdomen is soft. There is no mass.  Skin:    General: Skin is dry.     Coloration: Skin is jaundiced.  Neurological:     General: No focal deficit present.     Mental Status: She is alert and oriented to person, place, and time.  Psychiatric:        Mood and Affect: Mood normal.        Thought Content: Thought content normal.      Imaging: DG ERCP  Result Date: 05/23/2022 CLINICAL DATA:  62 year old female with a history of jaundice EXAM: ERCP TECHNIQUE: Multiple spot images obtained with the fluoroscopic device and submitted for interpretation post-procedure. FLUOROSCOPY: Radiation Exposure Index (as provided by the fluoroscopic device): 4.5 mGy Kerma COMPARISON:  MR 05/21/2022 FINDINGS: Limited intraoperative fluoroscopic spot images during ERCP. Initial image demonstrates endoscope projecting over the upper abdomen. Subsequently there is deployment of the safety wire, however, no infusion of contrast on the submitted images. IMPRESSION: Limited images during ERCP as above. Please refer to the dictated operative report for full details of intraoperative findings and procedure. Electronically Signed   By: Corrie Mckusick D.O.   On: 05/23/2022 15:50   DG C-Arm 1-60 Min-No Report  Result Date: 05/23/2022 Fluoroscopy was utilized by the requesting physician.  No radiographic interpretation.   MR ABDOMEN MRCP WO  CONTRAST  Result Date: 05/22/2022 CLINICAL DATA:  Jaundice. EXAM: MRI ABDOMEN WITHOUT CONTRAST  (INCLUDING MRCP) TECHNIQUE: Multiplanar multisequence MR imaging of the abdomen was performed. Heavily T2-weighted images of the biliary and pancreatic ducts were obtained, and three-dimensional MRCP images were rendered by post processing. COMPARISON:  CT 05/21/2022 FINDINGS: Note: Exam detail is diminished. Patient aborted exam prior to completion of study. No composed contrast imaging obtained. Additionally, there is motion artifact which further diminishes diagnostic  assessment of the area of concern within the head of pancreas. Lower chest: No pleural fluid identified. Atelectasis noted within both lung bases. Hepatobiliary: Within the limitations of motion artifact there is no focal liver abnormality. No significant gallbladder wall thickening. As noted on recent CT there is marked intrahepatic and common bile duct dilatation. The common bile duct measures 1.4 cm in maximum diameter, image 15/5. Abrupt termination of the common bile duct within the head of pancreas is noted. Pancreas: No pancreatic inflammation. There is diffuse main duct dilatation up to the level of the head of pancreas which measures 6 mm in maximum diameter, image 25/3. The dilated pancreatic duct terminates at a similar level as the dilated common bile duct. Here, there is a focal area of slight increased T2 signal and decreased T1 signal within the head of pancreas measuring approximately 1.9 x 0.7 cm, image 25/3 and image number 64/7. Imaging findings are highly suspicious for occult neoplasm within the head of pancreas, image 15/5. Spleen:  Within normal limits in size and appearance. Adrenals/Urinary Tract: Normal adrenal glands. No kidney mass or hydronephrosis. Stomach/Bowel: Visualized portions within the abdomen are unremarkable. Vascular/Lymphatic: Normal caliber of the abdominal aorta. No upper abdominal adenopathy identified.  Other:  No ascites. Musculoskeletal: No suspicious bone lesions identified. IMPRESSION: 1. Exam detail is diminished. Patient aborted exam prior to completion of study and prior to administration of intravenous contrast material. Additionally, there is motion artifact which further diminishes diagnostic assessment of the area of concern within the head of pancreas. 2. Marked intrahepatic and common bile duct dilatation. Abrupt termination of the common bile duct within the head of pancreas is noted. There is diffuse main duct dilatation up to the level of the head of pancreas which measures 6 mm in maximum diameter. The dilated main duct terminates at the same level as the termination of the CBD. Here, there is a subtle signal abnormality measuring approximately 1.9 x 0.7 cm. Imaging findings are concerning for underlying neoplasm within the head of pancreas. Advise further evaluation with ERCP and endoscopic ultrasound. Electronically Signed   By: Kerby Moors M.D.   On: 05/22/2022 04:31   CT ABDOMEN PELVIS W CONTRAST  Result Date: 05/21/2022 CLINICAL DATA:  Right upper quadrant pain EXAM: CT ABDOMEN AND PELVIS WITH CONTRAST TECHNIQUE: Multidetector CT imaging of the abdomen and pelvis was performed using the standard protocol following bolus administration of intravenous contrast. RADIATION DOSE REDUCTION: This exam was performed according to the departmental dose-optimization program which includes automated exposure control, adjustment of the mA and/or kV according to patient size and/or use of iterative reconstruction technique. CONTRAST:  54m OMNIPAQUE IOHEXOL 350 MG/ML SOLN COMPARISON:  Abdominal sonogram 05/20/2022 and CT AP 08/01/2012 FINDINGS: Lower chest: Atelectasis and ground-glass attenuation noted within the posterior lung bases. No pleural fluid or airspace disease. Subpleural nodule in the anterior right upper lobe measures 2 mm Hepatobiliary: No focal liver lesion identified. Stones noted on  recent gallbladder ultrasound are not CT visible. There is no gallbladder wall thickening or pericholecystic fluid identified. Dilatation of the proximal common bile duct measures up to 1.3 cm. Abrupt termination the dilated common bile duct at the level of the head of pancreas, image 42/7. Pancreas: Unremarkable. No pancreatic ductal dilatation or surrounding inflammatory changes. Spleen: Normal in size without focal abnormality. Adrenals/Urinary Tract: Adrenal glands are unremarkable. Kidneys are normal, without renal calculi, focal lesion, or hydronephrosis. Bladder is unremarkable. Stomach/Bowel: Stomach is normal. The appendix is not confidently identified. No bowel  wall thickening, inflammation, or distension. Vascular/Lymphatic: Aortic atherosclerosis. No aneurysm. No signs abdominopelvic adenopathy. Reproductive: Uterus and bilateral adnexa are unremarkable. Other: No ascites or focal fluid collections. No signs of pneumoperitoneum. Musculoskeletal: No acute or significant osseous findings. IMPRESSION: 1. Dilatation of the proximal common bile duct measures up to 1.3 cm. Abrupt termination the dilated common bile duct at the level of the head of pancreas. There is also main duct dilatation which abruptly terminates at the level of the head of pancreas, proximal to the ampulla. Imaging findings are concerning for underlying obstructing lesion within the head of pancreas. Further evaluation with contrast enhanced MRI/MRCP is advised. 2. Stones noted on recent gallbladder ultrasound are not CT visible. There is no gallbladder wall thickening or pericholecystic fluid identified. 3. 2 mm right upper lobe lung nodule. Nonspecific. See lung cancer screening CT from 04/15/2022 for follow-up recommendations. 4.  Aortic Atherosclerosis (ICD10-I70.0). Electronically Signed   By: Kerby Moors M.D.   On: 05/21/2022 13:50   US Abdomen Limited RUQ (LIVER/GB)  Result Date: 05/20/2022 CLINICAL DATA:  Pain. EXAM:  ULTRASOUND ABDOMEN LIMITED RIGHT UPPER QUADRANT COMPARISON:  05/17/2022 FINDINGS: Gallbladder: Cholelithiasis with multiple stones filling the gallbladder. No gallbladder wall thickening or edema. Murphy's sign is negative. Common bile duct: Diameter: 13 mm, dilated and enlarging since previous study. Liver: No focal lesion identified. Within normal limits in parenchymal echogenicity. Portal vein is patent on color Doppler imaging with normal direction of blood flow towards the liver. Other: None. IMPRESSION: 1. Cholelithiasis without additional changes to suggest acute cholecystitis. 2. Dilated common bile duct at 13 mm, increasing since previous study. Consider MRCP to exclude common duct stone or obstruction. Electronically Signed   By: Lucienne Capers M.D.   On: 05/20/2022 21:58   US Abdomen Limited RUQ (LIVER/GB)  Result Date: 05/17/2022 CLINICAL DATA:  Jaundice EXAM: ULTRASOUND ABDOMEN LIMITED RIGHT UPPER QUADRANT COMPARISON:  Chest CT 04/18/2022 FINDINGS: Gallbladder: Multiple stones in the gallbladder lumen. No gallbladder wall thickening or pericholecystic. Negative sonographic Murphy's sign. Common bile duct: Diameter: Focally prominent at 7.6 mm. Liver: Possible small focal calcification right hepatic lobe. Normal hepatic parenchymal echogenicity. Portal vein is patent on color Doppler imaging with normal direction of blood flow towards the liver. Other: None. IMPRESSION: 1. Cholelithiasis without secondary signs of acute cholecystitis. 2. Common bile duct is mildly prominent at 7.6 mm. Recommend correlation with LFTs. In the setting of abnormal LFTs, recommend correlation with MRI/MRCP. Electronically Signed   By: Lovey Newcomer M.D.   On: 05/17/2022 11:50    Labs:  CBC: Recent Labs    05/21/22 1224 05/22/22 0328 05/23/22 0230 05/24/22 0555  WBC 9.7 10.1 10.5 15.0*  HGB 12.3 12.3 12.0 11.9*  HCT 34.6* 32.9* 34.0* 34.0*  PLT 305 278 305 325    COAGS: Recent Labs    05/20/22 2050   INR 1.1  APTT 35    BMP: Recent Labs    05/21/22 1224 05/22/22 0328 05/23/22 0230 05/24/22 0555  NA 135 134* 134* 134*  K 2.9* 3.1* 3.3* 3.5  CL 100 101 100 101  CO2 '25 23 23 23  '$ GLUCOSE 138* 101* 102* 168*  BUN 5* <5* 8 8  CALCIUM 8.8* 8.9 8.9 9.1  CREATININE 0.60 0.58 0.65 0.76  GFRNONAA >60 >60 >60 >60    LIVER FUNCTION TESTS: Recent Labs    05/21/22 1224 05/22/22 0328 05/23/22 0230 05/24/22 0555  BILITOT 14.4* 13.6* 15.5* 15.3*  AST 273* 272* 257* 244*  ALT 428* 382*  368* 345*  ALKPHOS 430* 389* 458* 497*  PROT 6.9 6.6 7.0 6.7  ALBUMIN 2.9* 2.8* 2.8* 2.7*     Assessment and Plan: Obstructive juadice. Plan for PTC, bili drainage. Labs reviewed. Risks and benefits of PTC with biliary drainage discussed with the patient including, but not limited to bleeding, infection/cholangitis which may lead to sepsis or even death and damage to adjacent structures.  This interventional procedure involves the use of X-rays and because of the nature of the planned procedure, it is possible that we will have prolonged use of X-ray fluoroscopy.  Potential radiation risks to you include (but are not limited to) the following: - A slightly elevated risk for cancer  several years later in life. This risk is typically less than 0.5% percent. This risk is low in comparison to the normal incidence of human cancer, which is 33% for women and 50% for men according to the [employer]. - Radiation induced injury can include skin redness, resembling a rash, tissue breakdown / ulcers and hair loss (which can be temporary or permanent).   The likelihood of either of these occurring depends on the difficulty of the procedure and whether you are sensitive to radiation due to previous procedures, disease, or genetic conditions.   IF your procedure requires a prolonged use of radiation, you will be notified and given written instructions for further action.  It is your  responsibility to monitor the irradiated area for the 2 weeks following the procedure and to notify your physician if you are concerned that you have suffered a radiation induced injury.    All of the patient's questions were answered, patient is agreeable to proceed.  Consent signed and in chart.    Electronically Signed: Ascencion Dike, PA-C 05/24/2022, 4:10 PM   I spent a total of 20 minutes in face to face in clinical consultation, greater than 50% of which was counseling/coordinating care for PTC/bili drain

## 2022-05-24 NOTE — Progress Notes (Signed)
HD#3 Subjective:   Summary: Michelle Williams is a 62 y.o. person living with a history of hypertension, COPD, cervical cancer s/p cryotherapy, anxiety, and possible iron deficiency anemia who presented with 4 weeks of progressive right upper quadrant pain, jaundice, diarrhea, and fatigue and admitted for obstructive jaundice with evidence of a lesion near the head of the pancreas.  Overnight Events: None  Patient okay after ERCP yesterday.  She does have intermittent cough but no difficulty breathing.  She had no increase in her abdominal pain.  Otherwise no new or worsening complaints.  Objective:  Vital signs in last 24 hours: Vitals:   05/23/22 1540 05/23/22 1624 05/23/22 1950 05/24/22 0441  BP: (!) 133/93 130/82 (!) 141/90 (!) 143/87  Pulse: 82 75 71 (!) 53  Resp: '18 16 18 18  '$ Temp:  97.8 F (36.6 C) 98.3 F (36.8 C) 97.8 F (36.6 C)  TempSrc:  Oral Oral Oral  SpO2: 93% 96% 100% 98%   Supplemental O2: Room Air SpO2: 98 % O2 Flow Rate (L/min): 5 L/min   Physical Exam:  Constitutional: Obviously jaundiced but well-appearing female. In no acute distress. Eyes: scleral icterus Cardio:Regular rate and rhythm.  Pulm:Clear to auscultation bilaterally. Normal work of breathing on room air. Abdomen: Soft, non-distended, positive bowel sounds.  Mild to moderate tenderness to palpation in the right upper quadrant MSK: Trace lower extremity edema Skin: Jaundiced.  Warm and dry. Neuro:Alert and oriented x3. No focal deficit noted. Psych:Pleasant mood and affect.  There were no vitals filed for this visit.   Intake/Output Summary (Last 24 hours) at 05/24/2022 7902 Last data filed at 05/23/2022 1506 Gross per 24 hour  Intake 400 ml  Output --  Net 400 ml   Net IO Since Admission: 1,220.33 mL [05/24/22 0627]  Pertinent Labs:    Latest Ref Rng & Units 05/23/2022    2:30 AM 05/22/2022    3:28 AM 05/21/2022   12:24 PM  CBC  WBC 4.0 - 10.5 K/uL 10.5  10.1  9.7   Hemoglobin  12.0 - 15.0 g/dL 12.0  12.3  12.3   Hematocrit 36.0 - 46.0 % 34.0  32.9  34.6   Platelets 150 - 400 K/uL 305  278  305        Latest Ref Rng & Units 05/23/2022    2:30 AM 05/22/2022    3:28 AM 05/21/2022   12:24 PM  CMP  Glucose 70 - 99 mg/dL 102  101  138   BUN 6 - 20 mg/dL 8  <5  5   Creatinine 0.44 - 1.00 mg/dL 0.65  0.58  0.60   Sodium 135 - 145 mmol/L 134  134  135   Potassium 3.5 - 5.1 mmol/L 3.3  3.1  2.9   Chloride 98 - 111 mmol/L 100  101  100   CO2 22 - 32 mmol/L '23  23  25   '$ Calcium 8.9 - 10.3 mg/dL 8.9  8.9  8.8   Total Protein 6.5 - 8.1 g/dL 7.0  6.6  6.9   Total Bilirubin 0.3 - 1.2 mg/dL 15.5  13.6  14.4   Alkaline Phos 38 - 126 U/L 458  389  430   AST 15 - 41 U/L 257  272  273   ALT 0 - 44 U/L 368  382  428     Imaging: DG ERCP  Result Date: 05/23/2022 CLINICAL DATA:  62 year old female with a history of jaundice EXAM: ERCP TECHNIQUE: Multiple  spot images obtained with the fluoroscopic device and submitted for interpretation post-procedure. FLUOROSCOPY: Radiation Exposure Index (as provided by the fluoroscopic device): 4.5 mGy Kerma COMPARISON:  MR 05/21/2022 FINDINGS: Limited intraoperative fluoroscopic spot images during ERCP. Initial image demonstrates endoscope projecting over the upper abdomen. Subsequently there is deployment of the safety wire, however, no infusion of contrast on the submitted images. IMPRESSION: Limited images during ERCP as above. Please refer to the dictated operative report for full details of intraoperative findings and procedure. Electronically Signed   By: Corrie Mckusick D.O.   On: 05/23/2022 15:50   DG C-Arm 1-60 Min-No Report  Result Date: 05/23/2022 Fluoroscopy was utilized by the requesting physician.  No radiographic interpretation.    Assessment/Plan:   Principal Problem:   Obstructive jaundice Active Problems:   Elevated LFTs   Abnormal magnetic resonance cholangiopancreatography (MRCP)   Patient Summary: Michelle Williams is  a 62 y.o. person living with a history of hypertension, COPD, cervical cancer s/p cryotherapy, anxiety, and possible iron deficiency anemia who presented with 4 weeks of progressive right upper quadrant pain, jaundice, diarrhea, and fatigue and admitted for obstructive jaundice with evidence of a lesion near the head of the pancreas    Obstructive jaundice 2/2 a lesion near the head of the pancreas Conjugated hyperbilirubinemia Elevated LFTs/alk phos Overall stable with no large changes in labs.  Ultrasound and CT imaging showing dilation of the extrahepatic biliary system with a lesion near the head of the pancreas.  Was unable to complete MRCP but similar findings were seen.  ERCP was unable to cannulate the ducts and recommendation for repeat ERCP or PCT, will defer to GI. - Hold antibiotics for now, start if she develops fever or leukocytosis - Holding pain medication but can add if pain worsens or becomes constant   Hypokalemia Abnormal EKG Presented with K of 2.9, up to 3.5 today.  EKG here without significant changes from 01/2022 but there is persistent LVH and T wave inversions in the inferior lateral leads. She has a cardiology follow-up this week so we will notify Dr. Irish Lack and see if he would like to get the echo while she is here. - AM BMP   Hypertension Prescribed lisinopril, triamterene/hydrochlorothiazide, and diltiazem.  - Continue lisinopril 10 mg daily - Hold triamterene/hydrochlorothiazide and diltiazem until reliable med rec has been completed   COPD Tobacco use disorder Pulmonary nodule - Continue Singulair 10 mg daily - Albuterol nebulizer as needed - Continue Chantix 0.5 mg twice daily  Diet: NPO IVF: None, VTE: Enoxaparin Code: DNR  Dispo: Anticipated discharge to Home pending further evaluation of obstructive jaundice.  [name], DO Internal Medicine Resident PGY-1 Pager: [phone]  Please contact the on call pager after 5 pm and on weekends  at [phone].

## 2022-05-25 ENCOUNTER — Inpatient Hospital Stay (HOSPITAL_COMMUNITY): Payer: 59

## 2022-05-25 HISTORY — PX: IR BILIARY DRAIN PLACEMENT WITH CHOLANGIOGRAM: IMG6043

## 2022-05-25 LAB — CBC
HCT: 33.3 % — ABNORMAL LOW (ref 36.0–46.0)
Hemoglobin: 11.8 g/dL — ABNORMAL LOW (ref 12.0–15.0)
MCH: 31.1 pg (ref 26.0–34.0)
MCHC: 35.4 g/dL (ref 30.0–36.0)
MCV: 87.6 fL (ref 80.0–100.0)
Platelets: 326 10*3/uL (ref 150–400)
RBC: 3.8 MIL/uL — ABNORMAL LOW (ref 3.87–5.11)
RDW: 18.7 % — ABNORMAL HIGH (ref 11.5–15.5)
WBC: 13.1 10*3/uL — ABNORMAL HIGH (ref 4.0–10.5)
nRBC: 0 % (ref 0.0–0.2)

## 2022-05-25 LAB — COMPREHENSIVE METABOLIC PANEL
ALT: 356 U/L — ABNORMAL HIGH (ref 0–44)
AST: 273 U/L — ABNORMAL HIGH (ref 15–41)
Albumin: 2.7 g/dL — ABNORMAL LOW (ref 3.5–5.0)
Alkaline Phosphatase: 494 U/L — ABNORMAL HIGH (ref 38–126)
Anion gap: 8 (ref 5–15)
BUN: 7 mg/dL (ref 6–20)
CO2: 24 mmol/L (ref 22–32)
Calcium: 8.6 mg/dL — ABNORMAL LOW (ref 8.9–10.3)
Chloride: 103 mmol/L (ref 98–111)
Creatinine, Ser: 0.7 mg/dL (ref 0.44–1.00)
GFR, Estimated: >60 mL/min (ref >60)
Glucose, Bld: 118 mg/dL — ABNORMAL HIGH (ref 70–99)
Potassium: 3.5 mmol/L (ref 3.5–5.1)
Sodium: 135 mmol/L (ref 135–145)
Total Bilirubin: 15 mg/dL — ABNORMAL HIGH (ref 0.3–1.2)
Total Protein: 6.5 g/dL (ref 6.5–8.1)

## 2022-05-25 MED ORDER — LIDOCAINE HCL 1 % IJ SOLN
INTRAMUSCULAR | Status: AC
  Filled 2022-05-25: qty 20

## 2022-05-25 MED ORDER — FENTANYL CITRATE (PF) 100 MCG/2ML IJ SOLN
INTRAMUSCULAR | Status: AC | PRN
  Administered 2022-05-25: 25 ug via INTRAVENOUS
  Administered 2022-05-25: 50 ug via INTRAVENOUS

## 2022-05-25 MED ORDER — SODIUM CHLORIDE 0.9 % IV BOLUS: 500.0000 mL | Freq: Once | INTRAVENOUS | Status: AC

## 2022-05-25 MED ORDER — KETOROLAC TROMETHAMINE 15 MG/ML IJ SOLN
15.0000 mg | Freq: Four times a day (QID) | INTRAMUSCULAR | Status: DC | PRN
  Administered 2022-05-25 – 2022-05-28 (×5): 15 mg via INTRAVENOUS
  Filled 2022-05-25 (×5): qty 1

## 2022-05-25 MED ORDER — FENTANYL CITRATE (PF) 100 MCG/2ML IJ SOLN
INTRAMUSCULAR | Status: AC
  Filled 2022-05-25: qty 4

## 2022-05-25 MED ORDER — SODIUM CHLORIDE 0.9 % IV SOLN
INTRAVENOUS | Status: AC | PRN
  Administered 2022-05-25: 500 mL via INTRAVENOUS

## 2022-05-25 MED ORDER — MEPERIDINE HCL 25 MG/ML IJ SOLN
25.0000 mg | Freq: Once | INTRAMUSCULAR | Status: AC
  Administered 2022-05-25: 25 mg via INTRAVENOUS
  Filled 2022-05-25: qty 1

## 2022-05-25 MED ORDER — ONDANSETRON HCL 4 MG/2ML IJ SOLN
INTRAMUSCULAR | Status: AC
  Filled 2022-05-25: qty 2

## 2022-05-25 MED ORDER — IOHEXOL 300 MG/ML  SOLN
50.0000 mL | Freq: Once | INTRAMUSCULAR | Status: AC | PRN
  Administered 2022-05-25: 25 mL

## 2022-05-25 MED ORDER — ONDANSETRON HCL 4 MG/2ML IJ SOLN
INTRAMUSCULAR | Status: AC | PRN
  Administered 2022-05-25: 4 mg via INTRAVENOUS

## 2022-05-25 MED ORDER — SODIUM CHLORIDE 0.9 % IV SOLN
2.0000 g | INTRAVENOUS | Status: AC
  Administered 2022-05-25: 2 g via INTRAVENOUS
  Filled 2022-05-25: qty 2

## 2022-05-25 MED ORDER — MIDAZOLAM HCL 2 MG/2ML IJ SOLN
INTRAMUSCULAR | Status: AC
  Filled 2022-05-25: qty 4

## 2022-05-25 MED ORDER — MIDAZOLAM HCL 2 MG/2ML IJ SOLN
INTRAMUSCULAR | Status: AC | PRN
  Administered 2022-05-25: 1 mg via INTRAVENOUS
  Administered 2022-05-25: .5 mg via INTRAVENOUS

## 2022-05-25 NOTE — Sedation Documentation (Signed)
Denies nausea at this time.  

## 2022-05-25 NOTE — TOC Progression Note (Signed)
Transition of Care [facility]) - Progression Note    Patient Details  Name: EUGENIA ELDREDGE MRN: [id number] Date of Birth: [birth year]  Transition of Care [facility]) CM/SW [place], [place] Phone Number: 05/25/2022, 12:21 PM  Clinical Narrative:      Transition of Care [facility]) Screening Note   Patient Details  Name: ARNOLD DEPINTO Date of Birth: 23-Mar-1961   Transition of Care [facility]) CM/SW Contact:    Coralee Pesa, [place] Phone Number: 05/25/2022, 12:22 PM    Transition of Care Department [facility]) continues to review patient and no TOC needs have been identified at this time. We will continue to monitor patient advancement through interdisciplinary progression rounds. If new patient transition needs arise, please place a TOC consult.         Expected Discharge Plan and Services                                               Social Determinants of Health (SDOH) Interventions SDOH Screenings   Food Insecurity: No Food Insecurity (05/21/2022)  Transportation Needs: No Transportation Needs (05/21/2022)  Utilities: Not At Risk (05/21/2022)  Tobacco Use: High Risk (05/24/2022)    Readmission Risk Interventions     No data to display

## 2022-05-25 NOTE — Sedation Documentation (Signed)
No shivering at this time. Patient resting comfortable on bed.

## 2022-05-25 NOTE — Progress Notes (Signed)
HD#4 Subjective:   Summary: Michelle Williams is a 62 y.o. person living with a history of hypertension, COPD, cervical cancer s/p cryotherapy, anxiety, and possible iron deficiency anemia who presented with 4 weeks of progressive right upper quadrant pain, jaundice, diarrhea, and fatigue and admitted for obstructive jaundice with evidence of a lesion near the head of the pancreas.  Overnight Events: None  Patient is feeling okay this morning with continued right upper quadrant abdominal pain that has not gotten worse.  No epigastric pain and no nausea, vomiting, or constipation/diarrhea.  Objective:  Vital signs in last 24 hours: Vitals:   05/24/22 0817 05/24/22 1611 05/24/22 2035 05/25/22 0516  BP: (!) 147/84 (!) 166/92 (!) 152/85 (!) 147/92  Pulse: 72 75 80 76  Resp: '18 18 18 18  '$ Temp: 98.4 F (36.9 C) 98.5 F (36.9 C) 99 F (37.2 C) 99 F (37.2 C)  TempSrc: Oral Oral Oral Oral  SpO2: 98% 99% 97% 97%   Supplemental O2: Room Air SpO2: 97 % O2 Flow Rate (L/min): 5 L/min   Physical Exam:  Constitutional: Obviously jaundiced but well-appearing female. In no acute distress. Eyes: scleral icterus Cardio:Regular rate and rhythm.  Pulm:Clear to auscultation bilaterally. Normal work of breathing on room air. Abdomen: Soft, non-distended, positive bowel sounds.  Mild to moderate tenderness to palpation in the right upper quadrant MSK: Trace lower extremity edema Skin: Jaundiced.  Warm and dry. Neuro:Alert and oriented x3. No focal deficit noted. Psych:Pleasant mood and affect.  There were no vitals filed for this visit.   Intake/Output Summary (Last 24 hours) at 05/25/2022 0659 Last data filed at 05/24/2022 2153 Gross per 24 hour  Intake 360 ml  Output --  Net 360 ml   Net IO Since Admission: 1,820.33 mL [05/25/22 0659]  Pertinent Labs:    Latest Ref Rng & Units 05/25/2022    1:16 AM 05/24/2022    5:55 AM 05/23/2022    2:30 AM  CBC  WBC 4.0 - 10.5 K/uL 13.1  15.0   10.5   Hemoglobin 12.0 - 15.0 g/dL 11.8  11.9  12.0   Hematocrit 36.0 - 46.0 % 33.3  34.0  34.0   Platelets 150 - 400 K/uL 326  325  305        Latest Ref Rng & Units 05/25/2022    1:16 AM 05/24/2022    5:55 AM 05/23/2022    2:30 AM  CMP  Glucose 70 - 99 mg/dL 118  168  102   BUN 6 - 20 mg/dL '7  8  8   '$ Creatinine 0.44 - 1.00 mg/dL 0.70  0.76  0.65   Sodium 135 - 145 mmol/L 135  134  134   Potassium 3.5 - 5.1 mmol/L 3.5  3.5  3.3   Chloride 98 - 111 mmol/L 103  101  100   CO2 22 - 32 mmol/L '24  23  23   '$ Calcium 8.9 - 10.3 mg/dL 8.6  9.1  8.9   Total Protein 6.5 - 8.1 g/dL 6.5  6.7  7.0   Total Bilirubin 0.3 - 1.2 mg/dL 15.0  15.3  15.5   Alkaline Phos 38 - 126 U/L 494  497  458   AST 15 - 41 U/L 273  244  257   ALT 0 - 44 U/L 356  345  368     Imaging: No results found.  Assessment/Plan:   Principal Problem:   Obstructive jaundice Active Problems:   Elevated  LFTs   Abnormal magnetic resonance cholangiopancreatography (MRCP)   Dilated cbd, acquired   Patient Summary: Michelle Williams is a 62 y.o. person living with a history of hypertension, COPD, cervical cancer s/p cryotherapy, anxiety, and possible iron deficiency anemia who presented with 4 weeks of progressive right upper quadrant pain, jaundice, diarrhea, and fatigue and admitted for obstructive jaundice with evidence of a lesion near the head of the pancreas.   Obstructive jaundice 2/2 a lesion near the head of the pancreas Conjugated hyperbilirubinemia Elevated LFTs/alk phos Overall stable with no large changes in labs.  Ultrasound and CT imaging showing dilation of the extrahepatic biliary system with a lesion near the head of the pancreas.  Was unable to complete MRCP but similar findings were seen.  ERCP was unable to cannulate the ducts and she will go for PCT today. - Holding pain medication but can add if pain worsens or becomes constant   Hypokalemia, resolved Abnormal EKG EKG here without significant  changes from 01/2022 but there is persistent LVH and T wave inversions in the inferior lateral leads. She has a cardiology follow-up this week so we will notify Dr. Irish Lack and see if he would like to get the echo while she is here. - AM BMP   Hypertension Prescribed lisinopril, triamterene/hydrochlorothiazide, and diltiazem.  - Continue lisinopril 10 mg daily - Hold triamterene/hydrochlorothiazide and diltiazem until reliable med rec has been completed   COPD Tobacco use disorder Pulmonary nodule - Continue Singulair 10 mg daily - Albuterol nebulizer as needed - Continue Chantix 0.5 mg twice daily  Diet: NPO, will start a diet after PCT today IVF: None, VTE: Enoxaparin Code: DNR  Dispo: Anticipated discharge to Home pending further evaluation of obstructive jaundice.  [name], DO Internal Medicine Resident PGY-1 Pager: [phone]  Please contact the on call pager after 5 pm and on weekends at [phone].

## 2022-05-25 NOTE — Procedures (Signed)
Interventional Radiology Procedure Note  Procedure: Image guided drain placement, externalized biliary drain.  32F pigtail drain.  Complications: Rigors  EBL: None Sample: Culture sent  Recommendations: - Currently treating patient for rigors post drain.  - Routine drain care, with sterile flushes, record output.  - VIR to follow.  Will need 24-48 hours of decompression and return for attempt at converting the externalized drain to int/ext drain.  - follow up Cx - routine wound care  Signed,  Dulcy Fanny. Earleen Newport, DO

## 2022-05-25 NOTE — Sedation Documentation (Signed)
Patient states pain right side of abdomen, nausea, and slight shivering.

## 2022-05-26 ENCOUNTER — Encounter (HOSPITAL_COMMUNITY): Payer: Self-pay | Admitting: Internal Medicine

## 2022-05-26 DIAGNOSIS — I10 Essential (primary) hypertension: Secondary | ICD-10-CM

## 2022-05-26 DIAGNOSIS — R7989 Other specified abnormal findings of blood chemistry: Secondary | ICD-10-CM | POA: Diagnosis not present

## 2022-05-26 DIAGNOSIS — F17210 Nicotine dependence, cigarettes, uncomplicated: Secondary | ICD-10-CM

## 2022-05-26 DIAGNOSIS — K831 Obstruction of bile duct: Secondary | ICD-10-CM | POA: Diagnosis not present

## 2022-05-26 LAB — COMPREHENSIVE METABOLIC PANEL
ALT: 336 U/L — ABNORMAL HIGH (ref 0–44)
AST: 201 U/L — ABNORMAL HIGH (ref 15–41)
Albumin: 2.7 g/dL — ABNORMAL LOW (ref 3.5–5.0)
Alkaline Phosphatase: 483 U/L — ABNORMAL HIGH (ref 38–126)
Anion gap: 8 (ref 5–15)
BUN: 9 mg/dL (ref 6–20)
CO2: 23 mmol/L (ref 22–32)
Calcium: 8.6 mg/dL — ABNORMAL LOW (ref 8.9–10.3)
Chloride: 103 mmol/L (ref 98–111)
Creatinine, Ser: 0.66 mg/dL (ref 0.44–1.00)
GFR, Estimated: >60 mL/min (ref >60)
Glucose, Bld: 123 mg/dL — ABNORMAL HIGH (ref 70–99)
Potassium: 3.9 mmol/L (ref 3.5–5.1)
Sodium: 134 mmol/L — ABNORMAL LOW (ref 135–145)
Total Bilirubin: 9.2 mg/dL — ABNORMAL HIGH (ref 0.3–1.2)
Total Protein: 6.8 g/dL (ref 6.5–8.1)

## 2022-05-26 LAB — CBC
HCT: 34 % — ABNORMAL LOW (ref 36.0–46.0)
Hemoglobin: 11.8 g/dL — ABNORMAL LOW (ref 12.0–15.0)
MCH: 31.7 pg (ref 26.0–34.0)
MCHC: 34.7 g/dL (ref 30.0–36.0)
MCV: 91.4 fL (ref 80.0–100.0)
Platelets: 332 10*3/uL (ref 150–400)
RBC: 3.72 MIL/uL — ABNORMAL LOW (ref 3.87–5.11)
RDW: 18.9 % — ABNORMAL HIGH (ref 11.5–15.5)
WBC: 10.5 10*3/uL (ref 4.0–10.5)
nRBC: 0 % (ref 0.0–0.2)

## 2022-05-26 LAB — CANCER ANTIGEN 19-9: CA 19-9: 158 U/mL — ABNORMAL HIGH (ref 0–35)

## 2022-05-26 MED ORDER — VARENICLINE TARTRATE 1 MG PO TABS: 1.0000 mg | ORAL_TABLET | Freq: Two times a day (BID) | ORAL | Status: DC

## 2022-05-26 MED ORDER — SODIUM CHLORIDE 0.9% FLUSH
5.0000 mL | Freq: Three times a day (TID) | INTRAVENOUS | Status: DC
  Administered 2022-05-26 – 2022-05-28 (×6): 5 mL

## 2022-05-26 MED ORDER — VARENICLINE TARTRATE 0.5 MG PO TABS
0.5000 mg | ORAL_TABLET | Freq: Two times a day (BID) | ORAL | Status: DC
  Administered 2022-05-26 – 2022-05-27 (×2): 0.5 mg via ORAL
  Filled 2022-05-26 (×5): qty 1

## 2022-05-26 NOTE — Progress Notes (Signed)
HD#5 Subjective:   Summary: Michelle Williams is a 62 y.o. person living with a history of hypertension, COPD, cervical cancer s/p cryotherapy, anxiety, and possible iron deficiency anemia who presented with 4 weeks of progressive right upper quadrant pain, jaundice, diarrhea, and fatigue and admitted for obstructive jaundice with evidence of a lesion near the head of the pancreas.  Overnight Events: None  Patient is feeling better this morning after undergoing percutaneous transhepatic cholangiogram with drain placement yesterday.  She does have some postop pain but notes that her abdomen is less distended and that her right upper quadrant pain is much improved.  She denies any new or worsening issues.  Objective:  Vital signs in last 24 hours: Vitals:   05/25/22 1728 05/25/22 2102 05/25/22 2325 05/26/22 0530  BP: (!) 155/102 (!) 149/90 122/79 (!) 145/90  Pulse: 71 77 (!) 58 63  Resp: '19 18 18 16  '$ Temp: 98.4 F (36.9 C) 98.9 F (37.2 C) 98.6 F (37 C) 97.9 F (36.6 C)  TempSrc: Oral Oral Oral Oral  SpO2: 94% 96% 97% 99%   Supplemental O2: Room Air SpO2: 99 % O2 Flow Rate (L/min): 2 L/min   Physical Exam:  Constitutional: Obviously jaundiced but well-appearing female. In no acute distress. Eyes: scleral icterus Cardio:Regular rate and rhythm.  Pulm:Clear to auscultation bilaterally. Normal work of breathing on room air. Abdomen: Soft, non-distended, positive bowel sounds.  Drain in place in right upper quadrant without signs of leaking or bleeding around it. MSK: Trace lower extremity edema Skin: Jaundiced.  Warm and dry. Neuro:Alert and oriented x3. No focal deficit noted. Psych:Pleasant mood and affect.  There were no vitals filed for this visit.   Intake/Output Summary (Last 24 hours) at 05/26/2022 0628 Last data filed at 05/26/2022 0200 Gross per 24 hour  Intake 480 ml  Output --  Net 480 ml   Net IO Since Admission: 2,300.33 mL [05/26/22 0628]  Pertinent  Labs:    Latest Ref Rng & Units 05/26/2022    1:02 AM 05/25/2022    1:16 AM 05/24/2022    5:55 AM  CBC  WBC 4.0 - 10.5 K/uL 10.5  13.1  15.0   Hemoglobin 12.0 - 15.0 g/dL 11.8  11.8  11.9   Hematocrit 36.0 - 46.0 % 34.0  33.3  34.0   Platelets 150 - 400 K/uL 332  326  325        Latest Ref Rng & Units 05/26/2022    1:02 AM 05/25/2022    1:16 AM 05/24/2022    5:55 AM  CMP  Glucose 70 - 99 mg/dL 123  118  168   BUN 6 - 20 mg/dL '9  7  8   '$ Creatinine 0.44 - 1.00 mg/dL 0.66  0.70  0.76   Sodium 135 - 145 mmol/L 134  135  134   Potassium 3.5 - 5.1 mmol/L 3.9  3.5  3.5   Chloride 98 - 111 mmol/L 103  103  101   CO2 22 - 32 mmol/L '23  24  23   '$ Calcium 8.9 - 10.3 mg/dL 8.6  8.6  9.1   Total Protein 6.5 - 8.1 g/dL 6.8  6.5  6.7   Total Bilirubin 0.3 - 1.2 mg/dL 9.2  15.0  15.3   Alkaline Phos 38 - 126 U/L 483  494  497   AST 15 - 41 U/L 201  273  244   ALT 0 - 44 U/L 336  356  345     Imaging: IR BILIARY DRAIN PLACEMENT WITH CHOLANGIOGRAM  Result Date: 05/25/2022 INDICATION: 62 year old female referred for PTC and drainage EXAM: IMAGE GUIDED PERCUTANEOUS TRANS HEPATIC CHOLANGIOGRAM AND EXTERNAL DRAINAGE MEDICATIONS: 2 g cefoxitin; The antibiotic was administered within an appropriate time frame prior to the initiation of the procedure. 4 mg Zofran, 25 mg IV Demerol ANESTHESIA/SEDATION: Moderate (conscious) sedation was employed during this procedure. A total of Versed 1.5 mg and Fentanyl 75 mcg was administered intravenously by the radiology nurse. Total intra-service moderate Sedation Time: 35 minutes. The patient's level of consciousness and vital signs were monitored continuously by radiology nursing throughout the procedure under my direct supervision. FLUOROSCOPY: Radiation Exposure Index (as provided by the fluoroscopic device): 57 mGy Kerma COMPLICATIONS: SIR LEVEL B - Normal therapy, includes overnight admission for observation. Rigors PROCEDURE: The procedure, risks, benefits, and  alternatives were explained to the patient and the patient's family. A complete informed consent was performed, with risk benefit analysis. Specific risks that were discussed for the procedure include bleeding, infection, biliary sepsis, IC use day, organ injury, need for further procedure, need for further surgery, long-term drain placement, cardiopulmonary collapse, death. Questions regarding the procedure were encouraged and answered. The patient understands and consents to the procedure. Patient is position in supine position on the fluoroscopy table, and the upper abdomen was prepped and draped in the usual sterile fashion. Maximum barrier sterile technique with sterile gowns and gloves were used for the procedure. A timeout was performed prior to the initiation of the procedure. Local anesthesia was provided with 1% lidocaine with epinephrine. Ultrasound survey of the right liver lobe was performed. Window into the right biliary system was only present for the right liver approach. 1% lidocaine was used for local anesthesia, with generous infiltration of the skin and subcutaneous tissues in and inter left costal location. A Chiba needle was advanced under ultrasound guidance into the right liver lobe. Once the tip of the needle was confirmed within the biliary system by injecting small aliquots of contrast, images were stored of the biliary system after partially opacifying the biliary tree via the needle. 018 wire was advanced centrally. The needle was removed, a small incision was made with an 11 blade scalpel, and then a triaxial Accustick system was advanced into the biliary system. The metal stiffener and dilator were removed, we confirmed placement with contrast infusion. Attempt was made to cross the obstruction of the common bile duct. Combination of standard stiff Glidewire, roadrunner wire, with coaxial 4 French glide cath were used. Unsuccessful in navigating the occlusion. The patient was beginning  to experience rigors, and so we decided to withdraw at this time. 035 guidewire was advanced and the Accustick was removed. Dilation of the subcutaneous tissue tracks was performed with an 10 [place] dilator, and then a 10 [place] multipurpose drain was placed as an externalized biliary drain. Small amount of contrast confirmed location. Drain was sutured in position and attached to gravity. Sample was sent for culture The patient tolerated the procedure well and remained hemodynamically stable throughout. No complications were encountered and no significant blood loss was encountered. IMPRESSION: Status post image guided percutaneous transhepatic cholangiogram with placement of an externalized biliary stent. Signed, Dulcy Fanny. Dellia Nims, ABVM, RPVI [facility] Inter[facility] Radiology [facility] Radiology PLAN: 24-48 hours of decompression will be required and then another attempt at an internal/external biliary drain. Electronically Signed   By: Corrie Mckusick D.O.   On: 05/25/2022 17:15    Assessment/Plan:  Principal Problem:   Obstructive jaundice Active Problems:   Elevated LFTs   Abnormal magnetic resonance cholangiopancreatography (MRCP)   Dilated cbd, acquired   Patient Summary: Michelle Williams is a 62 y.o. person living with a history of hypertension, COPD, cervical cancer s/p cryotherapy, anxiety, and possible iron deficiency anemia who presented with 4 weeks of progressive right upper quadrant pain, jaundice, diarrhea, and fatigue and admitted for obstructive jaundice with evidence of a lesion near the head of the pancreas.   Obstructive jaundice 2/2 a lesion near the head of the pancreas Conjugated hyperbilirubinemia Elevated LFTs/alk phos S/p PCT yesterday with drain placement.  Overall improvement in labs with bilirubin down from 15-9.  Drain output of 300 mL of bile.  Plan from IR to reattempt procedure after giving time for decompression in 1 to 2 days. - Holding pain  medication but can add if pain worsens or becomes constant   Hypokalemia, resolved Abnormal EKG EKG here without significant changes from 01/2022 but there is persistent LVH and T wave inversions in the inferior lateral leads. She has a cardiology follow-up this week so we will notify Dr. Irish Lack and see if he would like to get the echo while she is here. - AM BMP   Hypertension Prescribed lisinopril, triamterene/hydrochlorothiazide, and diltiazem.  - Continue lisinopril 10 mg daily - Hold triamterene/hydrochlorothiazide and diltiazem until reliable med rec has been completed   COPD Tobacco use disorder Pulmonary nodule - Continue Singulair 10 mg daily - Albuterol nebulizer as needed - Continue Chantix 0.5 mg twice daily  Diet: NPO, will start a diet after PCT today IVF: None, VTE: Enoxaparin Code: DNR  Dispo: Anticipated discharge to Home pending further evaluation of obstructive jaundice.  [name], DO Internal Medicine Resident PGY-1 Pager: [phone]  Please contact the on call pager after 5 pm and on weekends at [phone].

## 2022-05-26 NOTE — Progress Notes (Signed)
Reached out by the GI team after seeing the patient for possible brush biopsy while attempting bili drain internalization tomorrow.   Case approved for Dr. Earleen Newport, will discuss brush biopsy and obtain a new consent that indicated brush biopsy when patient is in IR tomorrow.   Please call IR for questions and concerns.   Armando Gang Tamica Covell PA-C 05/26/2022 4:12 PM

## 2022-05-26 NOTE — Progress Notes (Addendum)
[facility] Progress Note  CC: Obstructive jaundice  Subjective:  Feels great.  Says that she is not having any abdominal pain, headache, etc.  She ate pizza earlier today.  Having good output from her drain.  Objective:  Vital signs in last 24 hours: Temp:  [97.9 F (36.6 C)-98.9 F (37.2 C)] 98.1 F (36.7 C) (01/25 1154) Pulse Rate:  [58-82] 63 (01/25 1154) Resp:  [16-22] 17 (01/25 1154) BP: (122-184)/(79-127) 143/90 (01/25 1154) SpO2:  [93 %-100 %] 98 % (01/25 1154) Last BM Date : 05/25/22 General:  Alert, Well-developed, in NAD Heart:  Regular rate and rhythm; no murmurs Pulm:  CTAB.  No W/R/R. Abdomen:  Soft, non-distended.  BS present.  Non-tender.  Drain output dark colored in bag. Extremities:  Without edema. Neurologic:  Alert and oriented x  4;  grossly normal neurologically. Psych:  Alert and cooperative. Normal mood and affect.  Intake/Output from previous day: 01/24 0701 - 01/25 0700 In: 660 [P.O.:660] Out: 200 [Drains:200] Intake/Output this shift: Total I/O In: -  Out: 100 [Drains:100]  Lab Results: Recent Labs    05/24/22 0555 05/25/22 0116 05/26/22 0102  WBC 15.0* 13.1* 10.5  HGB 11.9* 11.8* 11.8*  HCT 34.0* 33.3* 34.0*  PLT 325 326 332   BMET Recent Labs    05/24/22 0555 05/25/22 0116 05/26/22 0102  NA 134* 135 134*  K 3.5 3.5 3.9  CL 101 103 103  CO2 '23 24 23  '$ GLUCOSE 168* 118* 123*  BUN '8 7 9  '$ CREATININE 0.76 0.70 0.66  CALCIUM 9.1 8.6* 8.6*   LFT Recent Labs    05/26/22 0102  PROT 6.8  ALBUMIN 2.7*  AST 201*  ALT 336*  ALKPHOS 483*  BILITOT 9.2*   IR BILIARY DRAIN PLACEMENT WITH CHOLANGIOGRAM  Result Date: 05/25/2022 INDICATION: 62 year old female referred for PTC and drainage EXAM: IMAGE GUIDED PERCUTANEOUS TRANS HEPATIC CHOLANGIOGRAM AND EXTERNAL DRAINAGE MEDICATIONS: 2 g cefoxitin; The antibiotic was administered within an appropriate time frame prior to the initiation of the procedure. 4 mg Zofran,  25 mg IV Demerol ANESTHESIA/SEDATION: Moderate (conscious) sedation was employed during this procedure. A total of Versed 1.5 mg and Fentanyl 75 mcg was administered intravenously by the radiology nurse. Total intra-service moderate Sedation Time: 35 minutes. The patient's level of consciousness and vital signs were monitored continuously by radiology nursing throughout the procedure under my direct supervision. FLUOROSCOPY: Radiation Exposure Index (as provided by the fluoroscopic device): 57 mGy Kerma COMPLICATIONS: SIR LEVEL B - Normal therapy, includes overnight admission for observation. Rigors PROCEDURE: The procedure, risks, benefits, and alternatives were explained to the patient and the patient's family. A complete informed consent was performed, with risk benefit analysis. Specific risks that were discussed for the procedure include bleeding, infection, biliary sepsis, IC use day, organ injury, need for further procedure, need for further surgery, long-term drain placement, cardiopulmonary collapse, death. Questions regarding the procedure were encouraged and answered. The patient understands and consents to the procedure. Patient is position in supine position on the fluoroscopy table, and the upper abdomen was prepped and draped in the usual sterile fashion. Maximum barrier sterile technique with sterile gowns and gloves were used for the procedure. A timeout was performed prior to the initiation of the procedure. Local anesthesia was provided with 1% lidocaine with epinephrine. Ultrasound survey of the right liver lobe was performed. Window into the right biliary system was only present for the right liver approach. 1% lidocaine was used for local anesthesia,  with generous infiltration of the skin and subcutaneous tissues in and inter left costal location. A Chiba needle was advanced under ultrasound guidance into the right liver lobe. Once the tip of the needle was confirmed within the biliary system  by injecting small aliquots of contrast, images were stored of the biliary system after partially opacifying the biliary tree via the needle. 018 wire was advanced centrally. The needle was removed, a small incision was made with an 11 blade scalpel, and then a triaxial Accustick system was advanced into the biliary system. The metal stiffener and dilator were removed, we confirmed placement with contrast infusion. Attempt was made to cross the obstruction of the common bile duct. Combination of standard stiff Glidewire, roadrunner wire, with coaxial 4 French glide cath were used. Unsuccessful in navigating the occlusion. The patient was beginning to experience rigors, and so we decided to withdraw at this time. 035 guidewire was advanced and the Accustick was removed. Dilation of the subcutaneous tissue tracks was performed with an 10 [place] dilator, and then a 10 [place] multipurpose drain was placed as an externalized biliary drain. Small amount of contrast confirmed location. Drain was sutured in position and attached to gravity. Sample was sent for culture The patient tolerated the procedure well and remained hemodynamically stable throughout. No complications were encountered and no significant blood loss was encountered. IMPRESSION: Status post image guided percutaneous transhepatic cholangiogram with placement of an externalized biliary stent. Signed, Dulcy Fanny. Dellia Nims, ABVM, RPVI [facility] Interventional Radiology Special[facility] Radiology PLAN: 24-48 hours of decompression will be required and then another attempt at an internal/external biliary drain. Electronically Signed   By: Corrie Mckusick D.O.   On: 05/25/2022 17:15    Assessment / Plan: 62 year old female with obstructive jaundice, right upper quadrant abdominal pain, dilation of proximal common bile duct to 1.3 cm, abrupt termination level of head of pancreas proximal to the ampulla Unsuccessful cannulation yesterday during an  attempted ERCP Negative for ampullary lesion based on ERCP   Patient underwent PTC with IR on 1/26.  External drain was placed.  Will attempt to convert to internal/external biliary drain after 24 to 48 hours of decompression.  Does not appear that brushings were obtained.  Question if this could be done during the converting step?  Total bili trending down to 9.2 today from 15 yesterday.  Other LFTs still remain elevated, but stable.   LOS: 5 days   Michelle Williams  05/26/2022, 2:34 PM     Attending physician's note   I have taken a history, reviewed the chart and examined the patient. I performed a substantive portion of this encounter, including complete performance of at least one of the key components, in conjunction with the APP. I agree with the APP's note, impression and recommendations.    S/p PTC with biliary drain LFT improving, patient feels better with no abdominal pain Plan for drain internalization tomorrow and brushings/biopsy by IR  The patient was provided an opportunity to ask questions and all were answered. The patient agreed with the plan and demonstrated an understanding of the instructions.   Damaris Hippo , MD [phone]

## 2022-05-26 NOTE — Progress Notes (Addendum)
Referring Physician(s): Harl Bowie   Supervising Physician: Sandi Mariscal  Patient Status:  [facility] - In-pt  Chief Complaint:  Jaundice and hyperbilirubinemia, dilated CBD and abrupt termination at the level of the head of the pancreas  Attempted ERCP on 1/22  S/p external biliary drain placement by Dr. Earleen Newport on 1/24. Procedure complicated by development of rigors - resolved after 25 mg demerol   Subjective:  Patient laying in bed, states that she is abdominal pain free for the first time in 4 weeks.  Denies nausea or vomiting, states that she was able to eat pizza today.  Discussed internalization of the bili drain either tomorrow or next week, she understands the reason and agrees to proceed.    Allergies: Patient has no known allergies.  Medications: Prior to Admission medications   Medication Sig Start Date End Date Taking? Authorizing Provider  albuterol (VENTOLIN HFA) 108 (90 Base) MCG/ACT inhaler Inhale into the lungs. 02/25/22  Yes [provider]  cyclobenzaprine (FLEXERIL) 10 MG tablet Take 10 mg by mouth at bedtime. 12/12/16  Yes [provider]  fluticasone (FLONASE) 50 MCG/ACT nasal spray Place 1 spray into both nostrils daily. Patient taking differently: Place 1 spray into both nostrils daily as needed for allergies. 02/19/22  Yes Nanavati, Ankit, MD  levocetirizine (XYZAL) 5 MG tablet Take 5 mg by mouth at bedtime.   Yes [provider]  lisinopril (ZESTRIL) 10 MG tablet Take 1 tablet (10 mg total) by mouth daily. 05/06/22  Yes Jettie Booze, MD  montelukast (SINGULAIR) 10 MG tablet Take 10 mg by mouth at bedtime.   Yes [provider]  traZODone (DESYREL) 100 MG tablet Take 100 mg by mouth at bedtime. 03/28/22  Yes [provider]  Varenicline Tartrate, Starter, (CHANTIX STARTING MONTH PAK) 0.5 MG X 11 & 1 MG X 42 TBPK Days 1 to 3: 0.5 mg once daily. Days 4 to 7: 0.5 mg twice daily. Maintenance (day 8 and  later): 1 mg twice daily Patient taking differently: Take 0.5 mg by mouth 2 (two) times daily. 05/06/22  Yes Jettie Booze, MD     Vital Signs: BP (!) 143/90 (BP Location: Right Arm)   Pulse 63   Temp 98.1 F (36.7 C) (Oral)   Resp 17   SpO2 98%   Physical Exam Vitals reviewed.  Constitutional:      General: She is not in acute distress.    Appearance: She is not ill-appearing.  HENT:     Head: Normocephalic.     Mouth/Throat:     Mouth: Mucous membranes are moist.     Pharynx: Oropharynx is clear.  Eyes:     General: Scleral icterus present.  Cardiovascular:     Rate and Rhythm: Normal rate and regular rhythm.     Heart sounds: Normal heart sounds.  Pulmonary:     Effort: Pulmonary effort is normal.     Breath sounds: Normal breath sounds.  Abdominal:     General: Abdomen is flat. Bowel sounds are normal.     Palpations: Abdomen is soft.     Tenderness: There is no abdominal tenderness.  Skin:    General: Skin is warm and dry.     Coloration: Skin is jaundiced.  Neurological:     Mental Status: She is alert and oriented to person, place, and time.  Psychiatric:        Mood and Affect: Mood normal.        Behavior: Behavior  normal.     Imaging: IR BILIARY DRAIN PLACEMENT WITH CHOLANGIOGRAM  Result Date: 05/25/2022 INDICATION: 62 year old female referred for PTC and drainage EXAM: IMAGE GUIDED PERCUTANEOUS TRANS HEPATIC CHOLANGIOGRAM AND EXTERNAL DRAINAGE MEDICATIONS: 2 g cefoxitin; The antibiotic was administered within an appropriate time frame prior to the initiation of the procedure. 4 mg Zofran, 25 mg IV Demerol ANESTHESIA/SEDATION: Moderate (conscious) sedation was employed during this procedure. A total of Versed 1.5 mg and Fentanyl 75 mcg was administered intravenously by the radiology nurse. Total intra-service moderate Sedation Time: 35 minutes. The patient's level of consciousness and vital signs were monitored continuously by radiology nursing  throughout the procedure under my direct supervision. FLUOROSCOPY: Radiation Exposure Index (as provided by the fluoroscopic device): 57 mGy Kerma COMPLICATIONS: SIR LEVEL B - Normal therapy, includes overnight admission for observation. Rigors PROCEDURE: The procedure, risks, benefits, and alternatives were explained to the patient and the patient's family. A complete informed consent was performed, with risk benefit analysis. Specific risks that were discussed for the procedure include bleeding, infection, biliary sepsis, IC use day, organ injury, need for further procedure, need for further surgery, long-term drain placement, cardiopulmonary collapse, death. Questions regarding the procedure were encouraged and answered. The patient understands and consents to the procedure. Patient is position in supine position on the fluoroscopy table, and the upper abdomen was prepped and draped in the usual sterile fashion. Maximum barrier sterile technique with sterile gowns and gloves were used for the procedure. A timeout was performed prior to the initiation of the procedure. Local anesthesia was provided with 1% lidocaine with epinephrine. Ultrasound survey of the right liver lobe was performed. Window into the right biliary system was only present for the right liver approach. 1% lidocaine was used for local anesthesia, with generous infiltration of the skin and subcutaneous tissues in and inter left costal location. A Chiba needle was advanced under ultrasound guidance into the right liver lobe. Once the tip of the needle was confirmed within the biliary system by injecting small aliquots of contrast, images were stored of the biliary system after partially opacifying the biliary tree via the needle. 018 wire was advanced centrally. The needle was removed, a small incision was made with an 11 blade scalpel, and then a triaxial Accustick system was advanced into the biliary system. The metal stiffener and dilator were  removed, we confirmed placement with contrast infusion. Attempt was made to cross the obstruction of the common bile duct. Combination of standard stiff Glidewire, roadrunner wire, with coaxial 4 French glide cath were used. Unsuccessful in navigating the occlusion. The patient was beginning to experience rigors, and so we decided to withdraw at this time. 035 guidewire was advanced and the Accustick was removed. Dilation of the subcutaneous tissue tracks was performed with an 10 [place] dilator, and then a 10 [place] multipurpose drain was placed as an externalized biliary drain. Small amount of contrast confirmed location. Drain was sutured in position and attached to gravity. Sample was sent for culture The patient tolerated the procedure well and remained hemodynamically stable throughout. No complications were encountered and no significant blood loss was encountered. IMPRESSION: Status post image guided percutaneous transhepatic cholangiogram with placement of an externalized biliary stent. Signed, Dulcy Fanny. Dellia Nims, ABVM, RPVI [facility] Radiology PLAN: 24-48 hours of decompression will be required and then another attempt at an internal/external biliary drain. Electronically Signed   By: Corrie Mckusick D.O.   On: 05/25/2022 17:15   DG ERCP  Result Date: 05/23/2022 CLINICAL DATA:  62 year old female with a history of jaundice EXAM: ERCP TECHNIQUE: Multiple spot images obtained with the fluoroscopic device and submitted for interpretation post-procedure. FLUOROSCOPY: Radiation Exposure Index (as provided by the fluoroscopic device): 4.5 mGy Kerma COMPARISON:  MR 05/21/2022 FINDINGS: Limited intraoperative fluoroscopic spot images during ERCP. Initial image demonstrates endoscope projecting over the upper abdomen. Subsequently there is deployment of the safety wire, however, no infusion of contrast on the submitted images. IMPRESSION: Limited images during  ERCP as above. Please refer to the dictated operative report for full details of intraoperative findings and procedure. Electronically Signed   By: Corrie Mckusick D.O.   On: 05/23/2022 15:50   DG C-Arm 1-60 Min-No Report  Result Date: 05/23/2022 Fluoroscopy was utilized by the requesting physician.  No radiographic interpretation.    Labs:  CBC: Recent Labs    05/23/22 0230 05/24/22 0555 05/25/22 0116 05/26/22 0102  WBC 10.5 15.0* 13.1* 10.5  HGB 12.0 11.9* 11.8* 11.8*  HCT 34.0* 34.0* 33.3* 34.0*  PLT 305 325 326 332    COAGS: Recent Labs    05/20/22 2050  INR 1.1  APTT 35    BMP: Recent Labs    05/23/22 0230 05/24/22 0555 05/25/22 0116 05/26/22 0102  NA 134* 134* 135 134*  K 3.3* 3.5 3.5 3.9  CL 100 101 103 103  CO2 '23 23 24 23  '$ GLUCOSE 102* 168* 118* 123*  BUN '8 8 7 9  '$ CALCIUM 8.9 9.1 8.6* 8.6*  CREATININE 0.65 0.76 0.70 0.66  GFRNONAA >60 >60 >60 >60    LIVER FUNCTION TESTS: Recent Labs    05/23/22 0230 05/24/22 0555 05/25/22 0116 05/26/22 0102  BILITOT 15.5* 15.3* 15.0* 9.2*  AST 257* 244* 273* 201*  ALT 368* 345* 356* 336*  ALKPHOS 458* 497* 494* 483*  PROT 7.0 6.7 6.5 6.8  ALBUMIN 2.8* 2.7* 2.7* 2.7*    Assessment and Plan:  62 y.o. female with jaundice due to obstructing pancreatic head lesion, s/p attempted ERCP, s/p ext bili drain placement by Dr. Earleen Newport on 05/25/22. She is in need of internal bili drain.   T bili trending down  9.2 (15.0 yesterday), AST/ ALT/alk phos all trending down  WBC trending down, wnl today 10.5 (13.1) CA 19-9 elevated, 158  Cs from bile pending  VSS Not on AC/AP, INR 1.1 on 05/20/22   Drain output 200 mL overnight, appears bile today. Flushes well.   Risks and benefits of internalization of biliary drain discussed with the patient including, but not limited to bleeding, infection which may lead to sepsis and damage to adjacent structures.  All of the patient's questions were answered, patient is agreeable to  proceed.  Consent signed and in chart.  The procedure is tentatively scheduled for tomorrow pending IR schedule.   PLAN - NPO at [facility] - No AC/AP - 2 g ceftriaxone to be given during the procedure, ordered under pre-procedure phase   Further treatment plan per TRH/ GI Appreciate and agree with the plan.  IR to follow.    Electronically Signed: Tera Mater, PA-C 05/26/2022, 1:30 PM   I spent a total of 25 Minutes at the the patient's bedside AND on the patient's hospital floor or unit, greater than 50% of which was counseling/coordinating care for obstructing pancreatic mass, ext bil drain f/u.  This chart was dictated using voice recognition software.  Despite best efforts to proofread,  errors can occur which can change the documentation meaning.

## 2022-05-27 ENCOUNTER — Inpatient Hospital Stay (HOSPITAL_COMMUNITY): Payer: 59

## 2022-05-27 DIAGNOSIS — R7989 Other specified abnormal findings of blood chemistry: Secondary | ICD-10-CM | POA: Diagnosis not present

## 2022-05-27 DIAGNOSIS — K831 Obstruction of bile duct: Secondary | ICD-10-CM | POA: Diagnosis not present

## 2022-05-27 HISTORY — PX: IR INT EXT BILIARY DRAIN WITH CHOLANGIOGRAM: IMG6044

## 2022-05-27 LAB — CBC
HCT: 33.9 % — ABNORMAL LOW (ref 36.0–46.0)
Hemoglobin: 11.5 g/dL — ABNORMAL LOW (ref 12.0–15.0)
MCH: 31.1 pg (ref 26.0–34.0)
MCHC: 33.9 g/dL (ref 30.0–36.0)
MCV: 91.6 fL (ref 80.0–100.0)
Platelets: 352 10*3/uL (ref 150–400)
RBC: 3.7 MIL/uL — ABNORMAL LOW (ref 3.87–5.11)
RDW: 17.8 % — ABNORMAL HIGH (ref 11.5–15.5)
WBC: 9 10*3/uL (ref 4.0–10.5)
nRBC: 0 % (ref 0.0–0.2)

## 2022-05-27 LAB — COMPREHENSIVE METABOLIC PANEL
ALT: 257 U/L — ABNORMAL HIGH (ref 0–44)
AST: 105 U/L — ABNORMAL HIGH (ref 15–41)
Albumin: 2.8 g/dL — ABNORMAL LOW (ref 3.5–5.0)
Alkaline Phosphatase: 419 U/L — ABNORMAL HIGH (ref 38–126)
Anion gap: 12 (ref 5–15)
BUN: 11 mg/dL (ref 6–20)
CO2: 20 mmol/L — ABNORMAL LOW (ref 22–32)
Calcium: 9 mg/dL (ref 8.9–10.3)
Chloride: 102 mmol/L (ref 98–111)
Creatinine, Ser: 0.59 mg/dL (ref 0.44–1.00)
GFR, Estimated: >60 mL/min (ref >60)
Glucose, Bld: 103 mg/dL — ABNORMAL HIGH (ref 70–99)
Potassium: 3.7 mmol/L (ref 3.5–5.1)
Sodium: 134 mmol/L — ABNORMAL LOW (ref 135–145)
Total Bilirubin: 7.2 mg/dL — ABNORMAL HIGH (ref 0.3–1.2)
Total Protein: 6.7 g/dL (ref 6.5–8.1)

## 2022-05-27 MED ORDER — ONDANSETRON 4 MG PO TBDP: 4.0000 mg | ORAL_TABLET | Freq: Three times a day (TID) | ORAL | Status: DC | PRN

## 2022-05-27 MED ORDER — FENTANYL CITRATE (PF) 100 MCG/2ML IJ SOLN
INTRAMUSCULAR | Status: AC
  Filled 2022-05-27: qty 4

## 2022-05-27 MED ORDER — FENTANYL CITRATE (PF) 100 MCG/2ML IJ SOLN
INTRAMUSCULAR | Status: AC | PRN
  Administered 2022-05-27 (×2): 50 ug via INTRAVENOUS

## 2022-05-27 MED ORDER — HYDRALAZINE HCL 20 MG/ML IJ SOLN
INTRAMUSCULAR | Status: AC | PRN
  Administered 2022-05-27: 10 mg via INTRAVENOUS

## 2022-05-27 MED ORDER — MIDAZOLAM HCL 2 MG/2ML IJ SOLN
INTRAMUSCULAR | Status: AC
  Filled 2022-05-27: qty 4

## 2022-05-27 MED ORDER — ENOXAPARIN SODIUM 40 MG/0.4ML IJ SOSY
40.0000 mg | PREFILLED_SYRINGE | INTRAMUSCULAR | Status: DC
  Filled 2022-05-27: qty 0.4

## 2022-05-27 MED ORDER — SODIUM CHLORIDE 0.9 % IV SOLN
INTRAVENOUS | Status: AC
  Filled 2022-05-27: qty 20

## 2022-05-27 MED ORDER — METOCLOPRAMIDE HCL 5 MG/ML IJ SOLN
10.0000 mg | Freq: Four times a day (QID) | INTRAMUSCULAR | Status: DC | PRN
  Administered 2022-05-27 – 2022-05-28 (×3): 10 mg via INTRAVENOUS
  Filled 2022-05-27 (×3): qty 2

## 2022-05-27 MED ORDER — IOHEXOL 300 MG/ML  SOLN
50.0000 mL | Freq: Once | INTRAMUSCULAR | Status: AC | PRN
  Administered 2022-05-27: 15 mL

## 2022-05-27 MED ORDER — ONDANSETRON HCL 4 MG/2ML IJ SOLN
INTRAMUSCULAR | Status: AC
  Administered 2022-05-27: 4 mg
  Filled 2022-05-27: qty 2

## 2022-05-27 MED ORDER — MIDAZOLAM HCL 2 MG/2ML IJ SOLN
INTRAMUSCULAR | Status: AC | PRN
  Administered 2022-05-27 (×2): 1 mg via INTRAVENOUS

## 2022-05-27 MED ORDER — HYDRALAZINE HCL 20 MG/ML IJ SOLN
INTRAMUSCULAR | Status: AC
  Filled 2022-05-27: qty 1

## 2022-05-27 MED ORDER — LIDOCAINE HCL 1 % IJ SOLN
INTRAMUSCULAR | Status: AC
  Administered 2022-05-27: 8 mL
  Filled 2022-05-27: qty 20

## 2022-05-27 MED ORDER — SODIUM CHLORIDE 0.9 % IV SOLN
2.0000 g | INTRAVENOUS | Status: AC
  Administered 2022-05-27: 2 g via INTRAVENOUS

## 2022-05-27 NOTE — Progress Notes (Addendum)
[facility] Progress Note  CC:  Obstructive jaundice   Subjective:  Having a lot of nausea and some hot flashes since procedure earlier today.    Objective:  Vital signs in last 24 hours: Temp:  [97.5 F (36.4 C)-98.6 F (37 C)] 97.5 F (36.4 C) (01/26 1054) Pulse Rate:  [60-86] 80 (01/26 1054) Resp:  [16-22] 16 (01/26 1054) BP: (147-240)/(87-135) 153/94 (01/26 1054) SpO2:  [96 %-100 %] 98 % (01/26 1054) Last BM Date : 05/27/22 General:  Alert, Well-developed, appears uncomfortable Heart:  Regular rate and rhythm; no murmurs Pulm:  CTAB.  No W/R/R. Abdomen:  Soft, non-distended.  BS present.  Non-tender.  Biliary drain noted with dark yellow liquid in bag. Extremities:  Without edema. Neurologic:  Alert and oriented x 4;  grossly normal neurologically. Psych:  Alert and cooperative. Normal mood and affect.  Intake/Output from previous day: 01/25 0701 - 01/26 0700 In: 730 [P.O.:720] Out: 450 [Drains:450]  Lab Results: Recent Labs    05/25/22 0116 05/26/22 0102 05/27/22 0420  WBC 13.1* 10.5 9.0  HGB 11.8* 11.8* 11.5*  HCT 33.3* 34.0* 33.9*  PLT 326 332 352   BMET Recent Labs    05/25/22 0116 05/26/22 0102 05/27/22 0420  NA 135 134* 134*  K 3.5 3.9 3.7  CL 103 103 102  CO2 24 23 20*  GLUCOSE 118* 123* 103*  BUN '7 9 11  '$ CREATININE 0.70 0.66 0.59  CALCIUM 8.6* 8.6* 9.0   LFT Recent Labs    05/27/22 0420  PROT 6.7  ALBUMIN 2.8*  AST 105*  ALT 257*  ALKPHOS 419*  BILITOT 7.2*    IR BILIARY DRAIN PLACEMENT WITH CHOLANGIOGRAM  Result Date: 05/25/2022 INDICATION: 62 year old female referred for PTC and drainage EXAM: IMAGE GUIDED PERCUTANEOUS TRANS HEPATIC CHOLANGIOGRAM AND EXTERNAL DRAINAGE MEDICATIONS: 2 g cefoxitin; The antibiotic was administered within an appropriate time frame prior to the initiation of the procedure. 4 mg Zofran, 25 mg IV Demerol ANESTHESIA/SEDATION: Moderate (conscious) sedation was employed during this  procedure. A total of Versed 1.5 mg and Fentanyl 75 mcg was administered intravenously by the radiology nurse. Total intra-service moderate Sedation Time: 35 minutes. The patient's level of consciousness and vital signs were monitored continuously by radiology nursing throughout the procedure under my direct supervision. FLUOROSCOPY: Radiation Exposure Index (as provided by the fluoroscopic device): 57 mGy Kerma COMPLICATIONS: SIR LEVEL B - Normal therapy, includes overnight admission for observation. Rigors PROCEDURE: The procedure, risks, benefits, and alternatives were explained to the patient and the patient's family. A complete informed consent was performed, with risk benefit analysis. Specific risks that were discussed for the procedure include bleeding, infection, biliary sepsis, IC use day, organ injury, need for further procedure, need for further surgery, long-term drain placement, cardiopulmonary collapse, death. Questions regarding the procedure were encouraged and answered. The patient understands and consents to the procedure. Patient is position in supine position on the fluoroscopy table, and the upper abdomen was prepped and draped in the usual sterile fashion. Maximum barrier sterile technique with sterile gowns and gloves were used for the procedure. A timeout was performed prior to the initiation of the procedure. Local anesthesia was provided with 1% lidocaine with epinephrine. Ultrasound survey of the right liver lobe was performed. Window into the right biliary system was only present for the right liver approach. 1% lidocaine was used for local anesthesia, with generous infiltration of the skin and subcutaneous tissues in and inter left costal location. A Chiba needle  was advanced under ultrasound guidance into the right liver lobe. Once the tip of the needle was confirmed within the biliary system by injecting small aliquots of contrast, images were stored of the biliary system after  partially opacifying the biliary tree via the needle. 018 wire was advanced centrally. The needle was removed, a small incision was made with an 11 blade scalpel, and then a triaxial Accustick system was advanced into the biliary system. The metal stiffener and dilator were removed, we confirmed placement with contrast infusion. Attempt was made to cross the obstruction of the common bile duct. Combination of standard stiff Glidewire, roadrunner wire, with coaxial 4 French glide cath were used. Unsuccessful in navigating the occlusion. The patient was beginning to experience rigors, and so we decided to withdraw at this time. 035 guidewire was advanced and the Accustick was removed. Dilation of the subcutaneous tissue tracks was performed with an 10 [place] dilator, and then a 10 [place] multipurpose drain was placed as an externalized biliary drain. Small amount of contrast confirmed location. Drain was sutured in position and attached to gravity. Sample was sent for culture The patient tolerated the procedure well and remained hemodynamically stable throughout. No complications were encountered and no significant blood loss was encountered. IMPRESSION: Status post image guided percutaneous transhepatic cholangiogram with placement of an externalized biliary stent. Signed, Dulcy Fanny. Dellia Nims, ABVM, RPVI [facility] Radiology PLAN: 24-48 hours of decompression will be required and then another attempt at an internal/external biliary drain. Electronically Signed   By: Corrie Mckusick D.O.   On: 05/25/2022 17:15    Assessment / Plan: 62 year old female with obstructive jaundice, right upper quadrant abdominal pain, dilation of proximal common bile duct to 1.3 cm, abrupt termination level of head of pancreas proximal to the ampulla Unsuccessful cannulation yesterday during an attempted ERCP Negative for ampullary lesion based on ERCP   Patient underwent PTC with IR  on 1/26.  External drain was placed.  Will attempt to convert to internal/external biliary drain after 24 to 48 hours of decompression.  Does not appear that brushings were obtained.  Question if this could be done during the converting step?   Total bili trending down to 7.2 today.  Other LFTs still remain elevated, but stable and slightly down-trending.  Had drain converted to internal/external biliary drain and had biliary brushings today by IR.  GI signing off.  If brushings are nondiagnostic then we would need to consider EUS, but otherwise no need for any further procedures.    LOS: 6 days   Laban Emperor. Zehr  05/27/2022, 2:27 PM   Attending physician's note   I have taken a history, reviewed the chart and examined the patient. I performed a substantive portion of this encounter, including complete performance of at least one of the key components, in conjunction with the APP. I agree with the APP's note, impression and recommendations.    S/p biliary brush biopsy and conversion of biliary drain to internal/external If brush biopsies are negative or inconclusive, will need EUS with FNA for diagnosis Continue supportive care and pain control Oncology consult pending biopsy  GI will sign off but available if needed, please call with any questions   The patient was provided an opportunity to ask questions and all were answered. The patient agreed with the plan and demonstrated an understanding of the instructions.   Damaris Hippo , MD [phone]

## 2022-05-27 NOTE — Procedures (Signed)
  Procedure:  Biliary brush biopsy Exchange biliary drain for internal/external device 71fPreprocedure diagnosis: .The primary encounter diagnosis was Obstructive jaundice. Diagnoses of Gallstones, Serum total bilirubin elevated, Elevated LFTs, and Hypokalemia were also pertinent to this visit.  Postprocedure diagnosis: same EBL:    minimal Complications:   none immediate  See full dictation in [facility]  DDillard CannonMD Main # [phone]  [phone] [phone]

## 2022-05-27 NOTE — Progress Notes (Signed)
ANTICOAGULATION CONSULT NOTE - Initial Consult  Pharmacy Consult for restart of lovenox post-IR procedure Indication: VTE prophylaxis  No Known Allergies  Vital Signs: Temp: 97.5 F (36.4 C) (01/26 1054) Temp Source: Oral (01/26 1054) BP: 153/94 (01/26 1054) Pulse Rate: 80 (01/26 1054)  Labs: Recent Labs    05/25/22 0116 05/26/22 0102 05/27/22 0420  HGB 11.8* 11.8* 11.5*  HCT 33.3* 34.0* 33.9*  PLT 326 332 352  CREATININE 0.70 0.66 0.59    CrCl cannot be calculated (Unknown ideal weight.).   Medical History: Past Medical History:  Diagnosis Date   Acute bronchitis    Allergy    Anemia    Anxiety    Arthritis    Cataract    BEGINIING   Cervical cancer ([place])    Degenerative joint disease    UPPER,LOWER BACK   Gallstones    H/O degenerative disc disease    H/O rheumatoid arthritis    Hypertension    Neuropathy    BILATERAL    Medications:  Medications Prior to Admission  Medication Sig Dispense Refill Last Dose   albuterol (VENTOLIN HFA) 108 (90 Base) MCG/ACT inhaler Inhale into the lungs.   UNK   cyclobenzaprine (FLEXERIL) 10 MG tablet Take 10 mg by mouth at bedtime.  0 Past Week   fluticasone (FLONASE) 50 MCG/ACT nasal spray Place 1 spray into both nostrils daily. (Patient taking differently: Place 1 spray into both nostrils daily as needed for allergies.) 9.9 mL 2 Past Week   levocetirizine (XYZAL) 5 MG tablet Take 5 mg by mouth at bedtime.   Past Week   lisinopril (ZESTRIL) 10 MG tablet Take 1 tablet (10 mg total) by mouth daily. 30 tablet 6 05/20/2022   montelukast (SINGULAIR) 10 MG tablet Take 10 mg by mouth at bedtime.   Past Week   traZODone (DESYREL) 100 MG tablet Take 100 mg by mouth at bedtime.   Past Week   Varenicline Tartrate, Starter, (CHANTIX STARTING MONTH PAK) 0.5 MG X 11 & 1 MG X 42 TBPK Days 1 to 3: 0.5 mg once daily. Days 4 to 7: 0.5 mg twice daily. Maintenance (day 8 and later): 1 mg twice daily (Patient taking differently: Take 0.5 mg by  mouth 2 (two) times daily.) 1 each 0 05/20/2022    Assessment: 50 YOF s/p biliary brush biopsy / exchange biliary drain. Consult received to restart vte ppx s/p procedure Bleed risk: low  Goal of Therapy:  Monitor platelets by anticoagulation protocol: Yes   Plan:  Lovenox 40 mg [place] qday starting 05/27/2022 at [place] BS, PharmD, BCPS Clinical Pharmacist 05/27/2022 1:43 PM  Contact: [phone] after 3 PM  "Be curious, not judgmental..." -Jamal Maes

## 2022-05-27 NOTE — Progress Notes (Signed)
HD#6 Subjective:   Summary: Michelle Williams is a 62 y.o. person living with a history of hypertension, COPD, cervical cancer s/p cryotherapy, anxiety, and possible iron deficiency anemia who presented with 4 weeks of progressive right upper quadrant pain, jaundice, diarrhea, and fatigue and admitted for obstructive jaundice with evidence of a lesion near the head of the pancreas.  Overnight Events: None  Patient was seen after repeat PTC and has had some nausea and vomiting since then.  She denies any new abdominal pain or other problems but the nausea has not gotten any better.  She has had a bowel movement since the procedure.  Objective:  Vital signs in last 24 hours: Vitals:   05/26/22 1154 05/26/22 1736 05/26/22 2044 05/27/22 0546  BP: (!) 143/90 (!) 152/87 (!) 147/91 (!) 168/90  Pulse: 63 72 60 70  Resp: '17 17 18 18  '$ Temp: 98.1 F (36.7 C) 98.2 F (36.8 C) 98.4 F (36.9 C) 98 F (36.7 C)  TempSrc: Oral Oral Oral Oral  SpO2: 98% 97% 100% 97%   Supplemental O2: Room Air SpO2: 97 % O2 Flow Rate (L/min): 2 L/min   Physical Exam:  Constitutional: Obviously jaundiced but well-appearing female. In no acute distress. Eyes: scleral icterus Cardio:Regular rate and rhythm.  Pulm:Clear to auscultation bilaterally. Normal work of breathing on room air. Abdomen: Soft, non-distended, positive bowel sounds.  Drain in place in right upper quadrant without signs of leaking or bleeding around it. MSK: Trace lower extremity edema Skin: Jaundiced.  Warm and dry. Neuro:Alert and oriented x3. No focal deficit noted. Psych:Pleasant mood and affect.  There were no vitals filed for this visit.   Intake/Output Summary (Last 24 hours) at 05/27/2022 0607 Last data filed at 05/26/2022 2221 Gross per 24 hour  Intake 720 ml  Output 250 ml  Net 470 ml   Net IO Since Admission: 2,750.33 mL [05/27/22 0607]  Pertinent Labs:    Latest Ref Rng & Units 05/27/2022    4:20 AM 05/26/2022     1:02 AM 05/25/2022    1:16 AM  CBC  WBC 4.0 - 10.5 K/uL 9.0  10.5  13.1   Hemoglobin 12.0 - 15.0 g/dL 11.5  11.8  11.8   Hematocrit 36.0 - 46.0 % 33.9  34.0  33.3   Platelets 150 - 400 K/uL 352  332  326        Latest Ref Rng & Units 05/27/2022    4:20 AM 05/26/2022    1:02 AM 05/25/2022    1:16 AM  CMP  Glucose 70 - 99 mg/dL 103  123  118   BUN 6 - 20 mg/dL '11  9  7   '$ Creatinine 0.44 - 1.00 mg/dL 0.59  0.66  0.70   Sodium 135 - 145 mmol/L 134  134  135   Potassium 3.5 - 5.1 mmol/L 3.7  3.9  3.5   Chloride 98 - 111 mmol/L 102  103  103   CO2 22 - 32 mmol/L '20  23  24   '$ Calcium 8.9 - 10.3 mg/dL 9.0  8.6  8.6   Total Protein 6.5 - 8.1 g/dL 6.7  6.8  6.5   Total Bilirubin 0.3 - 1.2 mg/dL 7.2  9.2  15.0   Alkaline Phos 38 - 126 U/L 419  483  494   AST 15 - 41 U/L 105  201  273   ALT 0 - 44 U/L 257  336  356  Imaging: No results found.  Assessment/Plan:   Principal Problem:   Obstructive jaundice Active Problems:   Elevated LFTs   Abnormal magnetic resonance cholangiopancreatography (MRCP)   Dilated cbd, acquired   Essential hypertension   Patient Summary: Michelle Williams is a 62 y.o. person living with a history of hypertension, COPD, cervical cancer s/p cryotherapy, anxiety, and possible iron deficiency anemia who presented with 4 weeks of progressive right upper quadrant pain, jaundice, diarrhea, and fatigue and admitted for obstructive jaundice with evidence of a lesion near the head of the pancreas.   Obstructive jaundice 2/2 a lesion near the head of the pancreas Conjugated hyperbilirubinemia Elevated LFTs/alk phos Nausea and vomiting Repeat PTC today with drain still in place.  Drain output of 450 mL since placement 2 days ago.  Breast biopsy was obtained during procedure.  Patient has had some nausea and vomiting after the procedure and was not responsive to the first dose of Zofran. - Reglan as needed - Holding pain medication but can add if pain worsens or  becomes constant   Hypokalemia, resolved Abnormal EKG EKG here without significant changes from 01/2022 but there is persistent LVH and T wave inversions in the inferior lateral leads. She has a cardiology follow-up this week so we will notify Dr. Irish Lack and see if he would like to get the echo while she is here. - AM BMP   Hypertension Prescribed lisinopril, triamterene/hydrochlorothiazide, and diltiazem.  - Continue lisinopril 10 mg daily - Hold triamterene/hydrochlorothiazide and diltiazem until reliable med rec has been completed   COPD Tobacco use disorder Pulmonary nodule - Continue Singulair 10 mg daily - Albuterol nebulizer as needed - Continue Chantix 0.5 mg twice daily  Diet: Regular IVF: None, VTE: Enoxaparin Code: DNR  Dispo: Anticipated discharge to Home pending further evaluation of obstructive jaundice.  [name], DO Internal Medicine Resident PGY-1 Pager: [phone]  Please contact the on call pager after 5 pm and on weekends at [phone].

## 2022-05-28 LAB — COMPREHENSIVE METABOLIC PANEL
ALT: 306 U/L — ABNORMAL HIGH (ref 0–44)
AST: 150 U/L — ABNORMAL HIGH (ref 15–41)
Albumin: 3.2 g/dL — ABNORMAL LOW (ref 3.5–5.0)
Alkaline Phosphatase: 418 U/L — ABNORMAL HIGH (ref 38–126)
Anion gap: 13 (ref 5–15)
BUN: 14 mg/dL (ref 6–20)
CO2: 20 mmol/L — ABNORMAL LOW (ref 22–32)
Calcium: 9.5 mg/dL (ref 8.9–10.3)
Chloride: 100 mmol/L (ref 98–111)
Creatinine, Ser: 0.76 mg/dL (ref 0.44–1.00)
GFR, Estimated: >60 mL/min (ref >60)
Glucose, Bld: 189 mg/dL — ABNORMAL HIGH (ref 70–99)
Potassium: 3.2 mmol/L — ABNORMAL LOW (ref 3.5–5.1)
Sodium: 133 mmol/L — ABNORMAL LOW (ref 135–145)
Total Bilirubin: 7.4 mg/dL — ABNORMAL HIGH (ref 0.3–1.2)
Total Protein: 7.7 g/dL (ref 6.5–8.1)

## 2022-05-28 LAB — CBC
HCT: 40.2 % (ref 36.0–46.0)
Hemoglobin: 13.3 g/dL (ref 12.0–15.0)
MCH: 30 pg (ref 26.0–34.0)
MCHC: 33.1 g/dL (ref 30.0–36.0)
MCV: 90.7 fL (ref 80.0–100.0)
Platelets: 433 10*3/uL — ABNORMAL HIGH (ref 150–400)
RBC: 4.43 MIL/uL (ref 3.87–5.11)
RDW: 16.9 % — ABNORMAL HIGH (ref 11.5–15.5)
WBC: 13.6 10*3/uL — ABNORMAL HIGH (ref 4.0–10.5)
nRBC: 0 % (ref 0.0–0.2)

## 2022-05-28 MED ORDER — ONDANSETRON HCL 4 MG PO TABS: 4.0000 mg | ORAL_TABLET | Freq: Every day | ORAL | 1 refills | Status: DC | PRN

## 2022-05-28 MED ORDER — POTASSIUM CHLORIDE CRYS ER 20 MEQ PO TBCR
40.0000 meq | EXTENDED_RELEASE_TABLET | Freq: Two times a day (BID) | ORAL | Status: DC
  Administered 2022-05-28: 40 meq via ORAL
  Filled 2022-05-28: qty 2

## 2022-05-28 MED ORDER — LISINOPRIL 20 MG PO TABS
20.0000 mg | ORAL_TABLET | Freq: Every day | ORAL | Status: DC
  Administered 2022-05-28: 20 mg via ORAL
  Filled 2022-05-28: qty 1

## 2022-05-28 MED ORDER — AMLODIPINE BESYLATE 5 MG PO TABS: 5.0000 mg | ORAL_TABLET | Freq: Every day | ORAL | Status: DC

## 2022-05-28 NOTE — Discharge Summary (Signed)
Name: Michelle Williams MRN: [id number] DOB: 1960-07-20 62 y.o. PCP: Cipriano Mile, NP  Date of Admission: 05/21/2022  8:43 AM Date of Discharge:  05/28/2021 Attending Physician: Dr.  Johnnye Sima  DISCHARGE DIAGNOSIS:  Primary Problem: Obstructive jaundice   Hospital Problems: Principal Problem:   Obstructive jaundice Active Problems:   Elevated LFTs   Abnormal magnetic resonance cholangiopancreatography (MRCP)   Dilated cbd, acquired   Essential hypertension    DISCHARGE MEDICATIONS:   Allergies as of 05/28/2022   No Known Allergies      Medication List     TAKE these medications    albuterol 108 (90 Base) MCG/ACT inhaler Commonly known as: VENTOLIN HFA Inhale into the lungs.   cyclobenzaprine 10 MG tablet Commonly known as: FLEXERIL Take 10 mg by mouth at bedtime.   fluticasone 50 MCG/ACT nasal spray Commonly known as: FLONASE Place 1 spray into both nostrils daily. What changed:  when to take this reasons to take this   levocetirizine 5 MG tablet Commonly known as: XYZAL Take 5 mg by mouth at bedtime.   lisinopril 10 MG tablet Commonly known as: ZESTRIL Take 1 tablet (10 mg total) by mouth daily.   montelukast 10 MG tablet Commonly known as: SINGULAIR Take 10 mg by mouth at bedtime.   ondansetron 4 MG tablet Commonly known as: Zofran Take 1 tablet (4 mg total) by mouth daily as needed for nausea or vomiting.   traZODone 100 MG tablet Commonly known as: DESYREL Take 100 mg by mouth at bedtime.   Varenicline Tartrate (Starter) 0.5 MG X 11 & 1 MG X 42 Tbpk Commonly known as: Chantix Starting Month Pak Days 1 to 3: 0.5 mg once daily. Days 4 to 7: 0.5 mg twice daily. Maintenance (day 8 and later): 1 mg twice daily What changed:  how much to take how to take this when to take this additional instructions        DISPOSITION AND FOLLOW-UP:  Ms.Kailin J Klecker was discharged from [facility] in Stable condition. At the hospital  follow up visit please address:  Obstructive jaundice 2/2 lesion near head of pancreas Conjugated hyperbilirubinemia Elevated LFTs and alk phos Nausea and vomiting Ensure follow-up with interventional radiology, refer to oncology once biopsy results are reported. Follow-up biopsy results. Recheck CMP.   Hypokalemia, resolved Abnormal EKG Recheck CMP.   COPD Tobacco use disorder Pulmonary nodule Ensure 49-monthfollow up low-dose CT for monitoring of RUL lung nodule. Continue to encourage cessation.  Follow-up Recommendations: Consults: IR, Oncology, GI Labs: Comprehensive Metabolic Panel Studies: Biopsy results Medications: No medication changes at time of discharge.  Follow-up Appointments:  Follow-up Information     Interventional Radiology Follow up.   Why: You will receive a call to schedule a follow-up appointment.        [place].   Why: You will receive a call to schedule a follow-up appointment. Contact information: [id number]:  Patient Summary: Obstructive jaundice 2/2 lesion near head of pancreas Conjugated hyperbilirubinemia Elevated LFTs and alk phos Nausea and vomiting Patient presented with jaundice, RUQ pain, and diarrhea over preceding 4 weeks. Presenting labs significant for bilirubin 14.9 and direct bilirubin 9.3, indirect bilirubin 5.6, AST/ALT 293/460, alk phos 435. UA with small bilirubin. RUQ ultrasound showed cholelithiasis without additional changes to suggest acute cholecystitis and dilated  common bile duct at 13 mm, increasing since previous study. CT abdomen/pelvis revealed dilatation of the proximal common bile duct measuring up to 1.3 cm, abrupt termination the dilated common bile duct at the level of the head of pancreas, main duct dilatation which abruptly terminates at the level of the head of pancreas, proximal  to the ampulla, and no gallbladder wall thickening or pericholecystic fluid identified. GI was consulted with recommendation of MRCP. MRCP was attempted was unsuccessful due to premature cessation due to patient anxiety but in the limited study did show marked intrahepatic and common bile duct dilatation, abrupt termination of the common bile duct within the head of pancreas, diffuse main duct dilatation up to the level of the head of pancreas which measures 6 mm in maximum diameter, a dilated main duct that terminates at the same level as the termination of the CBD, with the signal abnormality measuring approximately 1.9 x 0.7 cm. ERCP was attempted however unable to cannulate the ducts. She underwent image-guided drain placement with externalized biliary drain 01/24 and hepatic enzymes showed improvement with drainage of bile. Culture obtained during this procedure has no growth at time of discharge. On 01/26 she underwent exchange of the biliary drain with biliary brush biopsy and tolerated the procedure well. CA 19-9 was elevated at 158, hepatic function was stable and improved from admission. She was discharged after receiving education regarding her drain by the interventional radiology team. Per GI, she will need EUS or FNA for diagnosis if the brush biopsy is negative or inconclusive. She will need oncology consult once biopsy.   Hypokalemia, resolved Abnormal EKG Presenting labs with potassium 2.9 and no EKG changes from prior EKG 01/2022. This was thought to be 2/2 GI losses. Potassium was supplemented as needed throughout admission.   Hypertension On presentation, patient was mildly hypertensive and home lisinopril was continued. There was some uncertainty regarding her home regimen beyond lisinopril, and given response to monotherapy, diltiazem and triamterene-HCTZ was held. BP was overall stable throughout admission. She was advised to restart her home anti-hypertensive regimen at discharge.    COPD Tobacco use disorder Pulmonary nodule CT abdomen/pelvis revealed 2 mm RUL lung nodule consistent with a 2.6 mm anterior RML nodule seen on lung cancer screening CT with lung-RADS 2, recommended for repeat low-dose CT in 12 months. Continued home Singulair, chantix, and PRN albuterol.   DISCHARGE INSTRUCTIONS:   Discharge Instructions     Call MD for:  persistant nausea and vomiting   Complete by: As directed    Call MD for:  redness, tenderness, or signs of infection (pain, swelling, redness, odor or green/yellow discharge around incision site)   Complete by: As directed    Call MD for:  severe uncontrolled pain   Complete by: As directed    Call MD for:  temperature >100.4   Complete by: As directed    Discharge instructions   Complete by: As directed    Ms. Jarema,  It was a pleasure to care for you during your stay at [facility] received instructions on how to care for your drain prior to leaving today. You will follow up with the interventional radiologist team after discharge and their office will help arrange that follow up. I will also have our clinic call to schedule a follow up visit for you in the next week.  I have sent Zofran which you can take once daily as needed for severe nausea. I recommend you slowly increase your  diet to make sure you aren't nauseated or vomiting.  My best, Dr. Marlou Sa   Increase activity slowly   Complete by: As directed    No wound care   Complete by: As directed        SUBJECTIVE:  Patient states she had a rough night last night due to nausea and vomiting following her procedure. This morning she has tolerated coffee and water without difficulties and plans to slowly progress her diet. She does not report having any pain right now. She has the funeral of a close friend today at noon and is sad about this. She confirms that at home she is taking lisinopril 10 mg daily and diltiazem 120 mg daily.   Discharge Vitals:    BP (!) 184/109 (BP Location: Right Arm)   Pulse 85   Temp 98.3 F (36.8 C) (Oral)   Resp 18   SpO2 100%   OBJECTIVE:  Physical Exam Constitutional:      General: She is not in acute distress. Cardiovascular:     Rate and Rhythm: Normal rate and regular rhythm.  Pulmonary:     Effort: Pulmonary effort is normal.     Breath sounds: Normal breath sounds.  Abdominal:     General: Bowel sounds are normal. There is no distension.     Palpations: Abdomen is soft.     Tenderness: There is no abdominal tenderness.     Comments: Drain with wound dressing extending from RUQ with bilious contents.  Skin:    General: Skin is warm and dry.     Coloration: Skin is jaundiced.     Comments: +Scleral icterus  Neurological:     General: No focal deficit present.     Mental Status: She is alert and oriented to person, place, and time.  Psychiatric:     Comments: Tearful.     Pertinent Labs, Studies, and Procedures:     Latest Ref Rng & Units 05/28/2022    1:40 AM 05/27/2022    4:20 AM 05/26/2022    1:02 AM  CBC  WBC 4.0 - 10.5 K/uL 13.6  9.0  10.5   Hemoglobin 12.0 - 15.0 g/dL 13.3  11.5  11.8   Hematocrit 36.0 - 46.0 % 40.2  33.9  34.0   Platelets 150 - 400 K/uL 433  352  332        Latest Ref Rng & Units 05/28/2022    1:40 AM 05/27/2022    4:20 AM 05/26/2022    1:02 AM  CMP  Glucose 70 - 99 mg/dL 189  103  123   BUN 6 - 20 mg/dL '14  11  9   '$ Creatinine 0.44 - 1.00 mg/dL 0.76  0.59  0.66   Sodium 135 - 145 mmol/L 133  134  134   Potassium 3.5 - 5.1 mmol/L 3.2  3.7  3.9   Chloride 98 - 111 mmol/L 100  102  103   CO2 22 - 32 mmol/L '20  20  23   '$ Calcium 8.9 - 10.3 mg/dL 9.5  9.0  8.6   Total Protein 6.5 - 8.1 g/dL 7.7  6.7  6.8   Total Bilirubin 0.3 - 1.2 mg/dL 7.4  7.2  9.2   Alkaline Phos 38 - 126 U/L 418  419  483   AST 15 - 41 U/L 150  105  201   ALT 0 - 44 U/L 306  257  336     MR ABDOMEN MRCP WO  CONTRAST  Result Date: 05/22/2022 CLINICAL DATA:  Jaundice. EXAM: MRI  ABDOMEN WITHOUT CONTRAST  (INCLUDING MRCP) TECHNIQUE: Multiplanar multisequence MR imaging of the abdomen was performed. Heavily T2-weighted images of the biliary and pancreatic ducts were obtained, and three-dimensional MRCP images were rendered by post processing. COMPARISON:  CT 05/21/2022 FINDINGS: Note: Exam detail is diminished. Patient aborted exam prior to completion of study. No composed contrast imaging obtained. Additionally, there is motion artifact which further diminishes diagnostic assessment of the area of concern within the head of pancreas. Lower chest: No pleural fluid identified. Atelectasis noted within both lung bases. Hepatobiliary: Within the limitations of motion artifact there is no focal liver abnormality. No significant gallbladder wall thickening. As noted on recent CT there is marked intrahepatic and common bile duct dilatation. The common bile duct measures 1.4 cm in maximum diameter, image 15/5. Abrupt termination of the common bile duct within the head of pancreas is noted. Pancreas: No pancreatic inflammation. There is diffuse main duct dilatation up to the level of the head of pancreas which measures 6 mm in maximum diameter, image 25/3. The dilated pancreatic duct terminates at a similar level as the dilated common bile duct. Here, there is a focal area of slight increased T2 signal and decreased T1 signal within the head of pancreas measuring approximately 1.9 x 0.7 cm, image 25/3 and image number 64/7. Imaging findings are highly suspicious for occult neoplasm within the head of pancreas, image 15/5. Spleen:  Within normal limits in size and appearance. Adrenals/Urinary Tract: Normal adrenal glands. No kidney mass or hydronephrosis. Stomach/Bowel: Visualized portions within the abdomen are unremarkable. Vascular/Lymphatic: Normal caliber of the abdominal aorta. No upper abdominal adenopathy identified. Other:  No ascites. Musculoskeletal: No suspicious bone lesions identified.  IMPRESSION: 1. Exam detail is diminished. Patient aborted exam prior to completion of study and prior to administration of intravenous contrast material. Additionally, there is motion artifact which further diminishes diagnostic assessment of the area of concern within the head of pancreas. 2. Marked intrahepatic and common bile duct dilatation. Abrupt termination of the common bile duct within the head of pancreas is noted. There is diffuse main duct dilatation up to the level of the head of pancreas which measures 6 mm in maximum diameter. The dilated main duct terminates at the same level as the termination of the CBD. Here, there is a subtle signal abnormality measuring approximately 1.9 x 0.7 cm. Imaging findings are concerning for underlying neoplasm within the head of pancreas. Advise further evaluation with ERCP and endoscopic ultrasound. Electronically Signed   By: Kerby Moors M.D.   On: 05/22/2022 04:31   CT ABDOMEN PELVIS W CONTRAST  Result Date: 05/21/2022 CLINICAL DATA:  Right upper quadrant pain EXAM: CT ABDOMEN AND PELVIS WITH CONTRAST TECHNIQUE: Multidetector CT imaging of the abdomen and pelvis was performed using the standard protocol following bolus administration of intravenous contrast. RADIATION DOSE REDUCTION: This exam was performed according to the departmental dose-optimization program which includes automated exposure control, adjustment of the mA and/or kV according to patient size and/or use of iterative reconstruction technique. CONTRAST:  15m OMNIPAQUE IOHEXOL 350 MG/ML SOLN COMPARISON:  Abdominal sonogram 05/20/2022 and CT AP 08/01/2012 FINDINGS: Lower chest: Atelectasis and ground-glass attenuation noted within the posterior lung bases. No pleural fluid or airspace disease. Subpleural nodule in the anterior right upper lobe measures 2 mm Hepatobiliary: No focal liver lesion identified. Stones noted on recent gallbladder ultrasound are not CT visible. There is no gallbladder  wall thickening or  pericholecystic fluid identified. Dilatation of the proximal common bile duct measures up to 1.3 cm. Abrupt termination the dilated common bile duct at the level of the head of pancreas, image 42/7. Pancreas: Unremarkable. No pancreatic ductal dilatation or surrounding inflammatory changes. Spleen: Normal in size without focal abnormality. Adrenals/Urinary Tract: Adrenal glands are unremarkable. Kidneys are normal, without renal calculi, focal lesion, or hydronephrosis. Bladder is unremarkable. Stomach/Bowel: Stomach is normal. The appendix is not confidently identified. No bowel wall thickening, inflammation, or distension. Vascular/Lymphatic: Aortic atherosclerosis. No aneurysm. No signs abdominopelvic adenopathy. Reproductive: Uterus and bilateral adnexa are unremarkable. Other: No ascites or focal fluid collections. No signs of pneumoperitoneum. Musculoskeletal: No acute or significant osseous findings. IMPRESSION: 1. Dilatation of the proximal common bile duct measures up to 1.3 cm. Abrupt termination the dilated common bile duct at the level of the head of pancreas. There is also main duct dilatation which abruptly terminates at the level of the head of pancreas, proximal to the ampulla. Imaging findings are concerning for underlying obstructing lesion within the head of pancreas. Further evaluation with contrast enhanced MRI/MRCP is advised. 2. Stones noted on recent gallbladder ultrasound are not CT visible. There is no gallbladder wall thickening or pericholecystic fluid identified. 3. 2 mm right upper lobe lung nodule. Nonspecific. See lung cancer screening CT from 04/15/2022 for follow-up recommendations. 4.  Aortic Atherosclerosis (ICD10-I70.0). Electronically Signed   By: Kerby Moors M.D.   On: 05/21/2022 13:50     Signed: Farrel Gordon, D.O.  Internal Medicine Resident, PGY-2 [facility]  Pager: [phone]

## 2022-05-28 NOTE — Progress Notes (Signed)
Referring Physician(s): Dr. Silverio Decamp  Supervising Physician: Sandi Mariscal  Patient Status:  [facility] - In-pt  Chief Complaint: Biliary obstruction  Subjective: Resting comfortably in bed.  Remains jaundiced with scleral icterus.  Biliary drain in place with dark, bilious output.   Allergies: Patient has no known allergies.  Medications: Prior to Admission medications   Medication Sig Start Date End Date Taking? Authorizing Provider  albuterol (VENTOLIN HFA) 108 (90 Base) MCG/ACT inhaler Inhale into the lungs. 02/25/22  Yes [provider]  cyclobenzaprine (FLEXERIL) 10 MG tablet Take 10 mg by mouth at bedtime. 12/12/16  Yes [provider]  fluticasone (FLONASE) 50 MCG/ACT nasal spray Place 1 spray into both nostrils daily. Patient taking differently: Place 1 spray into both nostrils daily as needed for allergies. 02/19/22  Yes Nanavati, Ankit, MD  levocetirizine (XYZAL) 5 MG tablet Take 5 mg by mouth at bedtime.   Yes [provider]  lisinopril (ZESTRIL) 10 MG tablet Take 1 tablet (10 mg total) by mouth daily. 05/06/22  Yes Jettie Booze, MD  montelukast (SINGULAIR) 10 MG tablet Take 10 mg by mouth at bedtime.   Yes [provider]  ondansetron (ZOFRAN) 4 MG tablet Take 1 tablet (4 mg total) by mouth daily as needed for nausea or vomiting. 05/28/22 05/28/23 Yes Farrel Gordon, DO  traZODone (DESYREL) 100 MG tablet Take 100 mg by mouth at bedtime. 03/28/22  Yes [provider]  Varenicline Tartrate, Starter, (CHANTIX STARTING MONTH PAK) 0.5 MG X 11 & 1 MG X 42 TBPK Days 1 to 3: 0.5 mg once daily. Days 4 to 7: 0.5 mg twice daily. Maintenance (day 8 and later): 1 mg twice daily Patient taking differently: Take 0.5 mg by mouth 2 (two) times daily. 05/06/22  Yes Jettie Booze, MD     Vital Signs: BP (!) 184/109 (BP Location: Right Arm)   Pulse 85   Temp 98.3 F (36.8 C) (Oral)   Resp 18   SpO2 100%   Physical Exam NAD,  alert Abdomen: RUQ drain in place.  Insertion site intact.  Dressing clean and dry.  Dark, green bilious output in gravity bag.   Imaging: IR INT EXT BILIARY DRAIN WITH CHOLANGIOGRAM  Result Date: 05/27/2022 INDICATION: Biliary obstruction, Status post percutaneous biliary drain catheter placement 05/25/2022. Internalization with breast biopsy requested. EXAM: EXCHANGE OF BILIARY DRAIN FOR INTERNAL/EXTERNAL CATHETER BRUSH BIOPSY UNDER FLUOROSCOPY MEDICATIONS: Rocephin 2 g IV; the antibiotic was administered within an appropriate time frame prior to the initiation of the procedure. Patient received 10 mg hydralazine IV during the procedure due to hypertension ANESTHESIA/SEDATION: Intravenous Fentanyl 146mg and Versed '2mg'$  were administered as conscious sedation during continuous monitoring of the patient's level of consciousness and physiological / cardiorespiratory status by the radiology RN, with a total moderate sedation time of 18 minutes. FLUOROSCOPY: Radiation Exposure Index (as provided by the fluoroscopic device): 13 mGy Kerma COMPLICATIONS: None immediate. PROCEDURE: Informed written consent was obtained from the patient after a thorough discussion of the procedural risks, benefits and alternatives. All questions were addressed. Maximal Sterile Barrier Technique was utilized including caps, mask, sterile gowns, sterile gloves, sterile drape, hand hygiene and skin antiseptic. A timeout was performed prior to the initiation of the procedure. Contrast injection through the existing catheter demonstrates good position within the biliary tree. No contrast passage into the duodenum. Catheter was then cut and exchanged over 035 wire for a 5 [place] catheter, negotiated across the distal CBD obstruction into the distal duodenum.  Catheter exchanged over Amplatz wire for a 5 French vascular sheath. Brush biopsy x2 of the distal CBD obstruction region was performed, and submitted to cytopathology. The  angiographic catheter was then exchanged for a new 10 French multi sidehole internal/external biliary drainage catheter, positioned with the distal end locked in the distal duodenum, proximal side holes spanning the CBD obstruction. Catheter injection shows catheter patency, with good positioning, and decompression of the biliary tree. Catheter secured externally with 0 Prolene suture and placed to external drainage bag for now. The patient tolerated the procedure well. IMPRESSION: 1. Successful exchange of percutaneous biliary drain to an internal/external biliary drain. 2. Brush biopsy of distal CBD obstruction. Electronically Signed   By: Lucrezia Europe M.D.   On: 05/27/2022 15:49   IR BILIARY DRAIN PLACEMENT WITH CHOLANGIOGRAM  Result Date: 05/25/2022 INDICATION: 62 year old female referred for PTC and drainage EXAM: IMAGE GUIDED PERCUTANEOUS TRANS HEPATIC CHOLANGIOGRAM AND EXTERNAL DRAINAGE MEDICATIONS: 2 g cefoxitin; The antibiotic was administered within an appropriate time frame prior to the initiation of the procedure. 4 mg Zofran, 25 mg IV Demerol ANESTHESIA/SEDATION: Moderate (conscious) sedation was employed during this procedure. A total of Versed 1.5 mg and Fentanyl 75 mcg was administered intravenously by the radiology nurse. Total intra-service moderate Sedation Time: 35 minutes. The patient's level of consciousness and vital signs were monitored continuously by radiology nursing throughout the procedure under my direct supervision. FLUOROSCOPY: Radiation Exposure Index (as provided by the fluoroscopic device): 57 mGy Kerma COMPLICATIONS: SIR LEVEL B - Normal therapy, includes overnight admission for observation. Rigors PROCEDURE: The procedure, risks, benefits, and alternatives were explained to the patient and the patient's family. A complete informed consent was performed, with risk benefit analysis. Specific risks that were discussed for the procedure include bleeding, infection, biliary sepsis,  IC use day, organ injury, need for further procedure, need for further surgery, long-term drain placement, cardiopulmonary collapse, death. Questions regarding the procedure were encouraged and answered. The patient understands and consents to the procedure. Patient is position in supine position on the fluoroscopy table, and the upper abdomen was prepped and draped in the usual sterile fashion. Maximum barrier sterile technique with sterile gowns and gloves were used for the procedure. A timeout was performed prior to the initiation of the procedure. Local anesthesia was provided with 1% lidocaine with epinephrine. Ultrasound survey of the right liver lobe was performed. Window into the right biliary system was only present for the right liver approach. 1% lidocaine was used for local anesthesia, with generous infiltration of the skin and subcutaneous tissues in and inter left costal location. A Chiba needle was advanced under ultrasound guidance into the right liver lobe. Once the tip of the needle was confirmed within the biliary system by injecting small aliquots of contrast, images were stored of the biliary system after partially opacifying the biliary tree via the needle. 018 wire was advanced centrally. The needle was removed, a small incision was made with an 11 blade scalpel, and then a triaxial Accustick system was advanced into the biliary system. The metal stiffener and dilator were removed, we confirmed placement with contrast infusion. Attempt was made to cross the obstruction of the common bile duct. Combination of standard stiff Glidewire, roadrunner wire, with coaxial 4 French glide cath were used. Unsuccessful in navigating the occlusion. The patient was beginning to experience rigors, and so we decided to withdraw at this time. 035 guidewire was advanced and the Accustick was removed. Dilation of the subcutaneous tissue tracks was performed  with an 10 [place] dilator, and then a 10 [place]  multipurpose drain was placed as an externalized biliary drain. Small amount of contrast confirmed location. Drain was sutured in position and attached to gravity. Sample was sent for culture The patient tolerated the procedure well and remained hemodynamically stable throughout. No complications were encountered and no significant blood loss was encountered. IMPRESSION: Status post image guided percutaneous transhepatic cholangiogram with placement of an externalized biliary stent. Signed, Dulcy Fanny. Dellia Nims, ABVM, RPVI [facility] Radiology PLAN: 24-48 hours of decompression will be required and then another attempt at an internal/external biliary drain. Electronically Signed   By: Corrie Mckusick D.O.   On: 05/25/2022 17:15    Labs:  CBC: Recent Labs    05/25/22 0116 05/26/22 0102 05/27/22 0420 05/28/22 0140  WBC 13.1* 10.5 9.0 13.6*  HGB 11.8* 11.8* 11.5* 13.3  HCT 33.3* 34.0* 33.9* 40.2  PLT 326 332 352 433*    COAGS: Recent Labs    05/20/22 2050  INR 1.1  APTT 35    BMP: Recent Labs    05/25/22 0116 05/26/22 0102 05/27/22 0420 05/28/22 0140  NA 135 134* 134* 133*  K 3.5 3.9 3.7 3.2*  CL 103 103 102 100  CO2 24 23 20* 20*  GLUCOSE 118* 123* 103* 189*  BUN '7 9 11 14  '$ CALCIUM 8.6* 8.6* 9.0 9.5  CREATININE 0.70 0.66 0.59 0.76  GFRNONAA >60 >60 >60 >60    LIVER FUNCTION TESTS: Recent Labs    05/25/22 0116 05/26/22 0102 05/27/22 0420 05/28/22 0140  BILITOT 15.0* 9.2* 7.2* 7.4*  AST 273* 201* 105* 150*  ALT 356* 336* 257* 306*  ALKPHOS 494* 483* 419* 418*  PROT 6.5 6.8 6.7 7.7  ALBUMIN 2.7* 2.7* 2.8* 3.2*    Assessment and Plan: Biliary obstruction s/p external biliary drain placement 1/24 with internal/external conversion 05/27/22 by Dr. Vernard Gambles Patient remained to external drainage/decompression overnight.  She reports she is feeling well this AM with no nausea or abdominal pain.  As planned, drain was capped  and she was given an additional bag to use if her symptoms return.   Patient verbalizes understanding of plan.  Schedulers will contact her with date and time of follow-up.  Continue site care as needed to keep insertion site clean and dry.   Electronically Signed: Docia Barrier, PA 05/28/2022, 12:52 PM   I spent a total of 15 Minutes at the the patient's bedside AND on the patient's hospital floor or unit, greater than 50% of which was counseling/coordinating care for biliary obstruction.

## 2022-05-28 NOTE — Plan of Care (Signed)

## 2022-05-30 ENCOUNTER — Other Ambulatory Visit (HOSPITAL_COMMUNITY): Payer: Self-pay | Admitting: Student

## 2022-05-30 ENCOUNTER — Encounter (HOSPITAL_COMMUNITY): Payer: Self-pay

## 2022-05-30 DIAGNOSIS — K8042 Calculus of bile duct with acute cholecystitis without obstruction: Secondary | ICD-10-CM

## 2022-05-30 HISTORY — PX: IR ENDOLUMINAL BX OF BILIARY TREE: IMG6053

## 2022-05-30 LAB — AEROBIC/ANAEROBIC CULTURE W GRAM STAIN (SURGICAL/DEEP WOUND)
Culture: NO GROWTH
Gram Stain: NONE SEEN

## 2022-06-01 ENCOUNTER — Ambulatory Visit (HOSPITAL_COMMUNITY): Payer: 59 | Attending: Cardiology

## 2022-06-01 DIAGNOSIS — R9431 Abnormal electrocardiogram [ECG] [EKG]: Secondary | ICD-10-CM

## 2022-06-01 LAB — ECHOCARDIOGRAM COMPLETE
AR max vel: 2.37 cm2
AV Area VTI: 2.49 cm2
AV Area mean vel: 2.3 cm2
AV Mean grad: 9 mmHg
AV Peak grad: 17.7 mmHg
Ao pk vel: 2.11 m/s
Area-P 1/2: 2.94 cm2
S' Lateral: 2.3 cm

## 2022-06-01 LAB — CYTOLOGY - NON PAP

## 2022-06-03 NOTE — Progress Notes (Signed)
She is scheduled to follow-up with me on Tuesday so I plan to discuss the results with her in person and refer her to oncology at that visit. I prefer to discuss something like this in person but if you think it is appropriate, I can discuss the news with her on the phone and initiate the oncology referral right away.

## 2022-06-06 ENCOUNTER — Other Ambulatory Visit: Payer: Self-pay | Admitting: Internal Medicine

## 2022-06-06 DIAGNOSIS — C259 Malignant neoplasm of pancreas, unspecified: Secondary | ICD-10-CM

## 2022-06-07 ENCOUNTER — Telehealth: Payer: Self-pay | Admitting: Physician Assistant

## 2022-06-07 ENCOUNTER — Telehealth: Payer: Self-pay | Admitting: Infectious Diseases

## 2022-06-07 ENCOUNTER — Ambulatory Visit: Payer: 59

## 2022-06-07 NOTE — Telephone Encounter (Signed)
Scheduled appt per 2/6 referral. Pt is aware of appt date and time. Pt is aware to arrive 15 mins prior to appt time and to bring and updated insurance card. Pt is aware of appt location.

## 2022-06-07 NOTE — Telephone Encounter (Signed)
Called pt to discuss her dx- adeno Ca of pancreas.  No answer on home and cell phones.  Left VM asking her to call back.  Reached out to Onc and they had called her and discussed her dx with her previously. She canceled her appt with them.  Will continue to try to get her in with onc asap.

## 2022-06-08 ENCOUNTER — Other Ambulatory Visit: Payer: Self-pay | Admitting: Genetic Counselor

## 2022-06-08 DIAGNOSIS — C259 Malignant neoplasm of pancreas, unspecified: Secondary | ICD-10-CM

## 2022-06-09 ENCOUNTER — Encounter: Payer: 59 | Admitting: Student

## 2022-06-09 NOTE — Progress Notes (Signed)
The patient was scheduled to establish care at [facility] on an outpatient basis for her newly diagnosed pancreatic cancer today.  The patient was recently hospitalized for obstructive jaundice (05/21/22-05/28/22) and biliary brushings revealed adenocarcinoma in the head of the pancreas.    Upon presenting to the clinic today, the patient was hypotensive with systolic blood pressure in the 50s with associated dizziness, nausea, vomiting, and abdominal pain.  The patient was subsequently sent to the emergency room for further evaluation management.  Dr. Burr Medico will see the patient while admitted to discuss her current condition and recommended further workup for her malignancy.

## 2022-06-09 NOTE — Progress Notes (Deleted)
CC: Establish Care  HPI:  Michelle Williams is a 62 y.o. female with a past medical history of HTN, DDD, anxiety, with recent hx of obstructive jauncdice and presenting to the clinic today for hospital follow up and establishment of care. Was recently hospitalized for jaundice and found to have pancreatic adenocarcinoma. Please see problem based assessment and plan for additional details.  Past Medical History:  Diagnosis Date   Acute bronchitis    Allergy    Anemia    Anxiety    Arthritis    Cataract    BEGINIING   Cervical cancer ([address])    Degenerative joint disease    UPPER,LOWER BACK   Gallstones    H/O degenerative disc disease    H/O rheumatoid arthritis    Hypertension    Neuropathy    BILATERAL      Current Outpatient Medications (Cardiovascular):    lisinopril (ZESTRIL) 10 MG tablet, Take 1 tablet (10 mg total) by mouth daily.  Current Outpatient Medications (Respiratory):    albuterol (VENTOLIN HFA) 108 (90 Base) MCG/ACT inhaler, Inhale into the lungs.   fluticasone (FLONASE) 50 MCG/ACT nasal spray, Place 1 spray into both nostrils daily. (Patient taking differently: Place 1 spray into both nostrils daily as needed for allergies.)   levocetirizine (XYZAL) 5 MG tablet, Take 5 mg by mouth at bedtime.   montelukast (SINGULAIR) 10 MG tablet, Take 10 mg by mouth at bedtime.    Current Outpatient Medications (Other):    cyclobenzaprine (FLEXERIL) 10 MG tablet, Take 10 mg by mouth at bedtime.   ondansetron (ZOFRAN) 4 MG tablet, Take 1 tablet (4 mg total) by mouth daily as needed for nausea or vomiting.   traZODone (DESYREL) 100 MG tablet, Take 100 mg by mouth at bedtime.   Varenicline Tartrate, Starter, (CHANTIX STARTING MONTH PAK) 0.5 MG X 11 & 1 MG X 42 TBPK, Days 1 to 3: 0.5 mg once daily. Days 4 to 7: 0.5 mg twice daily. Maintenance (day 8 and later): 1 mg twice daily (Patient taking differently: Take 0.5 mg by mouth 2 (two) times daily.)  Family History   Problem Relation Age of Onset   Hypertension Mother    Stroke Mother    Early death Father    Colon cancer Brother        unsure age when diagnosed    HIV/AIDS Brother    Diabetes Maternal Grandmother    Colon polyps Neg Hx    Esophageal cancer Neg Hx    Rectal cancer Neg Hx    Stomach cancer Neg Hx    Crohn's disease Neg Hx    Ulcerative colitis Neg Hx     Social History: ***  Review of Systems: ROS negative except for what is noted on the assessment and plan.  There were no vitals filed for this visit.   Physical Exam: General: Well appearing ***, NAD HENT: normocephalic, atraumatic EYES: conjunctiva non-erythematous, no scleral icterus CV: regular rate, normal rhythm, no murmurs, rubs, gallops. Pulmonary: normal work of breathing on RA, lungs clear to auscultation, no rales, wheezes, rhonchi Abdominal: non-distended, soft, non-tender to palpation, normal BS Skin: Warm and dry, no rashes or lesions Neurological: MS: awake, alert and oriented x3, normal speech and fund of knowledge Motor: moves all extremities antigravity Psych: normal affect    Assessment & Plan:   No problem-specific Assessment & Plan notes found for this encounter.   See Encounters Tab for problem based charting.  Patient discussed with Dr. Manual Meier  Humphrey Rolls, MD [facility] Rung. [facility] Internal Medicine Residency, PGY-2   Obstructive jaundice 2/2 lesion near head of pancreas Conjugated hyperbilirubinemia Elevated LFTs and alk phos Nausea and vomiting Ensure follow-up with interventional radiology, refer to oncology once biopsy results are reported. Follow-up biopsy results. Recheck CMP.   Hypokalemia, resolved Abnormal EKG Recheck CMP.   COPD Tobacco use disorder Pulmonary nodule Ensure 68-monthfollow up low-dose CT for monitoring of RUL lung nodule. Continue to encourage cessation.

## 2022-06-09 NOTE — Telephone Encounter (Signed)
I saw the patient missed her appointment today so I gave her a call and she told me that she forgot that she had an appointment with [place] today. She does have an appointment with oncology tomorrow morning so I reminded her of the appointment. She would prefer to see [place] moving forward so I advised her to call the office tomorrow to schedule an appointment for sometime next week.

## 2022-06-10 ENCOUNTER — Inpatient Hospital Stay (HOSPITAL_COMMUNITY): Payer: 59

## 2022-06-10 ENCOUNTER — Encounter (HOSPITAL_COMMUNITY): Payer: Self-pay | Admitting: Internal Medicine

## 2022-06-10 ENCOUNTER — Other Ambulatory Visit: Payer: Self-pay

## 2022-06-10 ENCOUNTER — Inpatient Hospital Stay: Payer: 59

## 2022-06-10 ENCOUNTER — Emergency Department (HOSPITAL_COMMUNITY): Payer: 59

## 2022-06-10 ENCOUNTER — Telehealth: Payer: Self-pay

## 2022-06-10 ENCOUNTER — Inpatient Hospital Stay: Payer: 59 | Attending: Physician Assistant | Admitting: Physician Assistant

## 2022-06-10 ENCOUNTER — Inpatient Hospital Stay (HOSPITAL_COMMUNITY)
Admission: EM | Admit: 2022-06-10 | Discharge: 2022-06-13 | DRG: 682 | Disposition: A | Discharge status: Home / Self Care | Payer: 59 | Attending: Internal Medicine | Admitting: Internal Medicine

## 2022-06-10 VITALS — BP 59/51 | HR 86 | Temp 97.5°F | Resp 15 | Ht 64.0 in | Wt 144.9 lb

## 2022-06-10 DIAGNOSIS — I2489 Other forms of acute ischemic heart disease: Secondary | ICD-10-CM | POA: Diagnosis present

## 2022-06-10 DIAGNOSIS — R627 Adult failure to thrive: Secondary | ICD-10-CM | POA: Diagnosis present

## 2022-06-10 DIAGNOSIS — E876 Hypokalemia: Secondary | ICD-10-CM | POA: Diagnosis present

## 2022-06-10 DIAGNOSIS — Z823 Family history of stroke: Secondary | ICD-10-CM | POA: Diagnosis not present

## 2022-06-10 DIAGNOSIS — Z79899 Other long term (current) drug therapy: Secondary | ICD-10-CM | POA: Insufficient documentation

## 2022-06-10 DIAGNOSIS — E860 Dehydration: Secondary | ICD-10-CM | POA: Diagnosis present

## 2022-06-10 DIAGNOSIS — E872 Acidosis, unspecified: Secondary | ICD-10-CM | POA: Diagnosis present

## 2022-06-10 DIAGNOSIS — Z8249 Family history of ischemic heart disease and other diseases of the circulatory system: Secondary | ICD-10-CM

## 2022-06-10 DIAGNOSIS — K831 Obstruction of bile duct: Secondary | ICD-10-CM | POA: Diagnosis present

## 2022-06-10 DIAGNOSIS — Z8541 Personal history of malignant neoplasm of cervix uteri: Secondary | ICD-10-CM

## 2022-06-10 DIAGNOSIS — Z830 Family history of human immunodeficiency virus [HIV] disease: Secondary | ICD-10-CM | POA: Diagnosis not present

## 2022-06-10 DIAGNOSIS — C250 Malignant neoplasm of head of pancreas: Secondary | ICD-10-CM

## 2022-06-10 DIAGNOSIS — I9589 Other hypotension: Secondary | ICD-10-CM

## 2022-06-10 DIAGNOSIS — I959 Hypotension, unspecified: Secondary | ICD-10-CM | POA: Diagnosis present

## 2022-06-10 DIAGNOSIS — E440 Moderate protein-calorie malnutrition: Secondary | ICD-10-CM

## 2022-06-10 DIAGNOSIS — Z1152 Encounter for screening for COVID-19: Secondary | ICD-10-CM | POA: Diagnosis not present

## 2022-06-10 DIAGNOSIS — C259 Malignant neoplasm of pancreas, unspecified: Secondary | ICD-10-CM | POA: Diagnosis present

## 2022-06-10 DIAGNOSIS — E861 Hypovolemia: Secondary | ICD-10-CM | POA: Diagnosis present

## 2022-06-10 DIAGNOSIS — R7989 Other specified abnormal findings of blood chemistry: Secondary | ICD-10-CM

## 2022-06-10 DIAGNOSIS — F17210 Nicotine dependence, cigarettes, uncomplicated: Secondary | ICD-10-CM | POA: Diagnosis present

## 2022-06-10 DIAGNOSIS — I10 Essential (primary) hypertension: Secondary | ICD-10-CM | POA: Diagnosis present

## 2022-06-10 DIAGNOSIS — Z800 Family history of malignant neoplasm of digestive organs: Secondary | ICD-10-CM | POA: Insufficient documentation

## 2022-06-10 DIAGNOSIS — R933 Abnormal findings on diagnostic imaging of other parts of digestive tract: Secondary | ICD-10-CM | POA: Diagnosis present

## 2022-06-10 DIAGNOSIS — Z833 Family history of diabetes mellitus: Secondary | ICD-10-CM | POA: Diagnosis not present

## 2022-06-10 DIAGNOSIS — E871 Hypo-osmolality and hyponatremia: Secondary | ICD-10-CM | POA: Diagnosis present

## 2022-06-10 DIAGNOSIS — N179 Acute kidney failure, unspecified: Secondary | ICD-10-CM | POA: Diagnosis not present

## 2022-06-10 LAB — CBC WITH DIFFERENTIAL/PLATELET
Abs Immature Granulocytes: 0.4 10*3/uL — ABNORMAL HIGH (ref 0.00–0.07)
Basophils Absolute: 0 10*3/uL (ref 0.0–0.1)
Basophils Relative: 0 %
Eosinophils Absolute: 0 10*3/uL (ref 0.0–0.5)
Eosinophils Relative: 0 %
HCT: 34.4 % — ABNORMAL LOW (ref 36.0–46.0)
Hemoglobin: 12.1 g/dL (ref 12.0–15.0)
Immature Granulocytes: 2 %
Lymphocytes Relative: 14 %
Lymphs Abs: 2.4 10*3/uL (ref 0.7–4.0)
MCH: 30 pg (ref 26.0–34.0)
MCHC: 35.2 g/dL (ref 30.0–36.0)
MCV: 85.1 fL (ref 80.0–100.0)
Monocytes Absolute: 0.8 10*3/uL (ref 0.1–1.0)
Monocytes Relative: 5 %
Neutro Abs: 13.2 10*3/uL — ABNORMAL HIGH (ref 1.7–7.7)
Neutrophils Relative %: 79 %
Platelets: 514 10*3/uL — ABNORMAL HIGH (ref 150–400)
RBC: 4.04 MIL/uL (ref 3.87–5.11)
RDW: 14.1 % (ref 11.5–15.5)
WBC: 16.9 10*3/uL — ABNORMAL HIGH (ref 4.0–10.5)
nRBC: 0 % (ref 0.0–0.2)

## 2022-06-10 LAB — COMPREHENSIVE METABOLIC PANEL
ALT: 75 U/L — ABNORMAL HIGH (ref 0–44)
AST: 48 U/L — ABNORMAL HIGH (ref 15–41)
Albumin: 3.2 g/dL — ABNORMAL LOW (ref 3.5–5.0)
Alkaline Phosphatase: 270 U/L — ABNORMAL HIGH (ref 38–126)
Anion gap: >20 — ABNORMAL HIGH (ref 5–15)
BUN: 167 mg/dL — ABNORMAL HIGH (ref 6–20)
CO2: 16 mmol/L — ABNORMAL LOW (ref 22–32)
Calcium: 8.5 mg/dL — ABNORMAL LOW (ref 8.9–10.3)
Chloride: 76 mmol/L — ABNORMAL LOW (ref 98–111)
Creatinine, Ser: 14.38 mg/dL — ABNORMAL HIGH (ref 0.44–1.00)
GFR, Estimated: 3 mL/min — ABNORMAL LOW (ref >60)
Glucose, Bld: 135 mg/dL — ABNORMAL HIGH (ref 70–99)
Potassium: 4.9 mmol/L (ref 3.5–5.1)
Sodium: 124 mmol/L — ABNORMAL LOW (ref 135–145)
Total Bilirubin: 5.7 mg/dL — ABNORMAL HIGH (ref 0.3–1.2)
Total Protein: 8.5 g/dL — ABNORMAL HIGH (ref 6.5–8.1)

## 2022-06-10 LAB — TROPONIN I (HIGH SENSITIVITY)
Troponin I (High Sensitivity): 49 ng/L — ABNORMAL HIGH (ref <18)
Troponin I (High Sensitivity): 42 ng/L — ABNORMAL HIGH (ref <18)

## 2022-06-10 LAB — PROTIME-INR
INR: 1.4 — ABNORMAL HIGH (ref 0.8–1.2)
Prothrombin Time: 16.8 seconds — ABNORMAL HIGH (ref 11.4–15.2)

## 2022-06-10 LAB — ECHOCARDIOGRAM COMPLETE
AR max vel: 3.9 cm2
AV Area VTI: 4.54 cm2
AV Area mean vel: 4.21 cm2
AV Mean grad: 5 mmHg
AV Peak grad: 11.4 mmHg
Ao pk vel: 1.69 m/s
Area-P 1/2: 2.66 cm2
Height: 60 in
S' Lateral: 2.2 cm
Weight: 2318.39 oz

## 2022-06-10 LAB — BASIC METABOLIC PANEL
Anion gap: >20 — ABNORMAL HIGH (ref 5–15)
BUN: 137 mg/dL — ABNORMAL HIGH (ref 6–20)
CO2: 16 mmol/L — ABNORMAL LOW (ref 22–32)
Calcium: 7.2 mg/dL — ABNORMAL LOW (ref 8.9–10.3)
Chloride: 84 mmol/L — ABNORMAL LOW (ref 98–111)
Creatinine, Ser: 12.44 mg/dL — ABNORMAL HIGH (ref 0.44–1.00)
GFR, Estimated: 3 mL/min — ABNORMAL LOW (ref >60)
Glucose, Bld: 97 mg/dL (ref 70–99)
Potassium: 4.6 mmol/L (ref 3.5–5.1)
Sodium: 125 mmol/L — ABNORMAL LOW (ref 135–145)

## 2022-06-10 LAB — RESP PANEL BY RT-PCR (RSV, FLU A&B, COVID)  RVPGX2
Influenza A by PCR: NEGATIVE
Influenza B by PCR: NEGATIVE
Resp Syncytial Virus by PCR: NEGATIVE
SARS Coronavirus 2 by RT PCR: NEGATIVE

## 2022-06-10 LAB — LACTIC ACID, PLASMA
Lactic Acid, Venous: 1.6 mmol/L (ref 0.5–1.9)
Lactic Acid, Venous: 2.5 mmol/L (ref 0.5–1.9)

## 2022-06-10 LAB — APTT: aPTT: 42 seconds — ABNORMAL HIGH (ref 24–36)

## 2022-06-10 MED ORDER — HEPARIN SODIUM (PORCINE) 5000 UNIT/ML IJ SOLN
5000.0000 [IU] | Freq: Two times a day (BID) | INTRAMUSCULAR | Status: DC
  Administered 2022-06-10 – 2022-06-12 (×6): 5000 [IU] via SUBCUTANEOUS
  Filled 2022-06-10 (×7): qty 1

## 2022-06-10 MED ORDER — SODIUM CHLORIDE 0.9 % IV SOLN
2.0000 g | Freq: Once | INTRAVENOUS | Status: AC
  Administered 2022-06-10: 2 g via INTRAVENOUS
  Filled 2022-06-10: qty 12.5

## 2022-06-10 MED ORDER — METRONIDAZOLE 500 MG/100ML IV SOLN
500.0000 mg | Freq: Once | INTRAVENOUS | Status: AC
  Administered 2022-06-10: 500 mg via INTRAVENOUS
  Filled 2022-06-10: qty 100

## 2022-06-10 MED ORDER — SODIUM CHLORIDE 0.9 % IV SOLN
1.0000 g | INTRAVENOUS | Status: DC
  Administered 2022-06-10: 1 g via INTRAVENOUS
  Filled 2022-06-10: qty 20

## 2022-06-10 MED ORDER — STERILE WATER FOR INJECTION IV SOLN
INTRAVENOUS | Status: DC
  Filled 2022-06-10 (×3): qty 150
  Filled 2022-06-10: qty 1000

## 2022-06-10 MED ORDER — LACTATED RINGERS IV BOLUS (SEPSIS)
1000.0000 mL | Freq: Once | INTRAVENOUS | Status: AC
  Administered 2022-06-10: 1000 mL via INTRAVENOUS

## 2022-06-10 MED ORDER — LACTATED RINGERS IV BOLUS
1000.0000 mL | Freq: Once | INTRAVENOUS | Status: AC
  Administered 2022-06-10: 1000 mL via INTRAVENOUS

## 2022-06-10 MED ORDER — ORAL CARE MOUTH RINSE
15.0000 mL | OROMUCOSAL | Status: DC | PRN
  Administered 2022-06-11: 15 mL via OROMUCOSAL

## 2022-06-10 MED ORDER — SODIUM CHLORIDE 0.9 % IV BOLUS: 1500.0000 mL | Freq: Once | INTRAVENOUS | Status: DC

## 2022-06-10 MED ORDER — LACTATED RINGERS IV SOLN: INTRAVENOUS | Status: DC

## 2022-06-10 MED ORDER — SODIUM CHLORIDE 0.9 % IV BOLUS
1500.0000 mL | Freq: Once | INTRAVENOUS | Status: AC
  Administered 2022-06-10: 1500 mL via INTRAVENOUS

## 2022-06-10 NOTE — Telephone Encounter (Signed)
This nurse received a return call from this patients sister stated that she spoke with patient and the friend and she is aware.  States that she will be coming in town in the morning.  No further questions or concerns noted at this time.

## 2022-06-10 NOTE — ED Notes (Signed)
Dr. Wyvonnia Dusky at bedside. Pt is to remain NPO until after CT scan results

## 2022-06-10 NOTE — ED Provider Notes (Signed)
[place] EMERGENCY DEPARTMENT AT [facility] Provider Note   CSN: [id number] Arrival date & time: 06/10/22  [id number]     History  No chief complaint on file.   Michelle Williams is a 62 y.o. female.  62 y.o. female with a past medical history with a past medical history significant for hypertension, degenerative disc disease, allergies, hypertension, and cholelithiasis was referred to the clinic for newly diagnosed pancreatic cancer.  Sent from cancer center today with hypotension in the 75s.  She was having routine follow-up visit.  States she was discharged in the hospital about 1 week ago and is felt dizzy and lightheaded since.  Has had intermittent vomiting every day but cannot quantify how much.  Continues to take her blood pressure medication.  She feels dizzy and lightheaded but denies any chest pain or shortness of breath.  No known fever.  No diarrhea.  Denies any significant abdominal pain or chest pain.  She has a biliary drain in place and has had undergone ERCP and MRCP for diagnosis of her pancreatic mass.  She has not yet started chemotherapy or radiation. She has had decreased p.o. intake and urinary output for the past 1 week ever since she went home.  Did have 2 falls over the past week where she think she hit her head but did not lose consciousness.  Does not take any blood thinners.  The history is provided by the patient.       Home Medications Prior to Admission medications   Medication Sig Start Date End Date Taking? Authorizing Provider  albuterol (VENTOLIN HFA) 108 (90 Base) MCG/ACT inhaler Inhale into the lungs. 02/25/22   [provider]  cyclobenzaprine (FLEXERIL) 10 MG tablet Take 10 mg by mouth at bedtime. 12/12/16   [provider]  fluticasone (FLONASE) 50 MCG/ACT nasal spray Place 1 spray into both nostrils daily. Patient taking differently: Place 1 spray into both nostrils daily as needed for allergies. 02/19/22   Varney Biles, MD  levocetirizine (XYZAL) 5 MG tablet Take 5 mg by mouth at bedtime.    [provider]  lisinopril (ZESTRIL) 10 MG tablet Take 1 tablet (10 mg total) by mouth daily. 05/06/22   Jettie Booze, MD  montelukast (SINGULAIR) 10 MG tablet Take 10 mg by mouth at bedtime.    [provider]  ondansetron (ZOFRAN) 4 MG tablet Take 1 tablet (4 mg total) by mouth daily as needed for nausea or vomiting. 05/28/22 05/28/23  Farrel Gordon, DO  traZODone (DESYREL) 100 MG tablet Take 100 mg by mouth at bedtime. 03/28/22   [provider]  Varenicline Tartrate, Starter, (CHANTIX STARTING MONTH PAK) 0.5 MG X 11 & 1 MG X 42 TBPK Days 1 to 3: 0.5 mg once daily. Days 4 to 7: 0.5 mg twice daily. Maintenance (day 8 and later): 1 mg twice daily Patient taking differently: Take 0.5 mg by mouth 2 (two) times daily. 05/06/22   Jettie Booze, MD      Allergies    Patient has no known allergies.    Review of Systems   Review of Systems  Constitutional:  Positive for activity change, appetite change and fatigue. Negative for fever.  HENT:  Negative for congestion.   Respiratory:  Negative for cough, chest tightness and shortness of breath.   Cardiovascular:  Negative for chest pain.  Gastrointestinal:  Positive for abdominal pain, nausea and vomiting.  Genitourinary:  Negative for dysuria and hematuria.  Musculoskeletal:  Negative for arthralgias and myalgias.  Neurological:  Positive for dizziness, weakness and light-headedness.   all other systems are negative except as noted in the HPI and PMH.    Physical Exam Updated Vital Signs BP (!) 76/54 (BP Location: Right Arm)   Pulse 78   Temp 97.6 F (36.4 C) (Oral)   Resp 19   Ht 5' (1.524 m)   Wt 65.7 kg   SpO2 99%   BMI 28.30 kg/m  Physical Exam Vitals and nursing note reviewed.  Constitutional:      General: She is in acute distress.     Appearance: She is well-developed. She is ill-appearing.     Comments:  Chronic ill-appearing, fatigue, jaundice  HENT:     Head: Normocephalic and atraumatic.     Mouth/Throat:     Pharynx: No oropharyngeal exudate.  Eyes:     Conjunctiva/sclera: Conjunctivae normal.     Pupils: Pupils are equal, round, and reactive to light.  Neck:     Comments: No meningismus. Cardiovascular:     Rate and Rhythm: Normal rate and regular rhythm.     Heart sounds: Normal heart sounds. No murmur heard. Pulmonary:     Effort: Pulmonary effort is normal. No respiratory distress.     Breath sounds: Normal breath sounds.  Chest:     Chest wall: No tenderness.  Abdominal:     Palpations: Abdomen is soft.     Tenderness: There is abdominal tenderness. There is no guarding or rebound.     Comments: Biliary drain in place, draining brown liquid  Musculoskeletal:        General: No tenderness. Normal range of motion.     Cervical back: Normal range of motion and neck supple.     Right lower leg: No edema.     Left lower leg: No edema.  Skin:    General: Skin is warm.     Coloration: Skin is not jaundiced.  Neurological:     Mental Status: She is alert and oriented to person, place, and time.     Cranial Nerves: No cranial nerve deficit.     Motor: No abnormal muscle tone.     Coordination: Coordination normal.     Comments:  5/5 strength throughout. CN 2-12 intact.Equal grip strength.   Psychiatric:        Behavior: Behavior normal.     ED Results / Procedures / Treatments   Labs (all labs ordered are listed, but only abnormal results are displayed) Labs Reviewed  COMPREHENSIVE METABOLIC PANEL - Abnormal; Notable for the following components:      Result Value   Sodium 124 (*)    Chloride 76 (*)    CO2 16 (*)    Glucose, Bld 135 (*)    BUN 167 (*)    Creatinine, Ser 14.38 (*)    Calcium 8.5 (*)    Total Protein 8.5 (*)    Albumin 3.2 (*)    AST 48 (*)    ALT 75 (*)    Alkaline Phosphatase 270 (*)    Total Bilirubin 5.7 (*)    GFR, Estimated 3 (*)     Anion gap >20 (*)    All other components within normal limits  CBC WITH DIFFERENTIAL/PLATELET - Abnormal; Notable for the following components:   WBC 16.9 (*)    HCT 34.4 (*)    Platelets 514 (*)    Neutro Abs 13.2 (*)    Abs Immature Granulocytes 0.40 (*)  All other components within normal limits  PROTIME-INR - Abnormal; Notable for the following components:   Prothrombin Time 16.8 (*)    INR 1.4 (*)    All other components within normal limits  APTT - Abnormal; Notable for the following components:   aPTT 42 (*)    All other components within normal limits  TROPONIN I (HIGH SENSITIVITY) - Abnormal; Notable for the following components:   Troponin I (High Sensitivity) 49 (*)    All other components within normal limits  TROPONIN I (HIGH SENSITIVITY) - Abnormal; Notable for the following components:   Troponin I (High Sensitivity) 42 (*)    All other components within normal limits  RESP PANEL BY RT-PCR (RSV, FLU A&B, COVID)  RVPGX2  CULTURE, BLOOD (ROUTINE X 2)  CULTURE, BLOOD (ROUTINE X 2)  LACTIC ACID, PLASMA  LACTIC ACID, PLASMA  URINALYSIS, W/ REFLEX TO CULTURE (INFECTION SUSPECTED)  BASIC METABOLIC PANEL    EKG EKG Interpretation  Date/Time:  Friday June 10 2022 11:22:15 EST Ventricular Rate:  74 PR Interval:  164 QRS Duration: 132 QT Interval:  432 QTC Calculation: 480 R Axis:   27 Text Interpretation: Sinus rhythm Nonspecific intraventricular conduction delay Nonspecific T abnormalities, anterior leads No significant change was found Confirmed by Ezequiel Essex [phone]) on 06/10/2022 11:55:52 AM  Radiology CT ABDOMEN PELVIS WO CONTRAST  Result Date: 06/10/2022 CLINICAL DATA:  Acute abdominal pain, pancreatic mass, biliary drain, new renal failure, weakness, nausea, vomiting EXAM: CT ABDOMEN AND PELVIS WITHOUT CONTRAST TECHNIQUE: Multidetector CT imaging of the abdomen and pelvis was performed following the standard protocol without IV contrast. RADIATION  DOSE REDUCTION: This exam was performed according to the departmental dose-optimization program which includes automated exposure control, adjustment of the mA and/or kV according to patient size and/or use of iterative reconstruction technique. COMPARISON:  Of 2024 FINDINGS: Lower chest: Dependent bibasilar atelectasis and lower lobe bronchiectasis Hepatobiliary: Percutaneous biliary drain traverses RIGHT lobe liver and CBD into duodenum, pigtail catheter at third portion. Gallbladder decompressed. No focal hepatic mass. Pancreas: Ill-defined pancreatic head. Remainder of pancreas unremarkable. Spleen: Normal appearance Adrenals/Urinary Tract: Adrenal glands, kidneys, ureters, and bladder normal appearance Stomach/Bowel: Increased stool in rectum. Scattered stool in remainder of colon. Stomach unremarkable. Infiltrative changes adjacent to duodenum. Remaining bowel loops unremarkable. Vascular/Lymphatic: Atherosclerotic calcification aorta and iliac arteries. Few pelvic phleboliths. Reproductive: Unremarkable uterus and adnexa Other: No free air or free fluid. New diffuse infiltrative process identified within the RIGHT upper quadrant, posterior and medial to the hepatic flexure of the colon, anterolateral to the pancreas, approximately 11.0 x 6.8 cm in size, 8.5 cm length, ill-defined. Mild associated wall thickening of the hepatic flexure of the colon with anterolateral colonic displacement. Fat within this region on the previous CT was clear. Musculoskeletal: No acute osseous findings. IMPRESSION: New diffuse infiltrative process within the RIGHT upper quadrant, posterior and medial to the hepatic flexure of the colon, anterolateral to the pancreas, approximately 11.0 x 6.8 x 8.5 cm in size, with mild associated wall thickening of the hepatic flexure of the colon with anterolateral colonic displacement. Fat within this region on the previous exam was clear. Differential diagnosis includes hemorrhage, infection,  or marked tumor progression, atypical for tumor progression due to the short interval since previous CT. Recommend correlation with hemoglobin and white count. Electronically Signed   By: Lavonia Dana M.D.   On: 06/10/2022 13:16   DG Chest Port 1 View  Result Date: 06/10/2022 CLINICAL DATA:  Questionable sepsis. EXAM: PORTABLE CHEST  1 VIEW COMPARISON:  02/19/2022. FINDINGS: 0933 hours. Low lung volumes accentuate the pulmonary vasculature and cardiomediastinal silhouette. No focal airspace opacity. Otherwise stable cardiac and mediastinal contours. No pleural effusion or pneumothorax. IMPRESSION: Low lung volumes without evidence of acute cardiopulmonary disease. Electronically Signed   By: Emmit Alexanders M.D.   On: 06/10/2022 09:38    Procedures .Critical Care  Performed by: Ezequiel Essex, MD Authorized by: Ezequiel Essex, MD   Critical care provider statement:    Critical care time (minutes):  45   Critical care time was exclusive of:  Separately billable procedures and treating other patients   Critical care was necessary to treat or prevent imminent or life-threatening deterioration of the following conditions:  Renal failure and shock   Critical care was time spent personally by me on the following activities:  Development of treatment plan with patient or surrogate, discussions with consultants, evaluation of patient's response to treatment, examination of patient, ordering and review of laboratory studies, ordering and review of radiographic studies, ordering and performing treatments and interventions, pulse oximetry, re-evaluation of patient's condition, review of old charts and blood draw for specimens   I assumed direction of critical care for this patient from another provider in my specialty: no     Care discussed with: admitting provider       Medications Ordered in ED Medications  lactated ringers infusion (has no administration in time range)  lactated ringers bolus 1,000  mL (has no administration in time range)  ceFEPIme (MAXIPIME) 2 g in sodium chloride 0.9 % 100 mL IVPB (has no administration in time range)  metroNIDAZOLE (FLAGYL) IVPB 500 mg (has no administration in time range)    ED Course/ Medical Decision Making/ A&P                             Medical Decision Making Amount and/or Complexity of Data Reviewed Labs: ordered. Decision-making details documented in ED Course. Radiology: ordered and independent interpretation performed. Decision-making details documented in ED Course. ECG/medicine tests: ordered and independent interpretation performed. Decision-making details documented in ED Course.  Risk Prescription drug management. Decision regarding hospitalization.  Pancreatic cancer here with hypotension, fatigue and dizziness.  Blood pressure 76/54 on arrival.  No fever.  Denies any significant abdominal pain.  Dehydration versus sepsis.  Patient started on broad-spectrum antibiotics and IV fluids after cultures are obtained  Blood pressure has improved to the 90s to 100s after 3 L of IV fluids.  Suspect profound dehydration and hypovolemia.  Labs consistent with acute renal failure, BUN 167, creatinine 14.  These numbers were normal in January.  Sodium 124, bicarb 16.  Potassium is 4.9 and normal.  No acute EKG changes.  Will initiate bicarb infusion.  Discussed with nephrology Dr. Jonnie Finner who will see patient who believes she can stay at [facility] long at this time.  No emergent indication for dialysis.  CT scan to be obtained to rule out obstruction.  CT scan shows new fluid collection in RUQ, tumor versus infection, versus hemorrhage?  Hemoglobin is stable.  Uncertain etiology of CT findings but consider infiltrative tumor.   Admission for AKI d/w Dr. Feliberto Gottron. He is consulting IR for potential drainage of fluid collection.        Final Clinical Impression(s) / ED Diagnoses Final diagnoses:  None    Rx / DC Orders ED Discharge  Orders     None  Ezequiel Essex, MD 06/10/22 1721

## 2022-06-10 NOTE — ED Triage Notes (Signed)
Patient brought over from [facility] via wheelchair. Weak on arrival, and has low BP in 50s. Patient has had N/V and not voiding.

## 2022-06-10 NOTE — Sepsis Progress Note (Signed)
Sepsis protocol is being followed by [facility]. 

## 2022-06-10 NOTE — ED Notes (Signed)
Pt to ct via stretcher

## 2022-06-10 NOTE — ED Notes (Signed)
Pt requesting to eat. Dr. Wyvonnia Dusky notified to see if pt is able to eat

## 2022-06-10 NOTE — Progress Notes (Signed)
Report received from A.Arlee Santosuosso,RN. No change in assessment. Continue plan of care. Shenique Childers, Laurel Dimmer, RN

## 2022-06-10 NOTE — Progress Notes (Signed)
PHARMACY NOTE:  ANTIMICROBIAL RENAL DOSAGE ADJUSTMENT  Current antimicrobial regimen includes a mismatch between antimicrobial dosage and estimated renal function.  As per policy approved by the Pharmacy & Therapeutics and Medical Executive Committees, the antimicrobial dosage will be adjusted accordingly.  Current antimicrobial dosage:  Meropenem 516m IV q12h  Indication: intra-abdominal infection  Renal Function:  Estimated Creatinine Clearance: 4.1 mL/min (A) (by C-G formula based on SCr of 12.44 mg/dL (H)). []$      On intermittent HD, scheduled: []$      On CRRT    Antimicrobial dosage has been changed to:  1g IV q24h  Additional comments:   Thank you for allowing pharmacy to be a part of this patient's care.  EPeggyann Juba PharmD, BCPS Pharmacy: [phone]/01/2023 1:29 PM

## 2022-06-10 NOTE — Consult Note (Signed)
Renal Service Consult Note [facility]  Michelle Williams 06/10/2022 Sol Blazing, MD Requesting Physician: Dr. Feliberto Gottron  Reason for Consult: Renal failure   HPI: The patient is a 62 y.o. year-old w/ PMH as below who presented early today to ED from [facility] for gen'd weakness and low BP in the 37s. Pt had been vomiting at home and not voiding. During recent admit 1/20- 05/28/22 pt had jaundice and dilated biliary ducts up to the head of the pancreas. Brushings were taken during ERCP and results pending for question of pancreatic cancer. At home patient has had significant N/V, unable to keep down food or liquids. In ED BP 80s and now after 3 L bolus IVF"s the BP is in the low 100s. Creat is 14 (was 0.74 a few weeks ago here. She is on ACEi at home. Pt being admitted. We are asked to see for renal failure.   Pt seen in CT scan room. Pt confirms sig N/V, no diarrhea, no SOB or cough or CP.  Has not been voiding much. No hx kidney problems.   ROS - denies CP, no joint pain, no HA, no blurry vision, no rash, no dysuria, no difficulty voiding   Past Medical History  Past Medical History:  Diagnosis Date   Acute bronchitis    Allergy    Anemia    Anxiety    Arthritis    Cataract    BEGINIING   Cervical cancer ([place])    Degenerative joint disease    UPPER,LOWER BACK   Gallstones    H/O degenerative disc disease    H/O rheumatoid arthritis    Hypertension    Neuropathy    BILATERAL   Past Surgical History  Past Surgical History:  Procedure Laterality Date   BREAST EXCISIONAL BIOPSY Right    COLONOSCOPY     2018   ERCP N/A 05/23/2022   Procedure: ENDOSCOPIC RETROGRADE CHOLANGIOPANCREATOGRAPHY (ERCP);  Surgeon: Ladene Artist, MD;  Location: [place];  Service: Gastroenterology;  Laterality: N/A;   IR BILIARY DRAIN PLACEMENT WITH CHOLANGIOGRAM  05/25/2022   IR ENDOLUMINAL BX OF BILIARY TREE  05/30/2022   IR FALLOPIAN TUBE CATHETERIZATION     IR INT EXT BILIARY  DRAIN WITH CHOLANGIOGRAM  05/27/2022   POLYPECTOMY     Family History  Family History  Problem Relation Age of Onset   Hypertension Mother    Stroke Mother    Early death Father    Colon cancer Brother        unsure age when diagnosed    HIV/AIDS Brother    Diabetes Maternal Grandmother    Colon polyps Neg Hx    Esophageal cancer Neg Hx    Rectal cancer Neg Hx    Stomach cancer Neg Hx    Crohn's disease Neg Hx    Ulcerative colitis Neg Hx    Social History  reports that she has been smoking cigarettes. She has been smoking an average of .5 packs per day. She has never been exposed to tobacco smoke. She has never used smokeless tobacco. She reports that she does not currently use alcohol. She reports that she does not use drugs. Allergies No Known Allergies Home medications Prior to Admission medications   Medication Sig Start Date End Date Taking? Authorizing Provider  albuterol (VENTOLIN HFA) 108 (90 Base) MCG/ACT inhaler Inhale into the lungs. 02/25/22   [provider]  cyclobenzaprine (FLEXERIL) 10 MG tablet Take 10 mg by mouth at bedtime.  12/12/16   [provider]  fluticasone (FLONASE) 50 MCG/ACT nasal spray Place 1 spray into both nostrils daily. Patient taking differently: Place 1 spray into both nostrils daily as needed for allergies. 02/19/22   Varney Biles, MD  levocetirizine (XYZAL) 5 MG tablet Take 5 mg by mouth at bedtime.    [provider]  lisinopril (ZESTRIL) 10 MG tablet Take 1 tablet (10 mg total) by mouth daily. 05/06/22   Jettie Booze, MD  montelukast (SINGULAIR) 10 MG tablet Take 10 mg by mouth at bedtime.    [provider]  ondansetron (ZOFRAN) 4 MG tablet Take 1 tablet (4 mg total) by mouth daily as needed for nausea or vomiting. 05/28/22 05/28/23  Farrel Gordon, DO  traZODone (DESYREL) 100 MG tablet Take 100 mg by mouth at bedtime. 03/28/22   [provider]  Varenicline Tartrate, Starter, (CHANTIX STARTING  MONTH PAK) 0.5 MG X 11 & 1 MG X 42 TBPK Days 1 to 3: 0.5 mg once daily. Days 4 to 7: 0.5 mg twice daily. Maintenance (day 8 and later): 1 mg twice daily Patient taking differently: Take 0.5 mg by mouth 2 (two) times daily. 05/06/22   Jettie Booze, MD     Vitals:   06/10/22 1200 06/10/22 1210 06/10/22 1255 06/10/22 1325  BP: 109/77 110/66 117/68   Pulse: 82 78 81   Resp: 20 16 12   $ Temp:    (!) 97.5 F (36.4 C)  TempSrc:    Oral  SpO2: 97% 100% 99%   Weight:      Height:       Exam Gen alert, no distress, adult AAF No rash, cyanosis or gangrene Sclera anicteric, throat clear w/ dry mouth No jvd or bruits Chest clear bilat to bases, no rales/ wheezing RRR no MRG Abd soft ntnd no mass or ascites +bs GU defer MS no joint effusions or deformity Ext no LE or UE edema, no wounds or ulcers Neuro is alert, Ox 3 , nf, mild asterixis    Home meds include - albuterol, lisinopril, singulair, trazodone, chantix, prns     UA - pend    UNa, UCr - pend    Renal US - pend    Na 124, CO2 16, AG >20  , BUN 167 creat 14.3, Ca 8.5  alb 3.2  AST 48     Tbili 5.7  wBC 16K Hb 12    Assessment/ Plan: AKI - normal creat recently was 0.74 in late Jan 2024. Pt was just in hospital for w/u of obstructive jaundice and they are awaiting brushings from the ERCP. BP's were in the 70s initially and are improving after 3 L bolus in ED. AKI due to hypotension/ vol depletion + ACEi effects. Still looks dry on exam, will rebolus 1.5 L and would cont IVF"s (bicarb gtt) at 150 cc/hr for now. Place foley w/ advanced renal failure. Will follow.  Hyponatremia - cont isotonic fluids, due to hypovolemia Metabolic acidosis - typical for sig renal failure, getting bicarb IVF's 150 cc/hr Jaundice - recent admit w/ ERCP pending, concern for poss panc or biliary Ca      Michelle Splinter  MD [facility] 06/10/2022, 1:26 PM  Recent Labs  Lab 06/10/22 0927 06/10/22 1121  HGB 12.1  --   ALBUMIN 3.2*  --   CALCIUM 8.5*  7.2*  CREATININE 14.38* 12.44*  K 4.9 4.6   Inpatient medications:   sodium bicarbonate 150 mEq in sterile water 1,150 mL infusion  75 mL/hr at 06/10/22 1317

## 2022-06-10 NOTE — Progress Notes (Signed)
Request received to review RUQ fluid collection. Dr. Kathlene Cote reviewed imaging and states that there is no abscess, only inflammatory phlegmon. Dr. Kathlene Cote recommends repeat CT with IV and PO contrast.  Ordering provider notified of findings and recommendation.     Narda Rutherford, AGNP-BC 06/10/2022, 2:07 PM

## 2022-06-10 NOTE — Telephone Encounter (Signed)
This nurse reached out to this patient's sister related to patient being taken to the emergency department for evaluation.  Left a message for patient to give a call back to the clinic.  No further concerns noted at this time.

## 2022-06-10 NOTE — ED Notes (Signed)
Pt on bedpan for BM

## 2022-06-10 NOTE — ED Notes (Signed)
ED TO INPATIENT HANDOFF REPORT  ED Nurse Name and Phone #: Renette Butters Name/Age/Gender Michelle Williams 62 y.o. female Room/Bed: WA15/WA15  Code Status   Code Status: Prior  Home/SNF/Other Home Patient oriented to: self, place, time, and situation Is this baseline? Yes   Triage Complete: Triage complete  Chief Complaint Acute renal failure [facility]) [N17.9]  Triage Note Patient brought over from [facility] via wheelchair. Weak on arrival, and has low BP in 50s. Patient has had N/V and not voiding.    Allergies No Known Allergies  Level of Care/Admitting Diagnosis ED Disposition     ED Disposition  Admit   Condition  --   Comment  Hospital Area: [place] [[id number]]  Level of Care: Progressive [102]  Admit to Progressive based on following criteria: NEPHROLOGY stable condition requiring close monitoring for AKI, requiring Hemodialysis or Peritoneal Dialysis either from expected electrolyte imbalance, acidosis, or fluid overload that can be managed by NIPPV or high flow oxygen.  May admit patient to [facility] or [facility] if equivalent level of care is available:: Yes  Covid Evaluation: Confirmed COVID Negative  Diagnosis: Acute renal failure [facility]) [id number]  Admitting Physician: Lucienne Minks [id number]  Attending Physician: Lucienne Minks [id number]  Certification:: I certify this patient will need inpatient services for at least 2 midnights  Estimated Length of Stay: 4          B Medical/Surgery History Past Medical History:  Diagnosis Date   Acute bronchitis    Allergy    Anemia    Anxiety    Arthritis    Cataract    BEGINIING   Cervical cancer ([place])    Degenerative joint disease    UPPER,LOWER BACK   Gallstones    H/O degenerative disc disease    H/O rheumatoid arthritis    Hypertension    Neuropathy    BILATERAL   Past Surgical History:  Procedure Laterality Date   BREAST EXCISIONAL BIOPSY Right    COLONOSCOPY     2018    ERCP N/A 05/23/2022   Procedure: ENDOSCOPIC RETROGRADE CHOLANGIOPANCREATOGRAPHY (ERCP);  Surgeon: Ladene Artist, MD;  Location: [place];  Service: Gastroenterology;  Laterality: N/A;   IR BILIARY DRAIN PLACEMENT WITH CHOLANGIOGRAM  05/25/2022   IR ENDOLUMINAL BX OF BILIARY TREE  05/30/2022   IR FALLOPIAN TUBE CATHETERIZATION     IR INT EXT BILIARY DRAIN WITH CHOLANGIOGRAM  05/27/2022   POLYPECTOMY       A IV Location/Drains/Wounds Patient Lines/Drains/Airways Status     Active Line/Drains/Airways     Name Placement date Placement time Site Days   Peripheral IV 06/10/22 20 G 1" Left Antecubital 06/10/22  0929  Antecubital  less than 1   Biliary Tube 10.2 Fr. RUQ 05/27/22  0954  RUQ  14            Intake/Output Last 24 hours  Intake/Output Summary (Last 24 hours) at 06/10/2022 1322 Last data filed at 06/10/2022 1317 Gross per 24 hour  Intake 3200 ml  Output --  Net 3200 ml    Labs/Imaging Results for orders placed or performed during the hospital encounter of 06/10/22 (from the past 48 hour(s))  Resp panel by RT-PCR (RSV, Flu A&B, Covid) Anterior Nasal Swab     Status: None   Collection Time: 06/10/22  9:27 AM   Specimen: Anterior Nasal Swab  Result Value Ref Range   SARS Coronavirus 2 by RT PCR NEGATIVE NEGATIVE    Comment: (  NOTE) SARS-CoV-2 target nucleic acids are NOT DETECTED.  The SARS-CoV-2 RNA is generally detectable in upper respiratory specimens during the acute phase of infection. The lowest concentration of SARS-CoV-2 viral copies this assay can detect is 138 copies/mL. A negative result does not preclude SARS-Cov-2 infection and should not be used as the sole basis for treatment or other patient management decisions. A negative result may occur with  improper specimen collection/handling, submission of specimen other than nasopharyngeal swab, presence of viral mutation(s) within the areas targeted by this assay, and inadequate number of  viral copies(<138 copies/mL). A negative result must be combined with clinical observations, patient history, and epidemiological information. The expected result is Negative.  Fact Sheet for Patients:  EntrepreneurPulse.com.au  Fact Sheet for Healthcare Providers:  IncredibleEmployment.be  This test is no t yet approved or cleared by the [place] FDA and  has been authorized for detection and/or diagnosis of SARS-CoV-2 by FDA under an Emergency Use Authorization (EUA). This EUA will remain  in effect (meaning this test can be used) for the duration of the COVID-19 declaration under Section 564(b)(1) of the Act, 21 U.S.C.section 360bbb-3(b)(1), unless the authorization is terminated  or revoked sooner.       Influenza A by PCR NEGATIVE NEGATIVE   Influenza B by PCR NEGATIVE NEGATIVE    Comment: (NOTE) The Xpert Xpress SARS-CoV-2/FLU/RSV plus assay is intended as an aid in the diagnosis of influenza from Nasopharyngeal swab specimens and should not be used as a sole basis for treatment. Nasal washings and aspirates are unacceptable for Xpert Xpress SARS-CoV-2/FLU/RSV testing.  Fact Sheet for Patients: EntrepreneurPulse.com.au  Fact Sheet for Healthcare Providers: IncredibleEmployment.be  This test is not yet approved or cleared by the [place] FDA and has been authorized for detection and/or diagnosis of SARS-CoV-2 by FDA under an Emergency Use Authorization (EUA). This EUA will remain in effect (meaning this test can be used) for the duration of the COVID-19 declaration under Section 564(b)(1) of the Act, 21 U.S.C. section 360bbb-3(b)(1), unless the authorization is terminated or revoked.     Resp Syncytial Virus by PCR NEGATIVE NEGATIVE    Comment: (NOTE) Fact Sheet for Patients: EntrepreneurPulse.com.au  Fact Sheet for Healthcare  Providers: IncredibleEmployment.be  This test is not yet approved or cleared by the [place] FDA and has been authorized for detection and/or diagnosis of SARS-CoV-2 by FDA under an Emergency Use Authorization (EUA). This EUA will remain in effect (meaning this test can be used) for the duration of the COVID-19 declaration under Section 564(b)(1) of the Act, 21 U.S.C. section 360bbb-3(b)(1), unless the authorization is terminated or revoked.  Performed at [facility], [place] [address], [place], [place] [address]   Lactic acid, plasma     Status: None   Collection Time: 06/10/22  9:27 AM  Result Value Ref Range   Lactic Acid, Venous 1.6 0.5 - 1.9 mmol/L    Comment: Performed at [facility], [place] [address] [place], [place] [address]  Comprehensive metabolic panel     Status: Abnormal   Collection Time: 06/10/22  9:27 AM  Result Value Ref Range   Sodium 124 (L) 135 - 145 mmol/L   Potassium 4.9 3.5 - 5.1 mmol/L   Chloride 76 (L) 98 - 111 mmol/L   CO2 16 (L) 22 - 32 mmol/L    Comment: ELECTROLYTES REPEATED TO VERIFY   Glucose, Bld 135 (H) 70 - 99 mg/dL    Comment: Glucose reference range applies only  to samples taken after fasting for at least 8 hours.   BUN 167 (H) 6 - 20 mg/dL    Comment: RESULT CONFIRMED BY MANUAL DILUTION   Creatinine, Ser 14.38 (H) 0.44 - 1.00 mg/dL   Calcium 8.5 (L) 8.9 - 10.3 mg/dL   Total Protein 8.5 (H) 6.5 - 8.1 g/dL   Albumin 3.2 (L) 3.5 - 5.0 g/dL   AST 48 (H) 15 - 41 U/L   ALT 75 (H) 0 - 44 U/L   Alkaline Phosphatase 270 (H) 38 - 126 U/L   Total Bilirubin 5.7 (H) 0.3 - 1.2 mg/dL   GFR, Estimated 3 (L) >60 mL/min    Comment: (NOTE) Calculated using the CKD-EPI Creatinine Equation (2021)    Anion gap >20 (H) 5 - 15    Comment: Performed at [facility], [place] [address], [place], [place] [address]  CBC with Differential     Status: Abnormal   Collection Time: 06/10/22   9:27 AM  Result Value Ref Range   WBC 16.9 (H) 4.0 - 10.5 K/uL   RBC 4.04 3.87 - 5.11 MIL/uL   Hemoglobin 12.1 12.0 - 15.0 g/dL   HCT 34.4 (L) 36.0 - 46.0 %   MCV 85.1 80.0 - 100.0 fL   MCH 30.0 26.0 - 34.0 pg   MCHC 35.2 30.0 - 36.0 g/dL   RDW 14.1 11.5 - 15.5 %   Platelets 514 (H) 150 - 400 K/uL   nRBC 0.0 0.0 - 0.2 %   Neutrophils Relative % 79 %   Neutro Abs 13.2 (H) 1.7 - 7.7 K/uL   Lymphocytes Relative 14 %   Lymphs Abs 2.4 0.7 - 4.0 K/uL   Monocytes Relative 5 %   Monocytes Absolute 0.8 0.1 - 1.0 K/uL   Eosinophils Relative 0 %   Eosinophils Absolute 0.0 0.0 - 0.5 K/uL   Basophils Relative 0 %   Basophils Absolute 0.0 0.0 - 0.1 K/uL   Immature Granulocytes 2 %   Abs Immature Granulocytes 0.40 (H) 0.00 - 0.07 K/uL    Comment: Performed at [facility], [place] [address] [place], [place] [address]  Protime-INR     Status: Abnormal   Collection Time: 06/10/22  9:27 AM  Result Value Ref Range   Prothrombin Time 16.8 (H) 11.4 - 15.2 seconds   INR 1.4 (H) 0.8 - 1.2    Comment: (NOTE) INR goal varies based on device and disease states. Performed at [facility], [place] [address] [place], [place] [address]   APTT     Status: Abnormal   Collection Time: 06/10/22  9:27 AM  Result Value Ref Range   aPTT 42 (H) 24 - 36 seconds    Comment:        IF BASELINE aPTT IS ELEVATED, SUGGEST PATIENT RISK ASSESSMENT BE USED TO DETERMINE APPROPRIATE ANTICOAGULANT THERAPY. Performed at [facility], [place] [address], [place], [place] [address]   Troponin I (High Sensitivity)     Status: Abnormal   Collection Time: 06/10/22  9:27 AM  Result Value Ref Range   Troponin I (High Sensitivity) 49 (H) <18 ng/L    Comment: (NOTE) Elevated high sensitivity troponin I (hsTnI) values and significant  changes across serial measurements may suggest ACS but many other  chronic and acute conditions are known to elevate hsTnI results.  Refer to  the "Links" section for chest pain algorithms and additional  guidance. Performed at [facility], [place]  [address] [place], [place] [address]   Lactic acid, plasma     Status: Abnormal   Collection Time: 06/10/22 11:21 AM  Result Value Ref Range   Lactic Acid, Venous 2.5 (HH) 0.5 - 1.9 mmol/L    Comment: CRITICAL RESULT CALLED TO, READ BACK BY AND VERIFIED WITH KISER, C. RN AT 1249 ON 06/10/2022 BY MECIAL J. Performed at [facility], [place] [address] [place], [place] [address]   Troponin I (High Sensitivity)     Status: Abnormal   Collection Time: 06/10/22 11:21 AM  Result Value Ref Range   Troponin I (High Sensitivity) 42 (H) <18 ng/L    Comment: (NOTE) Elevated high sensitivity troponin I (hsTnI) values and significant  changes across serial measurements may suggest ACS but many other  chronic and acute conditions are known to elevate hsTnI results.  Refer to the "Links" section for chest pain algorithms and additional  guidance. Performed at [facility], [place] [address] [place], [place] [id number]   Basic metabolic panel     Status: Abnormal   Collection Time: 06/10/22 11:21 AM  Result Value Ref Range   Sodium 125 (L) 135 - 145 mmol/L   Potassium 4.6 3.5 - 5.1 mmol/L   Chloride 84 (L) 98 - 111 mmol/L   CO2 16 (L) 22 - 32 mmol/L   Glucose, Bld 97 70 - 99 mg/dL    Comment: Glucose reference range applies only to samples taken after fasting for at least 8 hours.   BUN 137 (H) 6 - 20 mg/dL    Comment: RESULT CONFIRMED BY MANUAL DILUTION   Creatinine, Ser 12.44 (H) 0.44 - 1.00 mg/dL   Calcium 7.2 (L) 8.9 - 10.3 mg/dL   GFR, Estimated 3 (L) >60 mL/min    Comment: (NOTE) Calculated using the CKD-EPI Creatinine Equation (2021)    Anion gap >20 (H) 5 - 15    Comment: ELECTROLYTES REPEATED TO VERIFY Performed at [place] [address] [place], [place] [address]    CT ABDOMEN PELVIS WO  CONTRAST  Result Date: 06/10/2022 CLINICAL DATA:  Acute abdominal pain, pancreatic mass, biliary drain, new renal failure, weakness, nausea, vomiting EXAM: CT ABDOMEN AND PELVIS WITHOUT CONTRAST TECHNIQUE: Multidetector CT imaging of the abdomen and pelvis was performed following the standard protocol without IV contrast. RADIATION DOSE REDUCTION: This exam was performed according to the departmental dose-optimization program which includes automated exposure control, adjustment of the mA and/or kV according to patient size and/or use of iterative reconstruction technique. COMPARISON:  Of 2024 FINDINGS: Lower chest: Dependent bibasilar atelectasis and lower lobe bronchiectasis Hepatobiliary: Percutaneous biliary drain traverses RIGHT lobe liver and CBD into duodenum, pigtail catheter at third portion. Gallbladder decompressed. No focal hepatic mass. Pancreas: Ill-defined pancreatic head. Remainder of pancreas unremarkable. Spleen: Normal appearance Adrenals/Urinary Tract: Adrenal glands, kidneys, ureters, and bladder normal appearance Stomach/Bowel: Increased stool in rectum. Scattered stool in remainder of colon. Stomach unremarkable. Infiltrative changes adjacent to duodenum. Remaining bowel loops unremarkable. Vascular/Lymphatic: Atherosclerotic calcification aorta and iliac arteries. Few pelvic phleboliths. Reproductive: Unremarkable uterus and adnexa Other: No free air or free fluid. New diffuse infiltrative process identified within the RIGHT upper quadrant, posterior and medial to the hepatic flexure of the colon, anterolateral to the pancreas, approximately 11.0 x 6.8 cm in size, 8.5 cm length, ill-defined. Mild associated wall thickening of the hepatic flexure of the colon with anterolateral colonic displacement. Fat within this region on the previous CT was clear. Musculoskeletal: No acute  osseous findings. IMPRESSION: New diffuse infiltrative process within the RIGHT upper quadrant, posterior and medial  to the hepatic flexure of the colon, anterolateral to the pancreas, approximately 11.0 x 6.8 x 8.5 cm in size, with mild associated wall thickening of the hepatic flexure of the colon with anterolateral colonic displacement. Fat within this region on the previous exam was clear. Differential diagnosis includes hemorrhage, infection, or marked tumor progression, atypical for tumor progression due to the short interval since previous CT. Recommend correlation with hemoglobin and white count. Electronically Signed   By: Lavonia Dana M.D.   On: 06/10/2022 13:16   DG Chest Port 1 View  Result Date: 06/10/2022 CLINICAL DATA:  Questionable sepsis. EXAM: PORTABLE CHEST 1 VIEW COMPARISON:  02/19/2022. FINDINGS: 0933 hours. Low lung volumes accentuate the pulmonary vasculature and cardiomediastinal silhouette. No focal airspace opacity. Otherwise stable cardiac and mediastinal contours. No pleural effusion or pneumothorax. IMPRESSION: Low lung volumes without evidence of acute cardiopulmonary disease. Electronically Signed   By: Emmit Alexanders M.D.   On: 06/10/2022 09:38    Pending Labs Unresulted Labs (From admission, onward)     Start     Ordered   06/11/22 0500  CBC  Tomorrow morning,   R        06/10/22 1314   06/11/22 0500  Comprehensive metabolic panel  Tomorrow morning,   R        06/10/22 1314   06/11/22 0500  Magnesium  Tomorrow morning,   R        06/10/22 1314   06/11/22 0500  Phosphorus  Tomorrow morning,   R        06/10/22 1314   06/11/22 0500  Hemoglobin A1c  Tomorrow morning,   R        06/10/22 1314   06/10/22 1251  Creatinine, urine, random  Once,   URGENT        06/10/22 1250   06/10/22 1251  Sodium, urine, random  Once,   URGENT        06/10/22 1250   06/10/22 0927  Blood Culture (routine x 2)  (Septic presentation on arrival (screening labs, nursing and treatment orders for obvious sepsis))  BLOOD CULTURE X 2,   STAT      06/10/22 0926   06/10/22 0927  Urinalysis, w/ Reflex to  Culture (Infection Suspected) -Urine, Clean Catch  (Septic presentation on arrival (screening labs, nursing and treatment orders for obvious sepsis))  Once,   URGENT       Question:  Specimen Source  Answer:  Urine, Clean Catch   06/10/22 0926            Vitals/Pain Today's Vitals   06/10/22 1150 06/10/22 1200 06/10/22 1210 06/10/22 1255  BP: 98/76 109/77 110/66 117/68  Pulse: 74 82 78 81  Resp: 15 20 16 12  $ Temp:      TempSrc:      SpO2: 100% 97% 100% 99%  Weight:      Height:        Isolation Precautions No active isolations  Medications Medications  lactated ringers infusion ( Intravenous New Bag/Given 06/10/22 1146)  sodium bicarbonate 150 mEq in sterile water 1,150 mL infusion ( Intravenous New Bag/Given 06/10/22 1317)  lactated ringers bolus 1,000 mL (0 mLs Intravenous Stopped 06/10/22 1041)  ceFEPIme (MAXIPIME) 2 g in sodium chloride 0.9 % 100 mL IVPB (0 g Intravenous Stopped 06/10/22 1125)  metroNIDAZOLE (FLAGYL) IVPB 500 mg (0 mg Intravenous Stopped 06/10/22 1144)  lactated ringers bolus 1,000 mL (0 mLs Intravenous Stopped 06/10/22 1153)  lactated ringers bolus 1,000 mL (0 mLs Intravenous Stopped 06/10/22 1317)    Mobility walks     Focused Assessments     R Recommendations: See Admitting Provider Note  Report given to:   Additional Notes:

## 2022-06-10 NOTE — Progress Notes (Signed)
PHARMACY -  BRIEF ANTIBIOTIC NOTE   Pharmacy has received consult(s) for cefepime from an ED provider.  The patient's profile has been reviewed for ht/wt/allergies/indication/available labs.    One time order(s) placed for cefepime 2 g  Further antibiotics/pharmacy consults should be ordered by admitting physician if indicated.                       Thank you,  Tawnya Crook, PharmD, BCPS Clinical Pharmacist 06/10/2022 9:30 AM

## 2022-06-10 NOTE — ED Notes (Signed)
Pt eating  at present. Dr. Feliberto Gottron verbalized that pt it able to eat

## 2022-06-10 NOTE — Consult Note (Signed)
Michelle Williams  [phone]   HEMATOLOGY ONCOLOGY INPATIENT CONSULTATION   Michelle Williams  DOB: [birth year]  MR#: [id number]  CSN#: [id number]    Requesting Physician: [facility]  Patient Care Team: Cipriano Mile, NP as PCP - General Jettie Booze, MD as PCP - Cardiology (Cardiology)  Reason for consult: Newly diagnosed pancreatic cancer  History of present illness:   Patient is a 62 year old female with newly diagnosed pancreatic cancer, came to my office today for her consultation.  She was hypotensive, feeling poorly upon arrival, and we sent her to emergency room for further evaluation.  She received IV fluids for dehydration, and feels much better.  She was admitted to [facility] for further management.  She had an outpatient ultrasound performed on 05/17/22 to evaluate jaundice which showed dilated common bile duct and cholelithiasis. The patient presented to the emergency room on 05/21/2022 for worsening right upper quadrant abdominal pain and jaundice.  The jaundice started approximately 2 to 3 weeks prior to the emergency room visit on 05/21/2022. In the emergency room labs drawn revealed obstructive jaundice with a T. bili of 14.9 (acutely elevated from 3 months ago which was normal at 0.3 on 02/19/22), direct bili elevated at 9.3, elevated LFTs with an AST of 293, ALT of 460, and elevated alkaline phosphatase to 435.   She had a CT scan of the abdomen/pelvis that showed dilatation of the proximal common bile duct measuring up to 1.3 cm with abrupt termination the CBD at the level of the head of pancreas. She has additional main duct dilatation which also abruptly terminates at the level of the head of pancreas. Imaging findings are concerning for underlying obstructing lesion within the head of pancreas. There is also a nonspecific 2 mm RUL lung nodule that was noted on low dose lung cancer screen on 04/15/22.   To further evaluate this, she  had MRCP but this could not be completed due to the patient aborting the exam. This again noted CBD dilation which abruptly terminates at the head of the pancreas and main duct dilation to the head of the pancreas. There is a subtle signal abnormality at this area measuring approximately 1.9 x 0.7 cm concerning for underlying neoplasm.   Dr. Fuller Plan attempted to perform ERCP on 05/23/22 but, unable to enter the papillary orifice. Negative for ampullary lesion based on ERCP.   IR placed a biliary drain on 05/25/22. On 05/27/22 she had successful exchange of the percutaneous biliary drain to an internal/external biliary drain. Additionally, IR performed brushings of the distal CBD obstruction.   The final pathology of the bile duct brushings ([id number]) was consistent with malignant cells showing adenocarcinoma. Her baseline CEA 19.9 was elevated at 158 on 04/25/23. She was discharged home on May 28, 2022.  She lives alone, has no children.  She has 4 siblings, 3 of them live in [place].  She came in to my office this morning with a female friend, who has been her care giver lately.     MEDICAL HISTORY:  Past Medical History:  Diagnosis Date   Acute bronchitis    Allergy    Anemia    Anxiety    Arthritis    Cataract    BEGINIING   Cervical cancer ([place])    Degenerative joint disease    UPPER,LOWER BACK   Gallstones    H/O degenerative disc disease    H/O rheumatoid arthritis    Hypertension    Neuropathy  BILATERAL    SURGICAL HISTORY: Past Surgical History:  Procedure Laterality Date   BREAST EXCISIONAL BIOPSY Right    COLONOSCOPY     2018   ERCP N/A 05/23/2022   Procedure: ENDOSCOPIC RETROGRADE CHOLANGIOPANCREATOGRAPHY (ERCP);  Surgeon: Ladene Artist, MD;  Location: [facility];  Service: Gastroenterology;  Laterality: N/A;   IR BILIARY DRAIN PLACEMENT WITH CHOLANGIOGRAM  05/25/2022   IR ENDOLUMINAL BX OF BILIARY TREE  05/30/2022   IR FALLOPIAN TUBE  CATHETERIZATION     IR INT EXT BILIARY DRAIN WITH CHOLANGIOGRAM  05/27/2022   POLYPECTOMY      SOCIAL HISTORY: Social History   Socioeconomic History   Marital status: Single    Spouse name: Not on file   Number of children: Not on file   Years of education: Not on file   Highest education level: Not on file  Occupational History   Not on file  Tobacco Use   Smoking status: Every Day    Packs/day: 0.50    Types: Cigarettes    Passive exposure: Never   Smokeless tobacco: Never  Vaping Use   Vaping Use: Never used  Substance and Sexual Activity   Alcohol use: Not Currently    Comment: wine occ   Drug use: No   Sexual activity: Yes    Birth control/protection: None  Other Topics Concern   Not on file  Social History Narrative   Not on file   Social Determinants of Health   Financial Resource Strain: Not on file  Food Insecurity: No Food Insecurity (06/10/2022)   Hunger Vital Sign    Worried About Running Out of Food in the Last Year: Never true    Ran Out of Food in the Last Year: Never true  Transportation Needs: No Transportation Needs (06/10/2022)   PRAPARE - Hydrologist (Medical): No    Lack of Transportation (Non-Medical): No  Physical Activity: Not on file  Stress: Not on file  Social Connections: Not on file  Intimate Partner Violence: Not At Risk (06/10/2022)   Humiliation, Afraid, Rape, and Kick questionnaire    Fear of Current or Ex-Partner: No    Emotionally Abused: No    Physically Abused: No    Sexually Abused: No    FAMILY HISTORY: Family History  Problem Relation Age of Onset   Hypertension Mother    Stroke Mother    Early death Father    Colon cancer Brother        unsure age when diagnosed    HIV/AIDS Brother    Diabetes Maternal Grandmother    Colon polyps Neg Hx    Esophageal cancer Neg Hx    Rectal cancer Neg Hx    Stomach cancer Neg Hx    Crohn's disease Neg Hx    Ulcerative colitis Neg Hx      ALLERGIES:  has No Known Allergies.  MEDICATIONS:  Current Facility-Administered Medications  Medication Dose Route Frequency Provider Last Rate Last Admin   heparin injection 5,000 Units  5,000 Units Subcutaneous Q12H Lucienne Minks, MD   5,000 Units at 06/10/22 1456   meropenem (MERREM) 1 g in sodium chloride 0.9 % 100 mL IVPB  1 g Intravenous Q24H Lucienne Minks, MD       Oral care mouth rinse  15 mL Mouth Rinse PRN Lucienne Minks, MD       sodium bicarbonate 150 mEq in sterile water 1,150 mL infusion   Intravenous Continuous Lucienne Minks, MD 150  mL/hr at 06/10/22 1534 Infusion Verify at 06/10/22 1534    REVIEW OF SYSTEMS:   Constitutional: Denies fevers, chills or abnormal night sweats, (+) fatigue and weight loss  Eyes: Denies blurriness of vision, double vision or watery eyes Ears, nose, mouth, throat, and face: Denies mucositis or sore throat Respiratory: Denies cough, dyspnea or wheezes Cardiovascular: Denies palpitation, chest discomfort or lower extremity swelling Gastrointestinal:  Denies nausea, heartburn or change in bowel habits Skin: Denies abnormal skin rashes Lymphatics: Denies new lymphadenopathy or easy bruising Neurological:Denies numbness, tingling or new weaknesses Behavioral/Psych: Mood is stable, no new changes  All other systems were reviewed with the patient and are negative.  PHYSICAL EXAMINATION: ECOG PERFORMANCE STATUS: 3 - Symptomatic, >50% confined to bed  Vitals:   06/10/22 1330 06/10/22 1657  BP: 104/71 (!) 107/59  Pulse: 83 76  Resp: 16 16  Temp:  98.6 F (37 C)  SpO2: 100% 100%   Filed Weights   06/10/22 0918  Weight: 144 lb 14.4 oz (65.7 kg)    GENERAL:alert, no distress and comfortable SKIN: skin color, texture, turgor are normal, no rashes or significant lesions EYES: normal, conjunctiva are pink and non-injected, sclera clear OROPHARYNX:no exudate, no erythema and lips, buccal mucosa, and tongue normal  NECK: supple, thyroid  normal size, non-tender, without nodularity LYMPH:  no palpable lymphadenopathy in the cervical, axillary or inguinal LUNGS: clear to auscultation and percussion with normal breathing effort HEART: regular rate & rhythm and no murmurs and no lower extremity edema ABDOMEN:abdomen soft, non-tender and normal bowel sounds Musculoskeletal:no cyanosis of digits and no clubbing  PSYCH: alert & oriented x 3 with fluent speech NEURO: no focal motor/sensory deficits  LABORATORY DATA:  I have reviewed the data as listed Lab Results  Component Value Date   WBC 16.9 (H) 06/10/2022   HGB 12.1 06/10/2022   HCT 34.4 (L) 06/10/2022   MCV 85.1 06/10/2022   PLT 514 (H) 06/10/2022   Recent Labs    05/20/22 2050 05/21/22 1224 05/22/22 0328 05/27/22 0420 05/28/22 0140 06/10/22 0927 06/10/22 1121  NA 133* 135   < > 134* 133* 124* 125*  K 2.9* 2.9*   < > 3.7 3.2* 4.9 4.6  CL 97* 100   < > 102 100 76* 84*  CO2 26 25   < > 20* 20* 16* 16*  GLUCOSE 94 138*   < > 103* 189* 135* 97  BUN <5* 5*   < > 11 14 167* 137*  CREATININE 0.67 0.60   < > 0.59 0.76 14.38* 12.44*  CALCIUM 9.2 8.8*   < > 9.0 9.5 8.5* 7.2*  GFRNONAA >60 >60   < > >60 >60 3* 3*  PROT 7.3 6.9   < > 6.7 7.7 8.5*  --   ALBUMIN 3.2* 2.9*   < > 2.8* 3.2* 3.2*  --   AST 293* 273*   < > 105* 150* 48*  --   ALT 460* 428*   < > 257* 306* 75*  --   ALKPHOS 435* 430*   < > 419* 418* 270*  --   BILITOT 14.9* 14.4*   < > 7.2* 7.4* 5.7*  --   BILIDIR 9.3* 9.1*  --   --   --   --   --   IBILI 5.6* 5.3*  --   --   --   --   --    < > = values in this interval not displayed.    RADIOGRAPHIC STUDIES:  I have personally reviewed the radiological images as listed and agreed with the findings in the report. ECHOCARDIOGRAM COMPLETE  Result Date: 06/10/2022    ECHOCARDIOGRAM REPORT   Patient Name:   Michelle Williams Date of Exam: 06/10/2022 Medical Rec #:  [id number]       Height:       60.0 in Accession #:    [id number]      Weight:       144.9 lb Date  of Birth:  30-May-1960       BSA:          1.628 m Patient Age:    60 years        BP:           124/74 mmHg Patient Gender: F               HR:           81 bpm. Exam Location:  Inpatient Procedure: 2D Echo, Cardiac Doppler and Color Doppler Indications:    Elevated Troponin  History:        Patient has prior history of Echocardiogram examinations, most                 recent 06/01/2022. Acute renal failure; Risk Factors:Hypertension                 and Current Smoker.  Sonographer:    Wilkie Aye RVT RCS Referring Phys: [id number] [facility]  Sonographer Comments: Image acquisition challenging due to respiratory motion. IMPRESSIONS  1. Left ventricular ejection fraction, by estimation, is 70 to 75%. The left ventricle has hyperdynamic function. The left ventricle has no regional wall motion abnormalities. There is mild left ventricular hypertrophy. Left ventricular diastolic parameters are consistent with Grade I diastolic dysfunction (impaired relaxation). LVOT gradient 9 mmHg  2. Right ventricular systolic function is normal. The right ventricular size is normal. There is normal pulmonary artery systolic pressure. The estimated right ventricular systolic pressure is 0000000 mmHg.  3. The mitral valve is normal in structure. Mild mitral valve regurgitation.  4. The aortic valve is tricuspid. Aortic valve regurgitation is not visualized. No aortic stenosis is present.  5. The inferior vena cava is normal in size with greater than 50% respiratory variability, suggesting right atrial pressure of 3 mmHg. FINDINGS  Left Ventricle: Left ventricular ejection fraction, by estimation, is 70 to 75%. The left ventricle has hyperdynamic function. The left ventricle has no regional wall motion abnormalities. The left ventricular internal cavity size was small. There is mild left ventricular hypertrophy. Left ventricular diastolic parameters are consistent with Grade I diastolic dysfunction (impaired relaxation). Right Ventricle:  The right ventricular size is normal. No increase in right ventricular wall thickness. Right ventricular systolic function is normal. There is normal pulmonary artery systolic pressure. The tricuspid regurgitant velocity is 2.49 m/s, and  with an assumed right atrial pressure of 3 mmHg, the estimated right ventricular systolic pressure is 0000000 mmHg. Left Atrium: Left atrial size was normal in size. Right Atrium: Right atrial size was normal in size. Pericardium: There is no evidence of pericardial effusion. Mitral Valve: The mitral valve is normal in structure. Mild mitral valve regurgitation. Tricuspid Valve: The tricuspid valve is normal in structure. Tricuspid valve regurgitation is trivial. Aortic Valve: The aortic valve is tricuspid. Aortic valve regurgitation is not visualized. No aortic stenosis is present. Aortic valve mean gradient measures 5.0 mmHg. Aortic valve peak gradient measures 11.4 mmHg. Aortic valve area, by VTI  measures 4.54  cm. Pulmonic Valve: The pulmonic valve was not well visualized. Pulmonic valve regurgitation is not visualized. Aorta: The aortic root and ascending aorta are structurally normal, with no evidence of dilitation. Venous: The inferior vena cava is normal in size with greater than 50% respiratory variability, suggesting right atrial pressure of 3 mmHg. IAS/Shunts: The interatrial septum was not well visualized.  LEFT VENTRICLE PLAX 2D LVIDd:         3.00 cm   Diastology LVIDs:         2.20 cm   LV e' medial:    3.48 cm/s LV PW:         1.50 cm   LV E/e' medial:  23.2 LV IVS:        1.20 cm   LV e' lateral:   5.66 cm/s LVOT diam:     2.00 cm   LV E/e' lateral: 14.3 LV SV:         117 LV SV Index:   72 LVOT Area:     3.14 cm  RIGHT VENTRICLE             IVC RV Basal diam:  4.10 cm     IVC diam: 1.40 cm RV S prime:     25.60 cm/s TAPSE (M-mode): 1.9 cm LEFT ATRIUM             Index        RIGHT ATRIUM          Index LA diam:        3.00 cm 1.84 cm/m   RA Area:     9.87 cm LA  Vol (A2C):   25.1 ml 15.42 ml/m  RA Volume:   20.70 ml 12.72 ml/m LA Vol (A4C):   31.7 ml 19.48 ml/m LA Biplane Vol: 28.2 ml 17.33 ml/m  AORTIC VALVE                     PULMONIC VALVE AV Area (Vmax):    3.90 cm      PV Vmax:       1.00 m/s AV Area (Vmean):   4.21 cm      PV Peak grad:  4.0 mmHg AV Area (VTI):     4.54 cm AV Vmax:           169.00 cm/s AV Vmean:          99.300 cm/s AV VTI:            0.258 m AV Peak Grad:      11.4 mmHg AV Mean Grad:      5.0 mmHg LVOT Vmax:         210.00 cm/s LVOT Vmean:        133.000 cm/s LVOT VTI:          0.373 m LVOT/AV VTI ratio: 1.45  AORTA Ao Root diam: 3.00 cm Ao Asc diam:  3.40 cm Ao Arch diam: 2.2 cm MITRAL VALVE                TRICUSPID VALVE MV Area (PHT): 2.66 cm     TR Peak grad:   24.8 mmHg MV Decel Time: 285 msec     TR Vmax:        249.00 cm/s MV E velocity: 81.00 cm/s MV A velocity: 109.00 cm/s  SHUNTS MV E/A ratio:  0.74         Systemic VTI:  0.37 m  Systemic Diam: 2.00 cm Oswaldo Milian MD Electronically signed by Oswaldo Milian MD Signature Date/Time: 06/10/2022/5:47:15 PM    Final    CT ABDOMEN PELVIS WO CONTRAST  Result Date: 06/10/2022 CLINICAL DATA:  Acute abdominal pain, pancreatic mass, biliary drain, new renal failure, weakness, nausea, vomiting EXAM: CT ABDOMEN AND PELVIS WITHOUT CONTRAST TECHNIQUE: Multidetector CT imaging of the abdomen and pelvis was performed following the standard protocol without IV contrast. RADIATION DOSE REDUCTION: This exam was performed according to the departmental dose-optimization program which includes automated exposure control, adjustment of the mA and/or kV according to patient size and/or use of iterative reconstruction technique. COMPARISON:  Of 2024 FINDINGS: Lower chest: Dependent bibasilar atelectasis and lower lobe bronchiectasis Hepatobiliary: Percutaneous biliary drain traverses RIGHT lobe liver and CBD into duodenum, pigtail catheter at third portion.  Gallbladder decompressed. No focal hepatic mass. Pancreas: Ill-defined pancreatic head. Remainder of pancreas unremarkable. Spleen: Normal appearance Adrenals/Urinary Tract: Adrenal glands, kidneys, ureters, and bladder normal appearance Stomach/Bowel: Increased stool in rectum. Scattered stool in remainder of colon. Stomach unremarkable. Infiltrative changes adjacent to duodenum. Remaining bowel loops unremarkable. Vascular/Lymphatic: Atherosclerotic calcification aorta and iliac arteries. Few pelvic phleboliths. Reproductive: Unremarkable uterus and adnexa Other: No free air or free fluid. New diffuse infiltrative process identified within the RIGHT upper quadrant, posterior and medial to the hepatic flexure of the colon, anterolateral to the pancreas, approximately 11.0 x 6.8 cm in size, 8.5 cm length, ill-defined. Mild associated wall thickening of the hepatic flexure of the colon with anterolateral colonic displacement. Fat within this region on the previous CT was clear. Musculoskeletal: No acute osseous findings. IMPRESSION: New diffuse infiltrative process within the RIGHT upper quadrant, posterior and medial to the hepatic flexure of the colon, anterolateral to the pancreas, approximately 11.0 x 6.8 x 8.5 cm in size, with mild associated wall thickening of the hepatic flexure of the colon with anterolateral colonic displacement. Fat within this region on the previous exam was clear. Differential diagnosis includes hemorrhage, infection, or marked tumor progression, atypical for tumor progression due to the short interval since previous CT. Recommend correlation with hemoglobin and white count. Electronically Signed   By: Lavonia Dana M.D.   On: 06/10/2022 13:16   DG Chest Port 1 View  Result Date: 06/10/2022 CLINICAL DATA:  Questionable sepsis. EXAM: PORTABLE CHEST 1 VIEW COMPARISON:  02/19/2022. FINDINGS: 0933 hours. Low lung volumes accentuate the pulmonary vasculature and cardiomediastinal silhouette.  No focal airspace opacity. Otherwise stable cardiac and mediastinal contours. No pleural effusion or pneumothorax. IMPRESSION: Low lung volumes without evidence of acute cardiopulmonary disease. Electronically Signed   By: Emmit Alexanders M.D.   On: 06/10/2022 09:38   ECHOCARDIOGRAM COMPLETE  Result Date: 06/01/2022    ECHOCARDIOGRAM REPORT   Patient Name:   Michelle Williams Date of Exam: 06/01/2022 Medical Rec #:  [id number]       Height:       64.0 in Accession #:    [id number]      Weight:       162.4 lb Date of Birth:  03-26-1961       BSA:          1.791 m Patient Age:    61 years        BP:           142/86 mmHg Patient Gender: F               HR:           88 bpm.  Exam Location:  [facility] Procedure: 2D Echo, Cardiac Doppler and Color Doppler Indications:    R94.31 Abnormal EKG  History:        Patient has no prior history of Echocardiogram examinations.                 Abnormal ECG, Signs/Symptoms:Shortness of Breath; Risk                 Factors:Hypertension and Current Smoker.  Sonographer:    Coralyn Helling RDCS Referring Phys: [place]  Sonographer Comments: Image acquisition challenging due to respiratory motion. IMPRESSIONS  1. LVOT mean gradient 9 mmHg. Left ventricular ejection fraction, by estimation, is 70 to 75%. The left ventricle has hyperdynamic function. The left ventricle has no regional wall motion abnormalities. There is mild left ventricular hypertrophy. Left ventricular diastolic parameters are consistent with Grade I diastolic dysfunction (impaired relaxation).  2. Right ventricular systolic function is normal. The right ventricular size is normal.  3. SAM (systolic anterior motion of the mitral valve). The mitral valve is myxomatous. No evidence of mitral valve regurgitation. No evidence of mitral stenosis.  4. The aortic valve is tricuspid. Aortic valve regurgitation is not visualized. No aortic stenosis is present.  5. The inferior vena cava is normal in size  with greater than 50% respiratory variability, suggesting right atrial pressure of 3 mmHg. FINDINGS  Left Ventricle: LVOT mean gradient 9 mmHg. Left ventricular ejection fraction, by estimation, is 70 to 75%. The left ventricle has hyperdynamic function. The left ventricle has no regional wall motion abnormalities. The left ventricular internal cavity size was normal in size. There is mild left ventricular hypertrophy. Left ventricular diastolic parameters are consistent with Grade I diastolic dysfunction (impaired relaxation). Right Ventricle: The right ventricular size is normal. No increase in right ventricular wall thickness. Right ventricular systolic function is normal. Left Atrium: Left atrial size was normal in size. Right Atrium: Right atrial size was normal in size. Pericardium: There is no evidence of pericardial effusion. Mitral Valve: SAM (systolic anterior motion of the mitral valve). The mitral valve is myxomatous. There is mild thickening of the mitral valve leaflet(s). No evidence of mitral valve regurgitation. No evidence of mitral valve stenosis. Tricuspid Valve: The tricuspid valve is normal in structure. Tricuspid valve regurgitation is mild . No evidence of tricuspid stenosis. Aortic Valve: The aortic valve is tricuspid. Aortic valve regurgitation is not visualized. No aortic stenosis is present. Aortic valve mean gradient measures 9.0 mmHg. Aortic valve peak gradient measures 17.7 mmHg. Aortic valve area, by VTI measures 2.49  cm. Pulmonic Valve: The pulmonic valve was normal in structure. Pulmonic valve regurgitation is not visualized. No evidence of pulmonic stenosis. Aorta: The aortic root is normal in size and structure. Venous: The inferior vena cava is normal in size with greater than 50% respiratory variability, suggesting right atrial pressure of 3 mmHg. IAS/Shunts: No atrial level shunt detected by color flow Doppler.  LEFT VENTRICLE PLAX 2D LVIDd:         3.50 cm   Diastology LVIDs:          2.30 cm   LV e' medial:    8.38 cm/s LV PW:         1.30 cm   LV E/e' medial:  7.1 LV IVS:        1.30 cm   LV e' lateral:   9.90 cm/s LVOT diam:     2.00 cm   LV E/e' lateral: 6.0  LV SV:         67 LV SV Index:   37 LVOT Area:     3.14 cm  RIGHT VENTRICLE             IVC RV S prime:     24.10 cm/s  IVC diam: 1.20 cm TAPSE (M-mode): 1.7 cm RVSP:           25.8 mmHg LEFT ATRIUM             Index        RIGHT ATRIUM           Index LA diam:        3.10 cm 1.73 cm/m   RA Pressure: 3.00 mmHg LA Vol (A2C):   34.3 ml 19.15 ml/m  RA Area:     8.55 cm LA Vol (A4C):   38.8 ml 21.67 ml/m  RA Volume:   16.10 ml  8.99 ml/m LA Biplane Vol: 36.5 ml 20.38 ml/m  AORTIC VALVE AV Area (Vmax):    2.37 cm AV Area (Vmean):   2.30 cm AV Area (VTI):     2.49 cm AV Vmax:           210.50 cm/s AV Vmean:          138.000 cm/s AV VTI:            0.269 m AV Peak Grad:      17.7 mmHg AV Mean Grad:      9.0 mmHg LVOT Vmax:         159.00 cm/s LVOT Vmean:        101.000 cm/s LVOT VTI:          0.213 m LVOT/AV VTI ratio: 0.79  AORTA Ao Root diam: 3.10 cm Ao Asc diam:  3.20 cm MITRAL VALVE               TRICUSPID VALVE MV Area (PHT): 2.94 cm    TR Peak grad:   22.8 mmHg MV Decel Time: 258 msec    TR Vmax:        239.00 cm/s MV E velocity: 59.20 cm/s  Estimated RAP:  3.00 mmHg MV A velocity: 85.90 cm/s  RVSP:           25.8 mmHg MV E/A ratio:  0.69                            SHUNTS                            Systemic VTI:  0.21 m                            Systemic Diam: 2.00 cm Candee Furbish MD Electronically signed by Candee Furbish MD Signature Date/Time: 06/01/2022/3:28:32 PM    Final    IR INT EXT BILIARY DRAIN WITH CHOLANGIOGRAM  Result Date: 05/27/2022 INDICATION: Biliary obstruction, Status post percutaneous biliary drain catheter placement 05/25/2022. Internalization with breast biopsy requested. EXAM: EXCHANGE OF BILIARY DRAIN FOR INTERNAL/EXTERNAL CATHETER BRUSH BIOPSY UNDER FLUOROSCOPY MEDICATIONS: Rocephin 2 g IV; the  antibiotic was administered within an appropriate time frame prior to the initiation of the procedure. Patient received 10 mg hydralazine IV during the procedure due to hypertension ANESTHESIA/SEDATION: Intravenous Fentanyl 174mg and Versed 243mwere administered as conscious sedation during continuous monitoring of the  patient's level of consciousness and physiological / cardiorespiratory status by the radiology RN, with a total moderate sedation time of 18 minutes. FLUOROSCOPY: Radiation Exposure Index (as provided by the fluoroscopic device): 13 mGy Kerma COMPLICATIONS: None immediate. PROCEDURE: Informed written consent was obtained from the patient after a thorough discussion of the procedural risks, benefits and alternatives. All questions were addressed. Maximal Sterile Barrier Technique was utilized including caps, mask, sterile gowns, sterile gloves, sterile drape, hand hygiene and skin antiseptic. A timeout was performed prior to the initiation of the procedure. Contrast injection through the existing catheter demonstrates good position within the biliary tree. No contrast passage into the duodenum. Catheter was then cut and exchanged over 035 wire for a 5 [place] Kumpe catheter, negotiated across the distal CBD obstruction into the distal duodenum. Catheter exchanged over Amplatz wire for a 5 French vascular sheath. Brush biopsy x2 of the distal CBD obstruction region was performed, and submitted to cytopathology. The angiographic catheter was then exchanged for a new 10 French multi sidehole internal/external biliary drainage catheter, positioned with the distal end locked in the distal duodenum, proximal side holes spanning the CBD obstruction. Catheter injection shows catheter patency, with good positioning, and decompression of the biliary tree. Catheter secured externally with 0 Prolene suture and placed to external drainage bag for now. The patient tolerated the procedure well. IMPRESSION: 1.  Successful exchange of percutaneous biliary drain to an internal/external biliary drain. 2. Brush biopsy of distal CBD obstruction. Electronically Signed   By: Lucrezia Europe M.D.   On: 05/27/2022 15:49   IR BILIARY DRAIN PLACEMENT WITH CHOLANGIOGRAM  Result Date: 05/25/2022 INDICATION: 62 year old female referred for PTC and drainage EXAM: IMAGE GUIDED PERCUTANEOUS TRANS HEPATIC CHOLANGIOGRAM AND EXTERNAL DRAINAGE MEDICATIONS: 2 g cefoxitin; The antibiotic was administered within an appropriate time frame prior to the initiation of the procedure. 4 mg Zofran, 25 mg IV Demerol ANESTHESIA/SEDATION: Moderate (conscious) sedation was employed during this procedure. A total of Versed 1.5 mg and Fentanyl 75 mcg was administered intravenously by the radiology nurse. Total intra-service moderate Sedation Time: 35 minutes. The patient's level of consciousness and vital signs were monitored continuously by radiology nursing throughout the procedure under my direct supervision. FLUOROSCOPY: Radiation Exposure Index (as provided by the fluoroscopic device): 57 mGy Kerma COMPLICATIONS: SIR LEVEL B - Normal therapy, includes overnight admission for observation. Rigors PROCEDURE: The procedure, risks, benefits, and alternatives were explained to the patient and the patient's family. A complete informed consent was performed, with risk benefit analysis. Specific risks that were discussed for the procedure include bleeding, infection, biliary sepsis, IC use day, organ injury, need for further procedure, need for further surgery, long-term drain placement, cardiopulmonary collapse, death. Questions regarding the procedure were encouraged and answered. The patient understands and consents to the procedure. Patient is position in supine position on the fluoroscopy table, and the upper abdomen was prepped and draped in the usual sterile fashion. Maximum barrier sterile technique with sterile gowns and gloves were used for the  procedure. A timeout was performed prior to the initiation of the procedure. Local anesthesia was provided with 1% lidocaine with epinephrine. Ultrasound survey of the right liver lobe was performed. Window into the right biliary system was only present for the right liver approach. 1% lidocaine was used for local anesthesia, with generous infiltration of the skin and subcutaneous tissues in and inter left costal location. A Chiba needle was advanced under ultrasound guidance into the right liver lobe. Once the tip of the needle  was confirmed within the biliary system by injecting small aliquots of contrast, images were stored of the biliary system after partially opacifying the biliary tree via the needle. 018 wire was advanced centrally. The needle was removed, a small incision was made with an 11 blade scalpel, and then a triaxial Accustick system was advanced into the biliary system. The metal stiffener and dilator were removed, we confirmed placement with contrast infusion. Attempt was made to cross the obstruction of the common bile duct. Combination of standard stiff Glidewire, roadrunner wire, with coaxial 4 French glide cath were used. Unsuccessful in navigating the occlusion. The patient was beginning to experience rigors, and so we decided to withdraw at this time. 035 guidewire was advanced and the Accustick was removed. Dilation of the subcutaneous tissue tracks was performed with an 10 [place] dilator, and then a 10 [place] multipurpose drain was placed as an externalized biliary drain. Small amount of contrast confirmed location. Drain was sutured in position and attached to gravity. Sample was sent for culture The patient tolerated the procedure well and remained hemodynamically stable throughout. No complications were encountered and no significant blood loss was encountered. IMPRESSION: Status post image guided percutaneous transhepatic cholangiogram with placement of an externalized biliary stent.  Signed, Dulcy Fanny. Dellia Nims, ABVM, RPVI [facility] Radiology PLAN: 24-48 hours of decompression will be required and then another attempt at an internal/external biliary drain. Electronically Signed   By: Corrie Mckusick D.O.   On: 05/25/2022 17:15   DG ERCP  Result Date: 05/23/2022 CLINICAL DATA:  62 year old female with a history of jaundice EXAM: ERCP TECHNIQUE: Multiple spot images obtained with the fluoroscopic device and submitted for interpretation post-procedure. FLUOROSCOPY: Radiation Exposure Index (as provided by the fluoroscopic device): 4.5 mGy Kerma COMPARISON:  MR 05/21/2022 FINDINGS: Limited intraoperative fluoroscopic spot images during ERCP. Initial image demonstrates endoscope projecting over the upper abdomen. Subsequently there is deployment of the safety wire, however, no infusion of contrast on the submitted images. IMPRESSION: Limited images during ERCP as above. Please refer to the dictated operative report for full details of intraoperative findings and procedure. Electronically Signed   By: Corrie Mckusick D.O.   On: 05/23/2022 15:50   DG C-Arm 1-60 Min-No Report  Result Date: 05/23/2022 Fluoroscopy was utilized by the requesting physician.  No radiographic interpretation.   MR ABDOMEN MRCP WO CONTRAST  Result Date: 05/22/2022 CLINICAL DATA:  Jaundice. EXAM: MRI ABDOMEN WITHOUT CONTRAST  (INCLUDING MRCP) TECHNIQUE: Multiplanar multisequence MR imaging of the abdomen was performed. Heavily T2-weighted images of the biliary and pancreatic ducts were obtained, and three-dimensional MRCP images were rendered by post processing. COMPARISON:  CT 05/21/2022 FINDINGS: Note: Exam detail is diminished. Patient aborted exam prior to completion of study. No composed contrast imaging obtained. Additionally, there is motion artifact which further diminishes diagnostic assessment of the area of concern within the head of pancreas. Lower chest: No  pleural fluid identified. Atelectasis noted within both lung bases. Hepatobiliary: Within the limitations of motion artifact there is no focal liver abnormality. No significant gallbladder wall thickening. As noted on recent CT there is marked intrahepatic and common bile duct dilatation. The common bile duct measures 1.4 cm in maximum diameter, image 15/5. Abrupt termination of the common bile duct within the head of pancreas is noted. Pancreas: No pancreatic inflammation. There is diffuse main duct dilatation up to the level of the head of pancreas which measures 6 mm in maximum diameter, image 25/3. The dilated pancreatic  duct terminates at a similar level as the dilated common bile duct. Here, there is a focal area of slight increased T2 signal and decreased T1 signal within the head of pancreas measuring approximately 1.9 x 0.7 cm, image 25/3 and image number 64/7. Imaging findings are highly suspicious for occult neoplasm within the head of pancreas, image 15/5. Spleen:  Within normal limits in size and appearance. Adrenals/Urinary Tract: Normal adrenal glands. No kidney mass or hydronephrosis. Stomach/Bowel: Visualized portions within the abdomen are unremarkable. Vascular/Lymphatic: Normal caliber of the abdominal aorta. No upper abdominal adenopathy identified. Other:  No ascites. Musculoskeletal: No suspicious bone lesions identified. IMPRESSION: 1. Exam detail is diminished. Patient aborted exam prior to completion of study and prior to administration of intravenous contrast material. Additionally, there is motion artifact which further diminishes diagnostic assessment of the area of concern within the head of pancreas. 2. Marked intrahepatic and common bile duct dilatation. Abrupt termination of the common bile duct within the head of pancreas is noted. There is diffuse main duct dilatation up to the level of the head of pancreas which measures 6 mm in maximum diameter. The dilated main duct terminates  at the same level as the termination of the CBD. Here, there is a subtle signal abnormality measuring approximately 1.9 x 0.7 cm. Imaging findings are concerning for underlying neoplasm within the head of pancreas. Advise further evaluation with ERCP and endoscopic ultrasound. Electronically Signed   By: Kerby Moors M.D.   On: 05/22/2022 04:31   CT ABDOMEN PELVIS W CONTRAST  Result Date: 05/21/2022 CLINICAL DATA:  Right upper quadrant pain EXAM: CT ABDOMEN AND PELVIS WITH CONTRAST TECHNIQUE: Multidetector CT imaging of the abdomen and pelvis was performed using the standard protocol following bolus administration of intravenous contrast. RADIATION DOSE REDUCTION: This exam was performed according to the departmental dose-optimization program which includes automated exposure control, adjustment of the mA and/or kV according to patient size and/or use of iterative reconstruction technique. CONTRAST:  1m OMNIPAQUE IOHEXOL 350 MG/ML SOLN COMPARISON:  Abdominal sonogram 05/20/2022 and CT AP 08/01/2012 FINDINGS: Lower chest: Atelectasis and ground-glass attenuation noted within the posterior lung bases. No pleural fluid or airspace disease. Subpleural nodule in the anterior right upper lobe measures 2 mm Hepatobiliary: No focal liver lesion identified. Stones noted on recent gallbladder ultrasound are not CT visible. There is no gallbladder wall thickening or pericholecystic fluid identified. Dilatation of the proximal common bile duct measures up to 1.3 cm. Abrupt termination the dilated common bile duct at the level of the head of pancreas, image 42/7. Pancreas: Unremarkable. No pancreatic ductal dilatation or surrounding inflammatory changes. Spleen: Normal in size without focal abnormality. Adrenals/Urinary Tract: Adrenal glands are unremarkable. Kidneys are normal, without renal calculi, focal lesion, or hydronephrosis. Bladder is unremarkable. Stomach/Bowel: Stomach is normal. The appendix is not  confidently identified. No bowel wall thickening, inflammation, or distension. Vascular/Lymphatic: Aortic atherosclerosis. No aneurysm. No signs abdominopelvic adenopathy. Reproductive: Uterus and bilateral adnexa are unremarkable. Other: No ascites or focal fluid collections. No signs of pneumoperitoneum. Musculoskeletal: No acute or significant osseous findings. IMPRESSION: 1. Dilatation of the proximal common bile duct measures up to 1.3 cm. Abrupt termination the dilated common bile duct at the level of the head of pancreas. There is also main duct dilatation which abruptly terminates at the level of the head of pancreas, proximal to the ampulla. Imaging findings are concerning for underlying obstructing lesion within the head of pancreas. Further evaluation with contrast enhanced MRI/MRCP is advised. 2. Stones  noted on recent gallbladder ultrasound are not CT visible. There is no gallbladder wall thickening or pericholecystic fluid identified. 3. 2 mm right upper lobe lung nodule. Nonspecific. See lung cancer screening CT from 04/15/2022 for follow-up recommendations. 4.  Aortic Atherosclerosis (ICD10-I70.0). Electronically Signed   By: Kerby Moors M.D.   On: 05/21/2022 13:50   US Abdomen Limited RUQ (LIVER/GB)  Result Date: 05/20/2022 CLINICAL DATA:  Pain. EXAM: ULTRASOUND ABDOMEN LIMITED RIGHT UPPER QUADRANT COMPARISON:  05/17/2022 FINDINGS: Gallbladder: Cholelithiasis with multiple stones filling the gallbladder. No gallbladder wall thickening or edema. Murphy's sign is negative. Common bile duct: Diameter: 13 mm, dilated and enlarging since previous study. Liver: No focal lesion identified. Within normal limits in parenchymal echogenicity. Portal vein is patent on color Doppler imaging with normal direction of blood flow towards the liver. Other: None. IMPRESSION: 1. Cholelithiasis without additional changes to suggest acute cholecystitis. 2. Dilated common bile duct at 13 mm, increasing since  previous study. Consider MRCP to exclude common duct stone or obstruction. Electronically Signed   By: Lucienne Capers M.D.   On: 05/20/2022 21:58   US Abdomen Limited RUQ (LIVER/GB)  Result Date: 05/17/2022 CLINICAL DATA:  Jaundice EXAM: ULTRASOUND ABDOMEN LIMITED RIGHT UPPER QUADRANT COMPARISON:  Chest CT 04/18/2022 FINDINGS: Gallbladder: Multiple stones in the gallbladder lumen. No gallbladder wall thickening or pericholecystic. Negative sonographic Murphy's sign. Common bile duct: Diameter: Focally prominent at 7.6 mm. Liver: Possible small focal calcification right hepatic lobe. Normal hepatic parenchymal echogenicity. Portal vein is patent on color Doppler imaging with normal direction of blood flow towards the liver. Other: None. IMPRESSION: 1. Cholelithiasis without secondary signs of acute cholecystitis. 2. Common bile duct is mildly prominent at 7.6 mm. Recommend correlation with LFTs. In the setting of abnormal LFTs, recommend correlation with MRI/MRCP. Electronically Signed   By: Lovey Newcomer M.D.   On: 05/17/2022 11:50    ASSESSMENT & PLAN:  62 yo ole female   AKI, likely secondary to dehydration Hyponatremia and metabolic acidosis, secondary to AKI and dehydration Hypotension, secondary to dehydration, resolved. Newly diagnosed pancreatic cancer, stage I Moderated protein and calorie malnutrition   Recommendations: -I have reviewed her recent lab, CT and MRI scan images, and discussed the findings with patient in detail. -She has a 1.9 cm tumor in the head of pancreas, no nodal or distant metastasis on CT scan.  The cytology from biliary brushing was positive for adenocarcinoma.  This is consistent with localized pancreatic cancer.   -I discussed pancreatic cancer treatment options, including Whipple surgery and chemotherapy.  She needs to recover well enough from this episode of AKI, to be considered for treatment. -I will present her case in GI tumor board in a few weeks. -I will  follow-up as needed when she is in the hospital. -Other management per primary care team and nephrology.  All questions were answered. The patient knows to call the clinic with any problems, questions or concerns.      Truitt Merle, MD 06/10/2022 5:57 PM

## 2022-06-10 NOTE — Telephone Encounter (Signed)
You are welcome. Looks like she went and saw the oncologist today.

## 2022-06-10 NOTE — H&P (Signed)
History and Physical    Patient: Michelle Williams [id number] DOB: 1960-08-21 DOA: 06/10/2022 DOS: the patient was seen and examined on 06/10/2022 PCP: Cipriano Mile, NP  Patient coming from:  Cancer center  Chief Complaint: Hypotensive at the cancer center   HPI: Michelle Williams is a 62 y.o. female with medical history significant for pancreatic cancer. Since her discharge on 05/28/2022 the pt has not been doing well at home. She has not been able to keep food or drink down consistently. She has been vomiting whatever she does eat and is failing to thrive. Today she went to the cancer center and was noted to be hypotensive (systolic in the [id number]). She was sent to the ER. She received several liters of IV fluids with improvement in her BP to 117/68. Lab work today revealed an initial BUN/Cr of 167/14.  She also has a new fluid collection in the RUQ. She will be admitted to the medicine serivce for further management.    Lastly, code status (chest compressions and ventilator) were discussed in the ER and the pt would like to talk with her loved ones prior to making a final decision.  Review of Systems: As mentioned in the history of present illness. All other systems reviewed and are negative. Past Medical History:  Diagnosis Date   Acute bronchitis    Allergy    Anemia    Anxiety    Arthritis    Cataract    BEGINIING   Cervical cancer ([place])    Degenerative joint disease    UPPER,LOWER BACK   Gallstones    H/O degenerative disc disease    H/O rheumatoid arthritis    Hypertension    Neuropathy    BILATERAL   Past Surgical History:  Procedure Laterality Date   BREAST EXCISIONAL BIOPSY Right    COLONOSCOPY     2018   ERCP N/A 05/23/2022   Procedure: ENDOSCOPIC RETROGRADE CHOLANGIOPANCREATOGRAPHY (ERCP);  Surgeon: Ladene Artist, MD;  Location: [place];  Service: Gastroenterology;  Laterality: N/A;   IR BILIARY DRAIN PLACEMENT WITH CHOLANGIOGRAM  05/25/2022   IR ENDOLUMINAL BX  OF BILIARY TREE  05/30/2022   IR FALLOPIAN TUBE CATHETERIZATION     IR INT EXT BILIARY DRAIN WITH CHOLANGIOGRAM  05/27/2022   POLYPECTOMY     Social History:  reports that she has been smoking cigarettes. She has been smoking an average of .5 packs per day. She has never been exposed to tobacco smoke. She has never used smokeless tobacco. She reports that she does not currently use alcohol. She reports that she does not use drugs.  No Known Allergies  Family History  Problem Relation Age of Onset   Hypertension Mother    Stroke Mother    Early death Father    Colon cancer Brother        unsure age when diagnosed    HIV/AIDS Brother    Diabetes Maternal Grandmother    Colon polyps Neg Hx    Esophageal cancer Neg Hx    Rectal cancer Neg Hx    Stomach cancer Neg Hx    Crohn's disease Neg Hx    Ulcerative colitis Neg Hx     Prior to Admission medications   Medication Sig Start Date End Date Taking? Authorizing Provider  albuterol (VENTOLIN HFA) 108 (90 Base) MCG/ACT inhaler Inhale into the lungs. 02/25/22   [provider]  cyclobenzaprine (FLEXERIL) 10 MG tablet Take 10 mg by mouth at bedtime. 12/12/16  [provider]  fluticasone (FLONASE) 50 MCG/ACT nasal spray Place 1 spray into both nostrils daily. Patient taking differently: Place 1 spray into both nostrils daily as needed for allergies. 02/19/22   Varney Biles, MD  levocetirizine (XYZAL) 5 MG tablet Take 5 mg by mouth at bedtime.    [provider]  lisinopril (ZESTRIL) 10 MG tablet Take 1 tablet (10 mg total) by mouth daily. 05/06/22   Jettie Booze, MD  montelukast (SINGULAIR) 10 MG tablet Take 10 mg by mouth at bedtime.    [provider]  ondansetron (ZOFRAN) 4 MG tablet Take 1 tablet (4 mg total) by mouth daily as needed for nausea or vomiting. 05/28/22 05/28/23  Farrel Gordon, DO  traZODone (DESYREL) 100 MG tablet Take 100 mg by mouth at bedtime. 03/28/22   [provider]   Varenicline Tartrate, Starter, (CHANTIX STARTING MONTH PAK) 0.5 MG X 11 & 1 MG X 42 TBPK Days 1 to 3: 0.5 mg once daily. Days 4 to 7: 0.5 mg twice daily. Maintenance (day 8 and later): 1 mg twice daily Patient taking differently: Take 0.5 mg by mouth 2 (two) times daily. 05/06/22   Jettie Booze, MD    Physical Exam: Vitals:   06/10/22 1150 06/10/22 1200 06/10/22 1210 06/10/22 1255  BP: 98/76 109/77 110/66 117/68  Pulse: 74 82 78 81  Resp: 15 20 16 12  $ Temp:      TempSrc:      SpO2: 100% 97% 100% 99%  Weight:      Height:       Physical Exam Constitutional:      Appearance: Normal appearance. She is ill-appearing.  HENT:     Head: Normocephalic and atraumatic.     Mouth/Throat:     Mouth: Mucous membranes are dry.  Cardiovascular:     Rate and Rhythm: Normal rate and regular rhythm.  Pulmonary:     Effort: Pulmonary effort is normal.     Breath sounds: Normal breath sounds.  Abdominal:     General: Abdomen is flat.  Musculoskeletal:        General: Normal range of motion.     Cervical back: Neck supple.  Skin:    General: Skin is warm.  Neurological:     Mental Status: She is alert. Mental status is at baseline.  Psychiatric:        Mood and Affect: Mood normal.    Data Reviewed:  There are no new results to review at this time.  Assessment and Plan: Acute renal failure  - Nephrology consulted (Dr. Jonnie Finner)  - IV sodium bicarb 150 meq in sterile water @ 150 cc/hr   Sepsis secondary to large RUQ fluid collection  - IV meropenem 1 g daily  - IR consulted on 06/10/2022 @ 1:20 pm Dr. Margaretmary Dys office. Narda Rutherford NP advised Dr. Kathlene Cote thinks it is an inflammatory phelgmon and does not require drainage at this time (and to have repeat CT imaging)   Acute hypoNa+ - IV fluids as above  - Appreciate nephrology assisting with IV fluid selection   Lactic acidosis  - IV fluids as above  - Lactic acid in AM to ensure abatement   Troponinemia  - ECHO  - This  troponin elevation is likely demand from the pt's infection and ARF  DVT prophylaxis: Heparin 5000 units sq q12    Advance Care Planning:   Code Status: Prior FULL   Consults: IR and nephrology   Severity of Illness:  The appropriate patient status for this patient is INPATIENT. Inpatient status is judged to be reasonable and necessary in order to provide the required intensity of service to ensure the patient's safety. The patient's presenting symptoms, physical exam findings, and initial radiographic and laboratory data in the context of their chronic comorbidities is felt to place them at high risk for further clinical deterioration. Furthermore, it is not anticipated that the patient will be medically stable for discharge from the hospital within 2 midnights of admission.   * I certify that at the point of admission it is my clinical judgment that the patient will require inpatient hospital care spanning beyond 2 midnights from the point of admission due to high intensity of service, high risk for further deterioration and high frequency of surveillance required.*  Author: Lucienne Minks , MD 06/10/2022 1:15 PM  For on call review [email or web address].

## 2022-06-11 DIAGNOSIS — K831 Obstruction of bile duct: Secondary | ICD-10-CM

## 2022-06-11 DIAGNOSIS — R933 Abnormal findings on diagnostic imaging of other parts of digestive tract: Secondary | ICD-10-CM

## 2022-06-11 DIAGNOSIS — I10 Essential (primary) hypertension: Secondary | ICD-10-CM

## 2022-06-11 DIAGNOSIS — N179 Acute kidney failure, unspecified: Secondary | ICD-10-CM | POA: Diagnosis not present

## 2022-06-11 LAB — URINALYSIS, W/ REFLEX TO CULTURE (INFECTION SUSPECTED)
Bilirubin Urine: NEGATIVE
Glucose, UA: NEGATIVE mg/dL
Hgb urine dipstick: NEGATIVE
Ketones, ur: NEGATIVE mg/dL
Leukocytes,Ua: NEGATIVE
Nitrite: NEGATIVE
Protein, ur: NEGATIVE mg/dL
Specific Gravity, Urine: 1.01 (ref 1.005–1.030)
pH: 5 (ref 5.0–8.0)

## 2022-06-11 LAB — COMPREHENSIVE METABOLIC PANEL
ALT: 56 U/L — ABNORMAL HIGH (ref 0–44)
AST: 40 U/L (ref 15–41)
Albumin: 2.4 g/dL — ABNORMAL LOW (ref 3.5–5.0)
Alkaline Phosphatase: 183 U/L — ABNORMAL HIGH (ref 38–126)
Anion gap: 13 (ref 5–15)
BUN: 113 mg/dL — ABNORMAL HIGH (ref 6–20)
CO2: 30 mmol/L (ref 22–32)
Calcium: 7 mg/dL — ABNORMAL LOW (ref 8.9–10.3)
Chloride: 89 mmol/L — ABNORMAL LOW (ref 98–111)
Creatinine, Ser: 4.49 mg/dL — ABNORMAL HIGH (ref 0.44–1.00)
GFR, Estimated: 11 mL/min — ABNORMAL LOW (ref >60)
Glucose, Bld: 116 mg/dL — ABNORMAL HIGH (ref 70–99)
Potassium: 3.2 mmol/L — ABNORMAL LOW (ref 3.5–5.1)
Sodium: 132 mmol/L — ABNORMAL LOW (ref 135–145)
Total Bilirubin: 4.3 mg/dL — ABNORMAL HIGH (ref 0.3–1.2)
Total Protein: 6.4 g/dL — ABNORMAL LOW (ref 6.5–8.1)

## 2022-06-11 LAB — PROTIME-INR
INR: 1.4 — ABNORMAL HIGH (ref 0.8–1.2)
Prothrombin Time: 17 seconds — ABNORMAL HIGH (ref 11.4–15.2)

## 2022-06-11 LAB — CBC
HCT: 24.7 % — ABNORMAL LOW (ref 36.0–46.0)
Hemoglobin: 8.8 g/dL — ABNORMAL LOW (ref 12.0–15.0)
MCH: 30.8 pg (ref 26.0–34.0)
MCHC: 35.6 g/dL (ref 30.0–36.0)
MCV: 86.4 fL (ref 80.0–100.0)
Platelets: 352 10*3/uL (ref 150–400)
RBC: 2.86 MIL/uL — ABNORMAL LOW (ref 3.87–5.11)
RDW: 14 % (ref 11.5–15.5)
WBC: 11.6 10*3/uL — ABNORMAL HIGH (ref 4.0–10.5)
nRBC: 0 % (ref 0.0–0.2)

## 2022-06-11 LAB — SODIUM, URINE, RANDOM: Sodium, Ur: 29 mmol/L

## 2022-06-11 LAB — LACTIC ACID, PLASMA: Lactic Acid, Venous: 0.9 mmol/L (ref 0.5–1.9)

## 2022-06-11 LAB — CREATININE, URINE, RANDOM: Creatinine, Urine: 108 mg/dL

## 2022-06-11 LAB — APTT: aPTT: 41 seconds — ABNORMAL HIGH (ref 24–36)

## 2022-06-11 LAB — HEMOGLOBIN A1C
Hgb A1c MFr Bld: 5.6 % (ref 4.8–5.6)
Mean Plasma Glucose: 114.02 mg/dL

## 2022-06-11 LAB — MAGNESIUM: Magnesium: 2.1 mg/dL (ref 1.7–2.4)

## 2022-06-11 LAB — PHOSPHORUS: Phosphorus: 6.6 mg/dL — ABNORMAL HIGH (ref 2.5–4.6)

## 2022-06-11 MED ORDER — SODIUM CHLORIDE 0.9 % IV SOLN: INTRAVENOUS | Status: DC

## 2022-06-11 MED ORDER — POTASSIUM CHLORIDE CRYS ER 20 MEQ PO TBCR
40.0000 meq | EXTENDED_RELEASE_TABLET | Freq: Once | ORAL | Status: AC
  Administered 2022-06-11: 40 meq via ORAL
  Filled 2022-06-11: qty 2

## 2022-06-11 MED ORDER — SODIUM CHLORIDE 0.9 % IV SOLN
500.0000 mg | Freq: Two times a day (BID) | INTRAVENOUS | Status: DC
  Administered 2022-06-11 – 2022-06-12 (×2): 500 mg via INTRAVENOUS
  Filled 2022-06-11 (×3): qty 10

## 2022-06-11 NOTE — Progress Notes (Signed)
Mobility Specialist - Progress Note   06/11/22 1318  Mobility  Activity Ambulated with assistance in hallway  Level of Assistance Contact guard assist, steadying assist  Assistive Device Front wheel walker  Distance Ambulated (ft) 60 ft  Activity Response Tolerated fair  Mobility Referral Yes  $Mobility charge 1 Mobility   Pt was found in bed and agreeable to ambulate. Pt was unsteady ambulating and stating she is not normally like this. Pt was contact assist for ambulation and had a knee buckle. At EOS returned to bed with all necessities in reach.   Ferd Hibbs Mobility Specialist

## 2022-06-11 NOTE — Progress Notes (Addendum)
[facility] Progress Note  Subjective: Michelle Williams yest and today so far. Na+ up at 132, BUN 113, creat down 4.5. AG closed.   Vitals:   06/10/22 2147 06/11/22 0205 06/11/22 0429 06/11/22 0500  BP: 98/63 (!) 94/56 (!) 102/59   Pulse: 96 83 85   Resp: 18 20 (!) 22   Temp: 98.6 F (37 C) 98.5 F (36.9 C) 98.7 F (37.1 C)   TempSrc: Oral Oral Oral   SpO2: 100% 94% 99%   Weight:    69 kg  Height:        Exam: Gen alert, no distress, adult AAF throat clear w/ dry mouth No jvd or bruits Chest clear bilat to bases RRR no MRG Abd soft ntnd no mass or ascites +bs Ext no LE edema Neuro is alert, Ox 3 , nf, mild asterixis      Home meds include - albuterol, lisinopril, singulair, trazodone, chantix, prns      UA 2/10 - negative    UNa, UCr - pend    Abd CT -- > Adrenals/Urinary Tract: Adrenal glands, kidneys, ureters, and bladder normal appearance.       Assessment/ Plan: AKI - normal creat recently was 0.74 in late Jan 2024. Pt was just in hospital for w/u of obstructive jaundice. Creat here was 14 on admission 2/09. BP's here were low in the 70s initially in the ED. She got 3 L bolus in ED which improved the BP. AKI is suspected hemodynamic due to hypotension/ vol depletion + ACEi effects. She still looked dry post 3 L bolus so we gave bolus 1.5 L and continued IVF at 150 cc/hr. Also foley cath was placed and ACEi on hold. Bun/Cr down to 113/ 4.49 this am, much better. Suspect she will continue to recover from her AKI. Recommend:  - continue to avoid ACEi / ARB for 1 month - cont IVF"s, lowered to 100 /hr, wean off per pmd - no other suggestions, will sign off - if needs renal f/u at discharge please let us know  Hyponatremia - due to hypovolemia, resolved Metabolic acidosis - +AG which is typical for severe AKI. AG closed and serum CO2 30 today, changed IVF's to 0.9% saline.  Jaundice - recent admit w/ ERCP, concern for poss panc or biliary Ca     Kelly Splinter  MD [facility] 06/11/2022, 10:13 AM  Recent Labs  Lab 06/10/22 0927 06/10/22 1121 06/11/22 0520  HGB 12.1  --  8.8*  ALBUMIN 3.2*  --  2.4*  CALCIUM 8.5* 7.2* 7.0*  PHOS  --   --  6.6*  CREATININE 14.38* 12.44* 4.49*  K 4.9 4.6 3.2*   No results for input(s): "IRON", "TIBC", "FERRITIN" in the last 168 hours. Inpatient medications:  heparin injection (subcutaneous)  5,000 Units Subcutaneous Q12H    meropenem (MERREM) IV 1 g (06/10/22 1824)   sodium bicarbonate 150 mEq in sterile water 1,150 mL infusion 150 mL/hr at 06/11/22 0811   mouth rinse

## 2022-06-11 NOTE — Progress Notes (Signed)
PHARMACY NOTE:  ANTIMICROBIAL RENAL DOSAGE ADJUSTMENT  Current antimicrobial regimen includes a mismatch between antimicrobial dosage and estimated renal function.  As per policy approved by the Pharmacy & Therapeutics and Medical Executive Committees, the antimicrobial dosage will be adjusted accordingly.  Current antimicrobial dosage:  Meropenem 1 g IV q24h  Indication: Intra-abdominal infection  Renal Function:  Estimated Creatinine Clearance: 11.5 mL/min (A) (by C-G formula based on SCr of 4.49 mg/dL (H)). []$      On intermittent HD, scheduled: []$      On CRRT    Antimicrobial dosage has been changed to:  Meropenem 500 mg IV q12h  Lenis Noon, PharmD 06/11/22 12:26 PM

## 2022-06-11 NOTE — Progress Notes (Addendum)
PROGRESS NOTE    OLETTA MICHELINI  [id number] DOB: [birth year] DOA: 06/10/2022 PCP: Cipriano Mile, NP    Brief Narrative:  ALMER FENNESSEY is a 62 y.o. female with medical history significant for pancreatic cancer who was discharged recently from the hospital  on 05/28/2022 presented to hospital with not doing well and unable to keep food and drink down with vomiting and failure to thrive.  Patient then went to cancer center and was noted to be hypotensive so she was sent into the ED.  In the ED, initial creatinine was 12.4 with elevated bilirubin level at 4.3 patient received several IV fluid boluses with improvement in the blood pressure.  Chest x-ray with low lung volumes without acute infiltrate.  CT scan of the abdomen and pelvis showed new diffuse infiltrative process in the right upper quadrant anterolateral to pancreas approximately 11 x 6.8 x 8.5 cm in size.  Patient was then considered for admission to the hospital for further evaluation and treatment.     Assessment and Plan:  Acute kidney injury likely secondary to volume depletion with metabolic acidosis.. Initial creatinine at 12.4.  Nephrology has been consulted.  On sodium bicarb drip at 150 mill per hour.  Will closely monitor renal function.  Creatinine today at 4.4.  Will continue to monitor.  Strict intake and output charting, daily weights.  Avoid nephrotoxic medications if possible.  Patient received multiple IV fluid bolus on admission.  Baseline creatinine 0.08 May 2022.  Newly diagnosed pancreatic cancer stage I with obstructive jaundice..  Being followed up by Dr. Burr Medico oncology.  Will need to follow-up with oncology for treatment options once medically stable..  Bilirubin elevated at 4.3.  Status post ERCP with the internal/external biliary drain in the past.  Hypotension secondary to volume depletion.  Has improved with IV f.  Luid bolus.  Currently on IV fluids.  Mild hypokalemia.  Will replenish.  Check levels in  AM.  Magnesium of 2.1.  Sepsis secondary to large RUQ fluid collection  Continue IV meropenem 1 g daily. IR consulted on 06/10/2022 @ 1:20 pm Dr. Margaretmary Dys office. Narda Rutherford NP advised Dr. Kathlene Cote thinks it is an inflammatory phelgmon and does not require drainage at this time (and to have repeat CT imaging).  Leukocytosis at 11.6 from initial 16.9.  Will continue to monitor.  Urinalysis was notable for white cells and 11-20.  Follow-up blood cultures, urine cultures.  Will advance diet to soft today since patient appears to be hungry and has tolerated clears.   Acute hyponatremia. Likely from volume depletion and poor oral intake.  Continue IV fluids, check BMP in AM.  Sodium level of 132 today from initial 125.   Lactic acidosis  Continue IV fluids.  Latest lactic acid at 0.9.  Initial lactate elevated at 2.5.   Elevated troponin.  No chest pain or EKG changes.  Likely demand ischemia from patient's infection, acute kidney injury.    DVT prophylaxis: heparin injection 5,000 Units Start: 06/10/22 1345   Code Status:     Code Status: full  Disposition: Home likely in 2 to 3 days.  Status is: Inpatient  Remains inpatient appropriate because: Acute kidney injury, volume depletion,   Family Communication: Spoke with the patient's family at bedside.  Consultants:  Oncology Interventional radiology Nephrology  Procedures:  None  Antimicrobials:  Meropenem  Anti-infectives (From admission, onward)    Start     Dose/Rate Route Frequency Ordered Stop   06/10/22 1400  meropenem (MERREM)  1 g in sodium chloride 0.9 % 100 mL IVPB        1 g 200 mL/hr over 30 Minutes Intravenous Every 24 hours 06/10/22 1326     06/10/22 0930  ceFEPIme (MAXIPIME) 2 g in sodium chloride 0.9 % 100 mL IVPB        2 g 200 mL/hr over 30 Minutes Intravenous  Once 06/10/22 0926 06/10/22 1125   06/10/22 0930  metroNIDAZOLE (FLAGYL) IVPB 500 mg        500 mg 100 mL/hr over 60 Minutes Intravenous  Once  06/10/22 0926 06/10/22 1144      Subjective: Today, patient was seen and examined at bedside.  Patient states that she feels a little better today in was able to tolerate clears.  No nausea vomiting or obvious abdominal pain.  Feels hungry and wishes to eat some.  Patient's brother and family at bedside.  Objective: Vitals:   06/10/22 2147 06/11/22 0205 06/11/22 0429 06/11/22 0500  BP: 98/63 (!) 94/56 (!) 102/59   Pulse: 96 83 85   Resp: 18 20 (!) 22   Temp: 98.6 F (37 C) 98.5 F (36.9 C) 98.7 F (37.1 C)   TempSrc: Oral Oral Oral   SpO2: 100% 94% 99%   Weight:    69 kg  Height:        Intake/Output Summary (Last 24 hours) at 06/11/2022 1037 Last data filed at 06/11/2022 0825 Gross per 24 hour  Intake 6620.76 ml  Output 2893 ml  Net 3727.76 ml   Filed Weights   06/10/22 0918 06/11/22 0500  Weight: 65.7 kg 69 kg    Physical Examination: Body mass index is 29.71 kg/m.  General:  Average built, not in obvious distress, alert awake and Communicative HENT:   Icterus noted.  Mild pallor.. Oral mucosa is dry Chest:  Clear breath sounds.  Diminished breath sounds bilaterally. No crackles or wheezes.  CVS: S1 &S2 heard. No murmur.  Regular rate and rhythm. Abdomen: Soft, no specific tenderness on palpation. nondistended.  Bowel sounds are heard.   Extremities: No cyanosis, clubbing or edema.  Peripheral pulses are palpable. Psych: Alert, awake and oriented, normal mood CNS:  No cranial nerve deficits.  Power equal in all extremities.   Skin: Warm and dry.  No rashes noted.  Data Reviewed:   CBC: Recent Labs  Lab 06/10/22 0927 06/11/22 0520  WBC 16.9* 11.6*  NEUTROABS 13.2*  --   HGB 12.1 8.8*  HCT 34.4* 24.7*  MCV 85.1 86.4  PLT 514* [id number]    Basic Metabolic Panel: Recent Labs  Lab 06/10/22 0927 06/10/22 1121 06/11/22 0520  NA 124* 125* 132*  K 4.9 4.6 3.2*  CL 76* 84* 89*  CO2 16* 16* 30  GLUCOSE 135* 97 116*  BUN 167* 137* 113*  CREATININE 14.38*  12.44* 4.49*  CALCIUM 8.5* 7.2* 7.0*  MG  --   --  2.1  PHOS  --   --  6.6*    Liver Function Tests: Recent Labs  Lab 06/10/22 0927 06/11/22 0520  AST 48* 40  ALT 75* 56*  ALKPHOS 270* 183*  BILITOT 5.7* 4.3*  PROT 8.5* 6.4*  ALBUMIN 3.2* 2.4*     Radiology Studies: ECHOCARDIOGRAM COMPLETE  Result Date: 06/10/2022    ECHOCARDIOGRAM REPORT   Patient Name:   MARLEI HARRAH Date of Exam: 06/10/2022 Medical Rec #:  [id number]       Height:       60.0 in Accession #:  [id number]      Weight:       144.9 lb Date of Birth:  [birth year]       BSA:          1.628 m Patient Age:    66 years        BP:           124/74 mmHg Patient Gender: F               HR:           81 bpm. Exam Location:  Inpatient Procedure: 2D Echo, Cardiac Doppler and Color Doppler Indications:    Elevated Troponin  History:        Patient has prior history of Echocardiogram examinations, most                 recent 06/01/2022. Acute renal failure; Risk Factors:Hypertension                 and Current Smoker.  Sonographer:    Wilkie Aye RVT RCS Referring Phys: [id number] [facility]  Sonographer Comments: Image acquisition challenging due to respiratory motion. IMPRESSIONS  1. Left ventricular ejection fraction, by estimation, is 70 to 75%. The left ventricle has hyperdynamic function. The left ventricle has no regional wall motion abnormalities. There is mild left ventricular hypertrophy. Left ventricular diastolic parameters are consistent with Grade I diastolic dysfunction (impaired relaxation). LVOT gradient 9 mmHg  2. Right ventricular systolic function is normal. The right ventricular size is normal. There is normal pulmonary artery systolic pressure. The estimated right ventricular systolic pressure is 0000000 mmHg.  3. The mitral valve is normal in structure. Mild mitral valve regurgitation.  4. The aortic valve is tricuspid. Aortic valve regurgitation is not visualized. No aortic stenosis is present.  5. The inferior vena cava  is normal in size with greater than 50% respiratory variability, suggesting right atrial pressure of 3 mmHg. FINDINGS  Left Ventricle: Left ventricular ejection fraction, by estimation, is 70 to 75%. The left ventricle has hyperdynamic function. The left ventricle has no regional wall motion abnormalities. The left ventricular internal cavity size was small. There is mild left ventricular hypertrophy. Left ventricular diastolic parameters are consistent with Grade I diastolic dysfunction (impaired relaxation). Right Ventricle: The right ventricular size is normal. No increase in right ventricular wall thickness. Right ventricular systolic function is normal. There is normal pulmonary artery systolic pressure. The tricuspid regurgitant velocity is 2.49 m/s, and  with an assumed right atrial pressure of 3 mmHg, the estimated right ventricular systolic pressure is 0000000 mmHg. Left Atrium: Left atrial size was normal in size. Right Atrium: Right atrial size was normal in size. Pericardium: There is no evidence of pericardial effusion. Mitral Valve: The mitral valve is normal in structure. Mild mitral valve regurgitation. Tricuspid Valve: The tricuspid valve is normal in structure. Tricuspid valve regurgitation is trivial. Aortic Valve: The aortic valve is tricuspid. Aortic valve regurgitation is not visualized. No aortic stenosis is present. Aortic valve mean gradient measures 5.0 mmHg. Aortic valve peak gradient measures 11.4 mmHg. Aortic valve area, by VTI measures 4.54  cm. Pulmonic Valve: The pulmonic valve was not well visualized. Pulmonic valve regurgitation is not visualized. Aorta: The aortic root and ascending aorta are structurally normal, with no evidence of dilitation. Venous: The inferior vena cava is normal in size with greater than 50% respiratory variability, suggesting right atrial pressure of 3 mmHg. IAS/Shunts: The interatrial septum was not well visualized.  LEFT VENTRICLE  PLAX 2D LVIDd:         3.00  cm   Diastology LVIDs:         2.20 cm   LV e' medial:    3.48 cm/s LV PW:         1.50 cm   LV E/e' medial:  23.2 LV IVS:        1.20 cm   LV e' lateral:   5.66 cm/s LVOT diam:     2.00 cm   LV E/e' lateral: 14.3 LV SV:         117 LV SV Index:   72 LVOT Area:     3.14 cm  RIGHT VENTRICLE             IVC RV Basal diam:  4.10 cm     IVC diam: 1.40 cm RV S prime:     25.60 cm/s TAPSE (M-mode): 1.9 cm LEFT ATRIUM             Index        RIGHT ATRIUM          Index LA diam:        3.00 cm 1.84 cm/m   RA Area:     9.87 cm LA Vol (A2C):   25.1 ml 15.42 ml/m  RA Volume:   20.70 ml 12.72 ml/m LA Vol (A4C):   31.7 ml 19.48 ml/m LA Biplane Vol: 28.2 ml 17.33 ml/m  AORTIC VALVE                     PULMONIC VALVE AV Area (Vmax):    3.90 cm      PV Vmax:       1.00 m/s AV Area (Vmean):   4.21 cm      PV Peak grad:  4.0 mmHg AV Area (VTI):     4.54 cm AV Vmax:           169.00 cm/s AV Vmean:          99.300 cm/s AV VTI:            0.258 m AV Peak Grad:      11.4 mmHg AV Mean Grad:      5.0 mmHg LVOT Vmax:         210.00 cm/s LVOT Vmean:        133.000 cm/s LVOT VTI:          0.373 m LVOT/AV VTI ratio: 1.45  AORTA Ao Root diam: 3.00 cm Ao Asc diam:  3.40 cm Ao Arch diam: 2.2 cm MITRAL VALVE                TRICUSPID VALVE MV Area (PHT): 2.66 cm     TR Peak grad:   24.8 mmHg MV Decel Time: 285 msec     TR Vmax:        249.00 cm/s MV E velocity: 81.00 cm/s MV A velocity: 109.00 cm/s  SHUNTS MV E/A ratio:  0.74         Systemic VTI:  0.37 m                             Systemic Diam: 2.00 cm Oswaldo Milian MD Electronically signed by Oswaldo Milian MD Signature Date/Time: 06/10/2022/5:47:15 PM    Final    CT ABDOMEN PELVIS WO CONTRAST  Result Date: 06/10/2022 CLINICAL DATA:  Acute abdominal pain, pancreatic mass, biliary  drain, new renal failure, weakness, nausea, vomiting EXAM: CT ABDOMEN AND PELVIS WITHOUT CONTRAST TECHNIQUE: Multidetector CT imaging of the abdomen and pelvis was performed following the  standard protocol without IV contrast. RADIATION DOSE REDUCTION: This exam was performed according to the departmental dose-optimization program which includes automated exposure control, adjustment of the mA and/or kV according to patient size and/or use of iterative reconstruction technique. COMPARISON:  Of 2024 FINDINGS: Lower chest: Dependent bibasilar atelectasis and lower lobe bronchiectasis Hepatobiliary: Percutaneous biliary drain traverses RIGHT lobe liver and CBD into duodenum, pigtail catheter at third portion. Gallbladder decompressed. No focal hepatic mass. Pancreas: Ill-defined pancreatic head. Remainder of pancreas unremarkable. Spleen: Normal appearance Adrenals/Urinary Tract: Adrenal glands, kidneys, ureters, and bladder normal appearance Stomach/Bowel: Increased stool in rectum. Scattered stool in remainder of colon. Stomach unremarkable. Infiltrative changes adjacent to duodenum. Remaining bowel loops unremarkable. Vascular/Lymphatic: Atherosclerotic calcification aorta and iliac arteries. Few pelvic phleboliths. Reproductive: Unremarkable uterus and adnexa Other: No free air or free fluid. New diffuse infiltrative process identified within the RIGHT upper quadrant, posterior and medial to the hepatic flexure of the colon, anterolateral to the pancreas, approximately 11.0 x 6.8 cm in size, 8.5 cm length, ill-defined. Mild associated wall thickening of the hepatic flexure of the colon with anterolateral colonic displacement. Fat within this region on the previous CT was clear. Musculoskeletal: No acute osseous findings. IMPRESSION: New diffuse infiltrative process within the RIGHT upper quadrant, posterior and medial to the hepatic flexure of the colon, anterolateral to the pancreas, approximately 11.0 x 6.8 x 8.5 cm in size, with mild associated wall thickening of the hepatic flexure of the colon with anterolateral colonic displacement. Fat within this region on the previous exam was clear.  Differential diagnosis includes hemorrhage, infection, or marked tumor progression, atypical for tumor progression due to the short interval since previous CT. Recommend correlation with hemoglobin and white count. Electronically Signed   By: Lavonia Dana M.D.   On: 06/10/2022 13:16   DG Chest Port 1 View  Result Date: 06/10/2022 CLINICAL DATA:  Questionable sepsis. EXAM: PORTABLE CHEST 1 VIEW COMPARISON:  02/19/2022. FINDINGS: 0933 hours. Low lung volumes accentuate the pulmonary vasculature and cardiomediastinal silhouette. No focal airspace opacity. Otherwise stable cardiac and mediastinal contours. No pleural effusion or pneumothorax. IMPRESSION: Low lung volumes without evidence of acute cardiopulmonary disease. Electronically Signed   By: Emmit Alexanders M.D.   On: 06/10/2022 09:38      LOS: 1 day     Flora Lipps, MD [facility] Available via Epic secure chat 7am-7pm After these hours, please refer to coverage provider listed on [email or web address] 06/11/2022, 10:37 AM

## 2022-06-11 NOTE — Progress Notes (Signed)
  Transition of Care [facility]) Screening Note   Patient Details  Name: KIMARA BENCOMO Date of Birth: [birth year]   Transition of Care [facility]) CM/SW Contact:    Henrietta Dine, RN Phone Number: 06/11/2022, 2:56 PM    Transition of Care Department [facility]) has reviewed patient and no TOC needs have been identified at this time. We will continue to monitor patient advancement through interdisciplinary progression rounds. If new patient transition needs arise, please place a TOC consult.

## 2022-06-12 DIAGNOSIS — K831 Obstruction of bile duct: Secondary | ICD-10-CM | POA: Diagnosis not present

## 2022-06-12 DIAGNOSIS — N179 Acute kidney failure, unspecified: Secondary | ICD-10-CM | POA: Diagnosis not present

## 2022-06-12 DIAGNOSIS — R933 Abnormal findings on diagnostic imaging of other parts of digestive tract: Secondary | ICD-10-CM | POA: Diagnosis not present

## 2022-06-12 DIAGNOSIS — I10 Essential (primary) hypertension: Secondary | ICD-10-CM | POA: Diagnosis not present

## 2022-06-12 LAB — COMPREHENSIVE METABOLIC PANEL
ALT: 52 U/L — ABNORMAL HIGH (ref 0–44)
AST: 39 U/L (ref 15–41)
Albumin: 2.4 g/dL — ABNORMAL LOW (ref 3.5–5.0)
Alkaline Phosphatase: 148 U/L — ABNORMAL HIGH (ref 38–126)
Anion gap: 13 (ref 5–15)
BUN: 62 mg/dL — ABNORMAL HIGH (ref 6–20)
CO2: 31 mmol/L (ref 22–32)
Calcium: 7.3 mg/dL — ABNORMAL LOW (ref 8.9–10.3)
Chloride: 94 mmol/L — ABNORMAL LOW (ref 98–111)
Creatinine, Ser: 1.09 mg/dL — ABNORMAL HIGH (ref 0.44–1.00)
GFR, Estimated: 58 mL/min — ABNORMAL LOW (ref >60)
Glucose, Bld: 119 mg/dL — ABNORMAL HIGH (ref 70–99)
Potassium: 3.5 mmol/L (ref 3.5–5.1)
Sodium: 138 mmol/L (ref 135–145)
Total Bilirubin: 4.2 mg/dL — ABNORMAL HIGH (ref 0.3–1.2)
Total Protein: 6.1 g/dL — ABNORMAL LOW (ref 6.5–8.1)

## 2022-06-12 LAB — MAGNESIUM: Magnesium: 1.5 mg/dL — ABNORMAL LOW (ref 1.7–2.4)

## 2022-06-12 LAB — CBC
HCT: 24.9 % — ABNORMAL LOW (ref 36.0–46.0)
Hemoglobin: 8.3 g/dL — ABNORMAL LOW (ref 12.0–15.0)
MCH: 30.4 pg (ref 26.0–34.0)
MCHC: 33.3 g/dL (ref 30.0–36.0)
MCV: 91.2 fL (ref 80.0–100.0)
Platelets: 328 10*3/uL (ref 150–400)
RBC: 2.73 MIL/uL — ABNORMAL LOW (ref 3.87–5.11)
RDW: 14.1 % (ref 11.5–15.5)
WBC: 12.7 10*3/uL — ABNORMAL HIGH (ref 4.0–10.5)
nRBC: 0 % (ref 0.0–0.2)

## 2022-06-12 MED ORDER — TRAZODONE HCL 100 MG PO TABS: 100.0000 mg | ORAL_TABLET | Freq: Every evening | ORAL | Status: DC | PRN

## 2022-06-12 MED ORDER — CYCLOBENZAPRINE HCL 10 MG PO TABS: 10.0000 mg | ORAL_TABLET | Freq: Every evening | ORAL | Status: DC | PRN

## 2022-06-12 MED ORDER — SODIUM CHLORIDE 0.9 % IV SOLN
1.0000 g | Freq: Two times a day (BID) | INTRAVENOUS | Status: DC
  Filled 2022-06-12: qty 20

## 2022-06-12 MED ORDER — FLUTICASONE PROPIONATE 50 MCG/ACT NA SUSP: 1.0000 | Freq: Every day | NASAL | Status: DC | PRN

## 2022-06-12 MED ORDER — METRONIDAZOLE 500 MG/100ML IV SOLN
500.0000 mg | Freq: Two times a day (BID) | INTRAVENOUS | Status: DC
  Administered 2022-06-12 – 2022-06-13 (×2): 500 mg via INTRAVENOUS
  Filled 2022-06-12 (×2): qty 100

## 2022-06-12 MED ORDER — ALBUTEROL SULFATE (2.5 MG/3ML) 0.083% IN NEBU: 3.0000 mL | INHALATION_SOLUTION | Freq: Four times a day (QID) | RESPIRATORY_TRACT | Status: DC | PRN

## 2022-06-12 MED ORDER — MAGNESIUM SULFATE 2 GM/50ML IV SOLN
2.0000 g | Freq: Once | INTRAVENOUS | Status: AC
  Administered 2022-06-12: 2 g via INTRAVENOUS
  Filled 2022-06-12: qty 50

## 2022-06-12 MED ORDER — ONDANSETRON HCL 4 MG PO TABS: 4.0000 mg | ORAL_TABLET | Freq: Every day | ORAL | Status: DC | PRN

## 2022-06-12 MED ORDER — PHENOL 1.4 % MT LIQD: 1.0000 | OROMUCOSAL | Status: DC | PRN

## 2022-06-12 MED ORDER — CETIRIZINE HCL 10 MG PO TABS: 10.0000 mg | ORAL_TABLET | Freq: Every evening | ORAL | Status: DC | PRN

## 2022-06-12 MED ORDER — MONTELUKAST SODIUM 10 MG PO TABS
10.0000 mg | ORAL_TABLET | Freq: Every day | ORAL | Status: DC
  Administered 2022-06-12: 10 mg via ORAL
  Filled 2022-06-12: qty 1

## 2022-06-12 MED ORDER — SODIUM CHLORIDE 0.9 % IV SOLN
2.0000 g | INTRAVENOUS | Status: DC
  Administered 2022-06-12: 2 g via INTRAVENOUS
  Filled 2022-06-12: qty 20

## 2022-06-12 NOTE — Progress Notes (Signed)
PHARMACY NOTE:  ANTIMICROBIAL RENAL DOSAGE ADJUSTMENT  Current antimicrobial regimen includes a mismatch between antimicrobial dosage and estimated renal function.  As per policy approved by the Pharmacy & Therapeutics and Medical Executive Committees, the antimicrobial dosage will be adjusted accordingly.  Current antimicrobial dosage:  Meropenem 500 mg IV q12h  Indication: Intra-abdominal infection  Renal Function: Continues to improve  Estimated Creatinine Clearance: 47.6 mL/min (A) (by C-G formula based on SCr of 1.09 mg/dL (H)). []$      On intermittent HD, scheduled: []$      On CRRT    Antimicrobial dosage has been changed to:  Meropenem 1000 mg IV q12h  Lenis Noon, PharmD 06/12/22 1:48 PM

## 2022-06-12 NOTE — Progress Notes (Addendum)
PROGRESS NOTE    Michelle Williams  [id number] DOB: 10-Dec-1960 DOA: 06/10/2022 PCP: Cipriano Mile, NP    Brief Narrative:  Michelle Williams is a 62 y.o. female with medical history significant for pancreatic cancer who was discharged recently from the hospital  on 05/28/2022 presented to hospital with not doing well and unable to keep food and drink down with vomiting and failure to thrive.  Patient then went to cancer center and was noted to be hypotensive so she was sent into the ED.  In the ED, initial creatinine was 12.4 with elevated bilirubin level at 4.3 patient received several IV fluid boluses with improvement in the blood pressure.  Chest x-ray with low lung volumes without acute infiltrate.  CT scan of the abdomen and pelvis showed new diffuse infiltrative process in the right upper quadrant anterolateral to pancreas approximately 11 x 6.8 x 8.5 cm in size.  Patient was then considered for admission to the hospital for further evaluation and treatment.    Assessment and Plan:  Acute kidney injury likely secondary to volume depletion with metabolic acidosis.. Initial creatinine at 12.4.  Nephrology was consulted and patient was on  sodium bicarb drip.  Creatinine today has significantly improved and normalized at 1.09.   Avoid nephrotoxic medications Patient received multiple IV fluid bolus on admission.  Baseline creatinine 0.08 May 2022.  Continue IV fluids for 1 more day.  Continue to hold lisinopril from home.  Newly diagnosed pancreatic cancer stage I with obstructive jaundice.   Being followed up by Dr. Burr Medico oncology.  Will need to follow-up with oncology for treatment options once medically stable..  Bilirubin elevated at 4.2.  Status post ERCP with the internal/external biliary drain in .  Drain in draining well.  Hypotension secondary to volume depletion.  Has improved with IV fluids.  Currently on IV fluids.  Will continue will continue for 1 more day.  Latest blood pressure  of 121/85.  Mild hypokalemia.  Will replenish.  Check levels in AM.  Magnesium of 2.1.  Sepsis secondary to large RUQ fluid collection  Continue IV meropenem 1 g daily but can change to Rocephin and Flagyl for now.. IR consulted on 06/10/2022 @ 1:20 pm Dr. Margaretmary Dys office. Michelle Rutherford NP advised Dr. Kathlene Cote thinks it is an inflammatory phelgmon and does not require drainage at this time (and to have repeat CT imaging). Will continue to monitor.  Urinalysis was notable for white cells and 11-20.  Follow-up blood cultures, urine cultures.  Will advance diet to soft today since patient appears to be hungry and has tolerated clears.  Temperature max of 99.4 F.  WBC count at 12.7.  Will continue to monitor.  Will consider discontinuation of antibiotic by tomorrow if continues to improve.  Hypomagnesemia.  Will replenish with 2 g of magnesium sulfate.  Check levels in AM.   Acute hyponatremia. Resolved at this time.  Likely from volume depletion and poor oral intake.  Continue IV fluids, check BMP in AM.  Sodium level of 138 from initial 125.   Lactic acidosis  Continue IV fluids.  Latest lactic acid at 0.9.  Initial lactate elevated at 2.5.   Elevated troponin.  No chest pain or EKG changes.  Likely demand ischemia from patient's infection, acute kidney injury.    DVT prophylaxis: heparin injection 5,000 Units Start: 06/10/22 1345   Code Status:     Code Status: full  Disposition: Home likely on 06/13/2022 if continues to improve.  Status is:  Inpatient  Remains inpatient appropriate because: Acute kidney injury, volume depletion, IV antibiotic   Family Communication:  Spoke with the patient's brother at bedside.  Consultants:  Oncology Interventional radiology Nephrology  Procedures:  None  Antimicrobials:  Meropenem IV  Anti-infectives (From admission, onward)    Start     Dose/Rate Route Frequency Ordered Stop   06/12/22 2000  meropenem (MERREM) 1 g in sodium chloride 0.9  % 100 mL IVPB        1 g 200 mL/hr over 30 Minutes Intravenous Every 12 hours 06/12/22 1347     06/11/22 1800  meropenem (MERREM) 500 mg in sodium chloride 0.9 % 100 mL IVPB  Status:  Discontinued        500 mg 200 mL/hr over 30 Minutes Intravenous Every 12 hours 06/11/22 1228 06/12/22 1347   06/10/22 1400  meropenem (MERREM) 1 g in sodium chloride 0.9 % 100 mL IVPB  Status:  Discontinued        1 g 200 mL/hr over 30 Minutes Intravenous Every 24 hours 06/10/22 1326 06/11/22 1228   06/10/22 0930  ceFEPIme (MAXIPIME) 2 g in sodium chloride 0.9 % 100 mL IVPB        2 g 200 mL/hr over 30 Minutes Intravenous  Once 06/10/22 0926 06/10/22 1125   06/10/22 0930  metroNIDAZOLE (FLAGYL) IVPB 500 mg        500 mg 100 mL/hr over 60 Minutes Intravenous  Once 06/10/22 0926 06/10/22 1144      Subjective: Today, patient was seen and examined at bedside.  Has some sore throat but tolerated oral diet.  Denies any pain, nausea, vomiting but little lightheaded when trying to stand up.  Denies any fever, chills or rigor.  Objective: Vitals:   06/11/22 0500 06/11/22 1218 06/12/22 0500 06/12/22 1306  BP:  102/61 125/71 121/85  Pulse:  82 85 86  Resp:   18   Temp:  98.9 F (37.2 C) 99.4 F (37.4 C) 99.1 F (37.3 C)  TempSrc:  Oral Oral Oral  SpO2:  100% 98% 95%  Weight: 69 kg     Height:        Intake/Output Summary (Last 24 hours) at 06/12/2022 1536 Last data filed at 06/12/2022 1200 Gross per 24 hour  Intake 2220.28 ml  Output 2860 ml  Net -639.72 ml    Filed Weights   06/10/22 0918 06/11/22 0500  Weight: 65.7 kg 69 kg    Physical Examination: Body mass index is 29.71 kg/m.   General: Alert awake and Communicative, not in obvious distress, HENT:   Mild pallor and icterus noted. Chest:  Clear breath sounds.  Diminished breath sounds bilaterally. No crackles or wheezes.  CVS: S1 &S2 heard. No murmur.  Regular rate and rhythm. Abdomen: Soft, right upper quadrant biliary drain in place,  nontender.  Bowel sounds are heard.   Extremities: No cyanosis, clubbing or edema.  Peripheral pulses are palpable. Psych: Alert, awake and oriented, normal mood CNS:  No cranial nerve deficits.  Power equal in all extremities.   Skin: Warm and dry.  No rashes noted.  Data Reviewed:   CBC: Recent Labs  Lab 06/10/22 0927 06/11/22 0520 06/12/22 0432  WBC 16.9* 11.6* 12.7*  NEUTROABS 13.2*  --   --   HGB 12.1 8.8* 8.3*  HCT 34.4* 24.7* 24.9*  MCV 85.1 86.4 91.2  PLT 514* 352 328     Basic Metabolic Panel: Recent Labs  Lab 06/10/22 0927 06/10/22 1121 06/11/22 0520  06/12/22 0432  NA 124* 125* 132* 138  K 4.9 4.6 3.2* 3.5  CL 76* 84* 89* 94*  CO2 16* 16* 30 31  GLUCOSE 135* 97 116* 119*  BUN 167* 137* 113* 62*  CREATININE 14.38* 12.44* 4.49* 1.09*  CALCIUM 8.5* 7.2* 7.0* 7.3*  MG  --   --  2.1 1.5*  PHOS  --   --  6.6*  --      Liver Function Tests: Recent Labs  Lab 06/10/22 0927 06/11/22 0520 06/12/22 0432  AST 48* 40 39  ALT 75* 56* 52*  ALKPHOS 270* 183* 148*  BILITOT 5.7* 4.3* 4.2*  PROT 8.5* 6.4* 6.1*  ALBUMIN 3.2* 2.4* 2.4*      Radiology Studies: ECHOCARDIOGRAM COMPLETE  Result Date: 06/10/2022    ECHOCARDIOGRAM REPORT   Patient Name:   Michelle Williams Date of Exam: 06/10/2022 Medical Rec #:  [id number]       Height:       60.0 in Accession #:    [id number]      Weight:       144.9 lb Date of Birth:  [birth year]       BSA:          1.628 m Patient Age:    69 years        BP:           124/74 mmHg Patient Gender: F               HR:           81 bpm. Exam Location:  Inpatient Procedure: 2D Echo, Cardiac Doppler and Color Doppler Indications:    Elevated Troponin  History:        Patient has prior history of Echocardiogram examinations, most                 recent 06/01/2022. Acute renal failure; Risk Factors:Hypertension                 and Current Smoker.  Sonographer:    Michelle Williams RVT RCS Referring Phys: [id number] [facility]  Sonographer Comments: Image  acquisition challenging due to respiratory motion. IMPRESSIONS  1. Left ventricular ejection fraction, by estimation, is 70 to 75%. The left ventricle has hyperdynamic function. The left ventricle has no regional wall motion abnormalities. There is mild left ventricular hypertrophy. Left ventricular diastolic parameters are consistent with Grade I diastolic dysfunction (impaired relaxation). LVOT gradient 9 mmHg  2. Right ventricular systolic function is normal. The right ventricular size is normal. There is normal pulmonary artery systolic pressure. The estimated right ventricular systolic pressure is 0000000 mmHg.  3. The mitral valve is normal in structure. Mild mitral valve regurgitation.  4. The aortic valve is tricuspid. Aortic valve regurgitation is not visualized. No aortic stenosis is present.  5. The inferior vena cava is normal in size with greater than 50% respiratory variability, suggesting right atrial pressure of 3 mmHg. FINDINGS  Left Ventricle: Left ventricular ejection fraction, by estimation, is 70 to 75%. The left ventricle has hyperdynamic function. The left ventricle has no regional wall motion abnormalities. The left ventricular internal cavity size was small. There is mild left ventricular hypertrophy. Left ventricular diastolic parameters are consistent with Grade I diastolic dysfunction (impaired relaxation). Right Ventricle: The right ventricular size is normal. No increase in right ventricular wall thickness. Right ventricular systolic function is normal. There is normal pulmonary artery systolic pressure. The tricuspid regurgitant velocity is 2.49 m/s, and  with an assumed right atrial pressure of 3 mmHg, the estimated right ventricular systolic pressure is 0000000 mmHg. Left Atrium: Left atrial size was normal in size. Right Atrium: Right atrial size was normal in size. Pericardium: There is no evidence of pericardial effusion. Mitral Valve: The mitral valve is normal in structure. Mild  mitral valve regurgitation. Tricuspid Valve: The tricuspid valve is normal in structure. Tricuspid valve regurgitation is trivial. Aortic Valve: The aortic valve is tricuspid. Aortic valve regurgitation is not visualized. No aortic stenosis is present. Aortic valve mean gradient measures 5.0 mmHg. Aortic valve peak gradient measures 11.4 mmHg. Aortic valve area, by VTI measures 4.54  cm. Pulmonic Valve: The pulmonic valve was not well visualized. Pulmonic valve regurgitation is not visualized. Aorta: The aortic root and ascending aorta are structurally normal, with no evidence of dilitation. Venous: The inferior vena cava is normal in size with greater than 50% respiratory variability, suggesting right atrial pressure of 3 mmHg. IAS/Shunts: The interatrial septum was not well visualized.  LEFT VENTRICLE PLAX 2D LVIDd:         3.00 cm   Diastology LVIDs:         2.20 cm   LV e' medial:    3.48 cm/s LV PW:         1.50 cm   LV E/e' medial:  23.2 LV IVS:        1.20 cm   LV e' lateral:   5.66 cm/s LVOT diam:     2.00 cm   LV E/e' lateral: 14.3 LV SV:         117 LV SV Index:   72 LVOT Area:     3.14 cm  RIGHT VENTRICLE             IVC RV Basal diam:  4.10 cm     IVC diam: 1.40 cm RV S prime:     25.60 cm/s TAPSE (M-mode): 1.9 cm LEFT ATRIUM             Index        RIGHT ATRIUM          Index LA diam:        3.00 cm 1.84 cm/m   RA Area:     9.87 cm LA Vol (A2C):   25.1 ml 15.42 ml/m  RA Volume:   20.70 ml 12.72 ml/m LA Vol (A4C):   31.7 ml 19.48 ml/m LA Biplane Vol: 28.2 ml 17.33 ml/m  AORTIC VALVE                     PULMONIC VALVE AV Area (Vmax):    3.90 cm      PV Vmax:       1.00 m/s AV Area (Vmean):   4.21 cm      PV Peak grad:  4.0 mmHg AV Area (VTI):     4.54 cm AV Vmax:           169.00 cm/s AV Vmean:          99.300 cm/s AV VTI:            0.258 m AV Peak Grad:      11.4 mmHg AV Mean Grad:      5.0 mmHg LVOT Vmax:         210.00 cm/s LVOT Vmean:        133.000 cm/s LVOT VTI:          0.373 m LVOT/AV  VTI ratio: 1.45  AORTA Ao Root diam: 3.00 cm Ao Asc diam:  3.40 cm Ao Arch diam: 2.2 cm MITRAL VALVE                TRICUSPID VALVE MV Area (PHT): 2.66 cm     TR Peak grad:   24.8 mmHg MV Decel Time: 285 msec     TR Vmax:        249.00 cm/s MV E velocity: 81.00 cm/s MV A velocity: 109.00 cm/s  SHUNTS MV E/A ratio:  0.74         Systemic VTI:  0.37 m                             Systemic Diam: 2.00 cm Oswaldo Milian MD Electronically signed by Oswaldo Milian MD Signature Date/Time: 06/10/2022/5:47:15 PM    Final       LOS: 2 days     Flora Lipps, MD [facility] Available via Epic secure chat 7am-7pm After these hours, please refer to coverage provider listed on [email or web address] 06/12/2022, 3:36 PM

## 2022-06-13 DIAGNOSIS — I10 Essential (primary) hypertension: Secondary | ICD-10-CM | POA: Diagnosis not present

## 2022-06-13 DIAGNOSIS — R933 Abnormal findings on diagnostic imaging of other parts of digestive tract: Secondary | ICD-10-CM | POA: Diagnosis not present

## 2022-06-13 DIAGNOSIS — K831 Obstruction of bile duct: Secondary | ICD-10-CM | POA: Diagnosis not present

## 2022-06-13 DIAGNOSIS — N179 Acute kidney failure, unspecified: Secondary | ICD-10-CM | POA: Diagnosis not present

## 2022-06-13 LAB — CBC
HCT: 23.9 % — ABNORMAL LOW (ref 36.0–46.0)
Hemoglobin: 8 g/dL — ABNORMAL LOW (ref 12.0–15.0)
MCH: 30.9 pg (ref 26.0–34.0)
MCHC: 33.5 g/dL (ref 30.0–36.0)
MCV: 92.3 fL (ref 80.0–100.0)
Platelets: 287 10*3/uL (ref 150–400)
RBC: 2.59 MIL/uL — ABNORMAL LOW (ref 3.87–5.11)
RDW: 13.7 % (ref 11.5–15.5)
WBC: 16.1 10*3/uL — ABNORMAL HIGH (ref 4.0–10.5)
nRBC: 0 % (ref 0.0–0.2)

## 2022-06-13 LAB — COMPREHENSIVE METABOLIC PANEL
ALT: 49 U/L — ABNORMAL HIGH (ref 0–44)
AST: 43 U/L — ABNORMAL HIGH (ref 15–41)
Albumin: 2.5 g/dL — ABNORMAL LOW (ref 3.5–5.0)
Alkaline Phosphatase: 151 U/L — ABNORMAL HIGH (ref 38–126)
Anion gap: 11 (ref 5–15)
BUN: 16 mg/dL (ref 6–20)
CO2: 28 mmol/L (ref 22–32)
Calcium: 7.2 mg/dL — ABNORMAL LOW (ref 8.9–10.3)
Chloride: 98 mmol/L (ref 98–111)
Creatinine, Ser: 0.66 mg/dL (ref 0.44–1.00)
GFR, Estimated: >60 mL/min (ref >60)
Glucose, Bld: 109 mg/dL — ABNORMAL HIGH (ref 70–99)
Potassium: 3.4 mmol/L — ABNORMAL LOW (ref 3.5–5.1)
Sodium: 137 mmol/L (ref 135–145)
Total Bilirubin: 3.8 mg/dL — ABNORMAL HIGH (ref 0.3–1.2)
Total Protein: 6.2 g/dL — ABNORMAL LOW (ref 6.5–8.1)

## 2022-06-13 LAB — MAGNESIUM: Magnesium: 1.2 mg/dL — ABNORMAL LOW (ref 1.7–2.4)

## 2022-06-13 MED ORDER — MAGNESIUM SULFATE 2 GM/50ML IV SOLN
2.0000 g | Freq: Once | INTRAVENOUS | Status: AC
  Administered 2022-06-13: 2 g via INTRAVENOUS
  Filled 2022-06-13: qty 50

## 2022-06-13 MED ORDER — MAGNESIUM OXIDE -MG SUPPLEMENT 400 (240 MG) MG PO TABS: 400.0000 mg | ORAL_TABLET | Freq: Every day | ORAL | 0 refills | Status: DC

## 2022-06-13 MED ORDER — MAGNESIUM OXIDE -MG SUPPLEMENT 400 (240 MG) MG PO TABS
400.0000 mg | ORAL_TABLET | Freq: Two times a day (BID) | ORAL | Status: DC
  Filled 2022-06-13: qty 1

## 2022-06-13 MED ORDER — POTASSIUM CHLORIDE 10 MEQ/100ML IV SOLN
10.0000 meq | INTRAVENOUS | Status: AC
  Administered 2022-06-13 (×4): 10 meq via INTRAVENOUS
  Filled 2022-06-13 (×4): qty 100

## 2022-06-13 MED ORDER — ACETAMINOPHEN 500 MG PO TABS
500.0000 mg | ORAL_TABLET | Freq: Once | ORAL | Status: AC
  Administered 2022-06-13: 500 mg via ORAL
  Filled 2022-06-13: qty 1

## 2022-06-13 MED ORDER — POTASSIUM CHLORIDE CRYS ER 20 MEQ PO TBCR: 20.0000 meq | EXTENDED_RELEASE_TABLET | Freq: Every day | ORAL | 0 refills | Status: DC

## 2022-06-13 NOTE — Discharge Summary (Addendum)
Physician Discharge Summary  [name] [id number] DOB: March 06, 1961 DOA: 06/10/2022  PCP: Cipriano Mile, NP  Admit date: 06/10/2022 Discharge date: 06/13/2022  Admitted From: Home  Discharge disposition: home   Recommendations for Outpatient Follow-Up:   Follow up with your primary care provider in one week. Check CBC, CMP, magnesium in the next visit Follow-up with oncology Dr. Burr Medico as outpatient for treatment planning of pancreatic cancer. Patient will benefit from a follow-up CT scan as outpatient for right upper quadrant collection.  Discharge Diagnosis:   Principal Problem:   Acute renal failure (HCC) Active Problems:   Obstructive jaundice   Abnormal magnetic resonance cholangiopancreatography (MRCP)   Essential hypertension   Pancreatic cancer ([place])   Discharge Condition: Improved.  Diet recommendation: Low sodium, heart healthy.    Wound care: None.  Code status: Full.   History of Present Illness:   Michelle Williams is a 62 y.o. female with medical history significant for pancreatic cancer who was discharged recently from the hospital  on 05/28/2022 presented to hospital with not doing well and unable to keep food and drink down with vomiting and failure to thrive.  Patient then went to cancer center and was noted to be hypotensive so she was sent into the ED.  In the ED, initial creatinine was 12.4 with elevated bilirubin level at 4.3 patient received several IV fluid boluses with improvement in the blood pressure.  Chest x-ray with low lung volumes without acute infiltrate.  CT scan of the abdomen and pelvis showed new diffuse infiltrative process in the right upper quadrant anterolateral to pancreas approximately 11 x 6.8 x 8.5 cm in size.  Patient was then considered for admission to the hospital for further evaluation and treatment.     Hospital Course:   Following conditions were addressed during hospitalization as listed below,  Acute kidney injury  likely secondary to volume depletion with metabolic acidosis.. Initial creatinine at 12.4.  Nephrology was consulted and patient was on  sodium bicarb drip.  Creatinine today has significantly improved and normalized at 0.6.  Patient received multiple IV fluid bolus on admission.  Baseline creatinine 0.7 .   Newly diagnosed pancreatic cancer stage I with obstructive jaundice.   Being followed up by Dr. Burr Medico oncology.  Will need to follow-up with oncology for treatment options once medically stable..  Bilirubin elevated at 3.8 status post ERCP with the internal/external biliary drain.  Bile draining well.   Hypotension secondary to volume depletion.  Has improved with IV fluids.    Mild hypokalemia.  Replenished prior to discharge.   large RUQ fluid collection  Was on antibiotic in the hospital but at this time no source of infection is obvious.  Sepsis has been ruled out.  Blood cultures negative in 3 days.  Urinalysis had some white cells but asymptomatic.  Narda Rutherford NP advised Dr. Kathlene Cote, interventional radiology who thinks it is an inflammatory phelgmon and does not require drainage at this time.  Clinically improved.  Plan is to repeat CT scan as outpatient.  .  Patient has clinically significantly improved.  WBC is still elevated and still has fever but patient is adamant about the going home and is threatening to leave [place].    Hypomagnesemia.   received IV magnesium sulfate prior to discharge.  Will continue magnesium oxide on discharge.   Acute hyponatremia. Improved after IV fluids.  Sodium prior to discharge was 137.   Lactic acidosis  Improved after IV fluids.  Latest lactic acid at 0.9.  Initial lactate elevated at 2.5.   Elevated troponin.  No chest pain or EKG changes.  Likely demand ischemia from patient's infection, acute kidney injury.   Disposition.  At this time, patient is stable for disposition home with outpatient follow-up with oncology,  interventional radiology.  Advised coming to the hospital/seeking medical attention for any worsening symptoms  Medical Consultants:   Interventional radiology  Oncology  Nephrology   Procedures:    None Subjective:   Today, patient patient was seen and examined at bedside.  Adamant about going home no matter what.  He however was willing to wait for magnesium and potassium supplements.  Feels better.  Denies any nausea vomiting or abdominal pain.  Was able to ambulate okay.  Discharge Exam:   Vitals:   06/12/22 2118 06/13/22 0524  BP: 121/84 133/71  Pulse: 82 89  Resp: 16 20  Temp: 99.3 F (37.4 C) (!) 101.3 F (38.5 C)  SpO2: 98% 96%   Vitals:   06/12/22 1306 06/12/22 2118 06/13/22 0500 06/13/22 0524  BP: 121/85 121/84  133/71  Pulse: 86 82  89  Resp:  16  20  Temp: 99.1 F (37.3 C) 99.3 F (37.4 C)  (!) 101.3 F (38.5 C)  TempSrc: Oral Oral  Oral  SpO2: 95% 98%  96%  Weight:   69.8 kg   Height:        General: Alert awake, not in obvious distress HENT: pupils equally reacting to light,  No scleral pallor or icterus noted. Oral mucosa is moist.  Chest:  Clear breath sounds.  Diminished breath sounds bilaterally. No crackles or wheezes.  CVS: S1 &S2 heard. No murmur.  Regular rate and rhythm. Abdomen: Soft, nontender, nondistended.  Bowel sounds are heard.  Right upper quadrant biliary tube in place. Extremities: No cyanosis, clubbing or edema.  Peripheral pulses are palpable. Psych: Alert, awake and oriented, normal mood CNS:  No cranial nerve deficits.  Power equal in all extremities.   Skin: Warm and dry.  No rashes noted.  The results of significant diagnostics from this hospitalization (including imaging, microbiology, ancillary and laboratory) are listed below for reference.     Diagnostic Studies:   ECHOCARDIOGRAM COMPLETE  Result Date: 06/10/2022    ECHOCARDIOGRAM REPORT   Patient Name:   [name] Date of Exam: 06/10/2022 Medical Rec #:   [id number]       Height:       60.0 in Accession #:    [id number]      Weight:       144.9 lb Date of Birth:  12-17-1960       BSA:          1.628 m Patient Age:    64 years        BP:           124/74 mmHg Patient Gender: F               HR:           81 bpm. Exam Location:  Inpatient Procedure: 2D Echo, Cardiac Doppler and Color Doppler Indications:    Elevated Troponin  History:        Patient has prior history of Echocardiogram examinations, most                 recent 06/01/2022. Acute renal failure; Risk Factors:Hypertension  and Current Smoker.  Sonographer:    Wilkie Aye RVT RCS Referring Phys: [id number] [facility]  Sonographer Comments: Image acquisition challenging due to respiratory motion. IMPRESSIONS  1. Left ventricular ejection fraction, by estimation, is 70 to 75%. The left ventricle has hyperdynamic function. The left ventricle has no regional wall motion abnormalities. There is mild left ventricular hypertrophy. Left ventricular diastolic parameters are consistent with Grade I diastolic dysfunction (impaired relaxation). LVOT gradient 9 mmHg  2. Right ventricular systolic function is normal. The right ventricular size is normal. There is normal pulmonary artery systolic pressure. The estimated right ventricular systolic pressure is 0000000 mmHg.  3. The mitral valve is normal in structure. Mild mitral valve regurgitation.  4. The aortic valve is tricuspid. Aortic valve regurgitation is not visualized. No aortic stenosis is present.  5. The inferior vena cava is normal in size with greater than 50% respiratory variability, suggesting right atrial pressure of 3 mmHg. FINDINGS  Left Ventricle: Left ventricular ejection fraction, by estimation, is 70 to 75%. The left ventricle has hyperdynamic function. The left ventricle has no regional wall motion abnormalities. The left ventricular internal cavity size was small. There is mild left ventricular hypertrophy. Left ventricular diastolic  parameters are consistent with Grade I diastolic dysfunction (impaired relaxation). Right Ventricle: The right ventricular size is normal. No increase in right ventricular wall thickness. Right ventricular systolic function is normal. There is normal pulmonary artery systolic pressure. The tricuspid regurgitant velocity is 2.49 m/s, and  with an assumed right atrial pressure of 3 mmHg, the estimated right ventricular systolic pressure is 0000000 mmHg. Left Atrium: Left atrial size was normal in size. Right Atrium: Right atrial size was normal in size. Pericardium: There is no evidence of pericardial effusion. Mitral Valve: The mitral valve is normal in structure. Mild mitral valve regurgitation. Tricuspid Valve: The tricuspid valve is normal in structure. Tricuspid valve regurgitation is trivial. Aortic Valve: The aortic valve is tricuspid. Aortic valve regurgitation is not visualized. No aortic stenosis is present. Aortic valve mean gradient measures 5.0 mmHg. Aortic valve peak gradient measures 11.4 mmHg. Aortic valve area, by VTI measures 4.54  cm. Pulmonic Valve: The pulmonic valve was not well visualized. Pulmonic valve regurgitation is not visualized. Aorta: The aortic root and ascending aorta are structurally normal, with no evidence of dilitation. Venous: The inferior vena cava is normal in size with greater than 50% respiratory variability, suggesting right atrial pressure of 3 mmHg. IAS/Shunts: The interatrial septum was not well visualized.  LEFT VENTRICLE PLAX 2D LVIDd:         3.00 cm   Diastology LVIDs:         2.20 cm   LV e' medial:    3.48 cm/s LV PW:         1.50 cm   LV E/e' medial:  23.2 LV IVS:        1.20 cm   LV e' lateral:   5.66 cm/s LVOT diam:     2.00 cm   LV E/e' lateral: 14.3 LV SV:         117 LV SV Index:   72 LVOT Area:     3.14 cm  RIGHT VENTRICLE             IVC RV Basal diam:  4.10 cm     IVC diam: 1.40 cm RV S prime:     25.60 cm/s TAPSE (M-mode): 1.9 cm LEFT ATRIUM  Index        RIGHT ATRIUM          Index LA diam:        3.00 cm 1.84 cm/m   RA Area:     9.87 cm LA Vol (A2C):   25.1 ml 15.42 ml/m  RA Volume:   20.70 ml 12.72 ml/m LA Vol (A4C):   31.7 ml 19.48 ml/m LA Biplane Vol: 28.2 ml 17.33 ml/m  AORTIC VALVE                     PULMONIC VALVE AV Area (Vmax):    3.90 cm      PV Vmax:       1.00 m/s AV Area (Vmean):   4.21 cm      PV Peak grad:  4.0 mmHg AV Area (VTI):     4.54 cm AV Vmax:           169.00 cm/s AV Vmean:          99.300 cm/s AV VTI:            0.258 m AV Peak Grad:      11.4 mmHg AV Mean Grad:      5.0 mmHg LVOT Vmax:         210.00 cm/s LVOT Vmean:        133.000 cm/s LVOT VTI:          0.373 m LVOT/AV VTI ratio: 1.45  AORTA Ao Root diam: 3.00 cm Ao Asc diam:  3.40 cm Ao Arch diam: 2.2 cm MITRAL VALVE                TRICUSPID VALVE MV Area (PHT): 2.66 cm     TR Peak grad:   24.8 mmHg MV Decel Time: 285 msec     TR Vmax:        249.00 cm/s MV E velocity: 81.00 cm/s MV A velocity: 109.00 cm/s  SHUNTS MV E/A ratio:  0.74         Systemic VTI:  0.37 m                             Systemic Diam: 2.00 cm Oswaldo Milian MD Electronically signed by Oswaldo Milian MD Signature Date/Time: 06/10/2022/5:47:15 PM    Final    CT ABDOMEN PELVIS WO CONTRAST  Result Date: 06/10/2022 CLINICAL DATA:  Acute abdominal pain, pancreatic mass, biliary drain, new renal failure, weakness, nausea, vomiting EXAM: CT ABDOMEN AND PELVIS WITHOUT CONTRAST TECHNIQUE: Multidetector CT imaging of the abdomen and pelvis was performed following the standard protocol without IV contrast. RADIATION DOSE REDUCTION: This exam was performed according to the departmental dose-optimization program which includes automated exposure control, adjustment of the mA and/or kV according to patient size and/or use of iterative reconstruction technique. COMPARISON:  Of 2024 FINDINGS: Lower chest: Dependent bibasilar atelectasis and lower lobe bronchiectasis Hepatobiliary: Percutaneous  biliary drain traverses RIGHT lobe liver and CBD into duodenum, pigtail catheter at third portion. Gallbladder decompressed. No focal hepatic mass. Pancreas: Ill-defined pancreatic head. Remainder of pancreas unremarkable. Spleen: Normal appearance Adrenals/Urinary Tract: Adrenal glands, kidneys, ureters, and bladder normal appearance Stomach/Bowel: Increased stool in rectum. Scattered stool in remainder of colon. Stomach unremarkable. Infiltrative changes adjacent to duodenum. Remaining bowel loops unremarkable. Vascular/Lymphatic: Atherosclerotic calcification aorta and iliac arteries. Few pelvic phleboliths. Reproductive: Unremarkable uterus and adnexa Other: No free air or free fluid. New diffuse infiltrative process  identified within the RIGHT upper quadrant, posterior and medial to the hepatic flexure of the colon, anterolateral to the pancreas, approximately 11.0 x 6.8 cm in size, 8.5 cm length, ill-defined. Mild associated wall thickening of the hepatic flexure of the colon with anterolateral colonic displacement. Fat within this region on the previous CT was clear. Musculoskeletal: No acute osseous findings. IMPRESSION: New diffuse infiltrative process within the RIGHT upper quadrant, posterior and medial to the hepatic flexure of the colon, anterolateral to the pancreas, approximately 11.0 x 6.8 x 8.5 cm in size, with mild associated wall thickening of the hepatic flexure of the colon with anterolateral colonic displacement. Fat within this region on the previous exam was clear. Differential diagnosis includes hemorrhage, infection, or marked tumor progression, atypical for tumor progression due to the short interval since previous CT. Recommend correlation with hemoglobin and white count. Electronically Signed   By: Lavonia Dana M.D.   On: 06/10/2022 13:16   DG Chest Port 1 View  Result Date: 06/10/2022 CLINICAL DATA:  Questionable sepsis. EXAM: PORTABLE CHEST 1 VIEW COMPARISON:  02/19/2022. FINDINGS:  0933 hours. Low lung volumes accentuate the pulmonary vasculature and cardiomediastinal silhouette. No focal airspace opacity. Otherwise stable cardiac and mediastinal contours. No pleural effusion or pneumothorax. IMPRESSION: Low lung volumes without evidence of acute cardiopulmonary disease. Electronically Signed   By: Emmit Alexanders M.D.   On: 06/10/2022 09:38     Labs:   Basic Metabolic Panel: Recent Labs  Lab 06/10/22 0927 06/10/22 1121 06/11/22 0520 06/12/22 0432 06/13/22 0412  NA 124* 125* 132* 138 137  K 4.9 4.6 3.2* 3.5 3.4*  CL 76* 84* 89* 94* 98  CO2 16* 16* 30 31 28  $ GLUCOSE 135* 97 116* 119* 109*  BUN 167* 137* 113* 62* 16  CREATININE 14.38* 12.44* 4.49* 1.09* 0.66  CALCIUM 8.5* 7.2* 7.0* 7.3* 7.2*  MG  --   --  2.1 1.5* 1.2*  PHOS  --   --  6.6*  --   --    GFR Estimated Creatinine Clearance: 65.2 mL/min (by C-G formula based on SCr of 0.66 mg/dL). Liver Function Tests: Recent Labs  Lab 06/10/22 0927 06/11/22 0520 06/12/22 0432 06/13/22 0412  AST 48* 40 39 43*  ALT 75* 56* 52* 49*  ALKPHOS 270* 183* 148* 151*  BILITOT 5.7* 4.3* 4.2* 3.8*  PROT 8.5* 6.4* 6.1* 6.2*  ALBUMIN 3.2* 2.4* 2.4* 2.5*   No results for input(s): "LIPASE", "AMYLASE" in the last 168 hours. No results for input(s): "AMMONIA" in the last 168 hours. Coagulation profile Recent Labs  Lab 06/10/22 0927 06/11/22 0520  INR 1.4* 1.4*    CBC: Recent Labs  Lab 06/10/22 0927 06/11/22 0520 06/12/22 0432 06/13/22 0412  WBC 16.9* 11.6* 12.7* 16.1*  NEUTROABS 13.2*  --   --   --   HGB 12.1 8.8* 8.3* 8.0*  HCT 34.4* 24.7* 24.9* 23.9*  MCV 85.1 86.4 91.2 92.3  PLT 514* 352 328 287   Cardiac Enzymes: No results for input(s): "CKTOTAL", "CKMB", "CKMBINDEX", "TROPONINI" in the last 168 hours. BNP: Invalid input(s): "POCBNP" CBG: No results for input(s): "GLUCAP" in the last 168 hours. D-Dimer No results for input(s): "DDIMER" in the last 72 hours. Hgb A1c Recent Labs     06/11/22 0520  HGBA1C 5.6   Lipid Profile No results for input(s): "CHOL", "HDL", "LDLCALC", "TRIG", "CHOLHDL", "LDLDIRECT" in the last 72 hours. Thyroid function studies No results for input(s): "TSH", "T4TOTAL", "T3FREE", "THYROIDAB" in the last 72 hours.  Invalid input(s): "FREET3" Anemia work up No results for input(s): "VITAMINB12", "FOLATE", "FERRITIN", "TIBC", "IRON", "RETICCTPCT" in the last 72 hours. Microbiology Recent Results (from the past 240 hour(s))  Resp panel by RT-PCR (RSV, Flu A&B, Covid) Anterior Nasal Swab     Status: None   Collection Time: 06/10/22  9:27 AM   Specimen: Anterior Nasal Swab  Result Value Ref Range Status   SARS Coronavirus 2 by RT PCR NEGATIVE NEGATIVE Final    Comment: (NOTE) SARS-CoV-2 target nucleic acids are NOT DETECTED.  The SARS-CoV-2 RNA is generally detectable in upper respiratory specimens during the acute phase of infection. The lowest concentration of SARS-CoV-2 viral copies this assay can detect is 138 copies/mL. A negative result does not preclude SARS-Cov-2 infection and should not be used as the sole basis for treatment or other patient management decisions. A negative result may occur with  improper specimen collection/handling, submission of specimen other than nasopharyngeal swab, presence of viral mutation(s) within the areas targeted by this assay, and inadequate number of viral copies(<138 copies/mL). A negative result must be combined with clinical observations, patient history, and epidemiological information. The expected result is Negative.  Fact Sheet for Patients:  EntrepreneurPulse.com.au  Fact Sheet for Healthcare Providers:  IncredibleEmployment.be  This test is no t yet approved or cleared by the [place] FDA and  has been authorized for detection and/or diagnosis of SARS-CoV-2 by FDA under an Emergency Use Authorization (EUA). This EUA will remain  in effect  (meaning this test can be used) for the duration of the COVID-19 declaration under Section 564(b)(1) of the Act, 21 U.S.C.section 360bbb-3(b)(1), unless the authorization is terminated  or revoked sooner.       Influenza A by PCR NEGATIVE NEGATIVE Final   Influenza B by PCR NEGATIVE NEGATIVE Final    Comment: (NOTE) The Xpert Xpress SARS-CoV-2/FLU/RSV plus assay is intended as an aid in the diagnosis of influenza from Nasopharyngeal swab specimens and should not be used as a sole basis for treatment. Nasal washings and aspirates are unacceptable for Xpert Xpress SARS-CoV-2/FLU/RSV testing.  Fact Sheet for Patients: EntrepreneurPulse.com.au  Fact Sheet for Healthcare Providers: IncredibleEmployment.be  This test is not yet approved or cleared by the [place] FDA and has been authorized for detection and/or diagnosis of SARS-CoV-2 by FDA under an Emergency Use Authorization (EUA). This EUA will remain in effect (meaning this test can be used) for the duration of the COVID-19 declaration under Section 564(b)(1) of the Act, 21 U.S.C. section 360bbb-3(b)(1), unless the authorization is terminated or revoked.     Resp Syncytial Virus by PCR NEGATIVE NEGATIVE Final    Comment: (NOTE) Fact Sheet for Patients: EntrepreneurPulse.com.au  Fact Sheet for Healthcare Providers: IncredibleEmployment.be  This test is not yet approved or cleared by the [place] FDA and has been authorized for detection and/or diagnosis of SARS-CoV-2 by FDA under an Emergency Use Authorization (EUA). This EUA will remain in effect (meaning this test can be used) for the duration of the COVID-19 declaration under Section 564(b)(1) of the Act, 21 U.S.C. section 360bbb-3(b)(1), unless the authorization is terminated or revoked.  Performed at [facility], [place] [address], [place], [place] [address]   Blood  Culture (routine x 2)     Status: None (Preliminary result)   Collection Time: 06/10/22  9:40 AM   Specimen: BLOOD LEFT HAND  Result Value Ref Range Status   Specimen Description   Final    BLOOD LEFT HAND Performed at [facility]  [place] [address] [place], [place] [address]    Special Requests   Final    BOTTLES DRAWN AEROBIC AND ANAEROBIC Blood Culture results may not be optimal due to an inadequate volume of blood received in culture bottles Performed at [place] [address] [place], [place] [address]    Culture   Final    NO GROWTH 2 DAYS Performed at [place] [address], [place], [place] [address]    Report Status PENDING  Incomplete  Blood Culture (routine x 2)     Status: None (Preliminary result)   Collection Time: 06/10/22  9:40 AM   Specimen: BLOOD  Result Value Ref Range Status   Specimen Description   Final    BLOOD LEFT ANTECUBITAL Performed at [place] [address] [place], [place] [address]    Special Requests   Final    BOTTLES DRAWN AEROBIC AND ANAEROBIC Blood Culture adequate volume Performed at [place] [address], [place], [place] [address]    Culture   Final    NO GROWTH 2 DAYS Performed at [place] [address], [place], [place] [address]    Report Status PENDING  Incomplete     Discharge Instructions:   Discharge Instructions     Call MD for:  persistant nausea and vomiting   Complete by: As directed    Call MD for:  severe uncontrolled pain   Complete by: As directed    Call MD for:  temperature >100.4   Complete by: As directed    Diet - low sodium heart healthy   Complete by: As directed    Discharge instructions   Complete by: As directed    Follow-up with your primary care provider in 1 week.  Check blood work at that time.  Seek medical attention for worsening symptoms. Follow up with oncology Dr Burr Medico as  scheduled by the clinic.   Increase activity slowly   Complete by: As directed       Allergies as of 06/13/2022   No Known Allergies      Medication List     TAKE these medications    albuterol 108 (90 Base) MCG/ACT inhaler Commonly known as: VENTOLIN HFA Inhale 2 puffs into the lungs every 6 (six) hours as needed for wheezing or shortness of breath.   cyclobenzaprine 10 MG tablet Commonly known as: FLEXERIL Take 10 mg by mouth at bedtime as needed for muscle spasms.   fluticasone 50 MCG/ACT nasal spray Commonly known as: FLONASE Place 1 spray into both nostrils daily. What changed:  when to take this reasons to take this   levocetirizine 5 MG tablet Commonly known as: XYZAL Take 5 mg by mouth at bedtime as needed for allergies.   lisinopril 10 MG tablet Commonly known as: ZESTRIL Take 1 tablet (10 mg total) by mouth daily.   magnesium oxide 400 (240 Mg) MG tablet Commonly known as: MAG-OX Take 1 tablet (400 mg total) by mouth daily for 10 days.   montelukast 10 MG tablet Commonly known as: SINGULAIR Take 10 mg by mouth at bedtime.   ondansetron 4 MG tablet Commonly known as: Zofran Take 1 tablet (4 mg total) by mouth daily as needed for nausea or vomiting.   potassium chloride SA 20 MEQ tablet Commonly known as: KLOR-CON M Take 1 tablet (20 mEq total) by mouth daily for 5 days.   traZODone 100  MG tablet Commonly known as: DESYREL Take 100 mg by mouth at bedtime as needed for sleep.   Varenicline Tartrate (Starter) 0.5 MG X 11 & 1 MG X 42 Tbpk Commonly known as: Chantix Starting Month Pak Days 1 to 3: 0.5 mg once daily. Days 4 to 7: 0.5 mg twice daily. Maintenance (day 8 and later): 1 mg twice daily What changed:  how much to take how to take this when to take this additional instructions          Time coordinating discharge: 39 minutes  Signed:  Linnae Rasool  [facility] 06/13/2022, 11:46 AM

## 2022-06-13 NOTE — Progress Notes (Signed)
PT Cancellation Note  Patient Details Name: Michelle Williams MRN: [id number] DOB: [birth year]   Cancelled Treatment:    Reason Eval/Treat Not Completed:  Attempted PT eval-pt declined to participate with therapy at this tme. Explained to pt that I my not make it back before she discharges home-pt acknowledged that. Will check back later as schedule allows.    [facility]  Office: [phone]

## 2022-06-13 NOTE — Progress Notes (Signed)
Pt repeatedly asking to "just go home". Offered a AMA paper, pt declined, not wanting family to be upset, but continued to say "I just wanna go home now". Came into room, pt had disconnected her running IV. Pt refused last 1/3 of 4th potassium bag stating she just wanted to go home. Removed IV and went over discharge paperwork. Pt wheeled out by nurse tech in stable condition with all belongings.

## 2022-06-13 NOTE — TOC Transition Note (Signed)
Transition of Care [facility]) - CM/SW Discharge Note   Patient Details  Name: [name] MRN: [id number] Date of Birth: 1961/02/15  Transition of Care [facility]) CM/SW Contact:  Dessa Phi, RN Phone Number: 06/13/2022, 10:11 AM   Clinical Narrative: referral for HHRN/aide-spoke to patient about [facility] services & criteria for skilled [place] services-patient has no wound,understands when to take meds, has biliary drain that needs to open & close(not a skilled need);aid service only provided if skilled need present-current.y no skilled need-patient voiced understanding. No further CM needs.MD updated.      Final next level of care: Home/Self Care Barriers to Discharge: No Barriers Identified   Patient Goals and CMS Choice      Discharge Placement                         Discharge Plan and Services Additional resources added to the After Visit Summary for                                       Social Determinants of Health (SDOH) Interventions SDOH Screenings   Food Insecurity: No Food Insecurity (06/10/2022)  Housing: Low Risk  (06/10/2022)  Transportation Needs: No Transportation Needs (06/10/2022)  Utilities: Not At Risk (06/10/2022)  Tobacco Use: High Risk (06/10/2022)     Readmission Risk Interventions     No data to display

## 2022-06-15 DIAGNOSIS — C250 Malignant neoplasm of head of pancreas: Secondary | ICD-10-CM

## 2022-06-15 LAB — CULTURE, BLOOD (ROUTINE X 2)
Culture: NO GROWTH
Culture: NO GROWTH
Special Requests: ADEQUATE

## 2022-06-15 NOTE — Progress Notes (Signed)
I spoke with Michelle Williams.  She is feeling well after her discharge.  She is aware and agreeable for lab and f/u appt on Friday 2/16 at 0930.

## 2022-06-16 ENCOUNTER — Ambulatory Visit: Payer: 59 | Attending: Internal Medicine | Admitting: Pharmacist

## 2022-06-16 VITALS — BP 128/78 | HR 85

## 2022-06-16 DIAGNOSIS — I10 Essential (primary) hypertension: Secondary | ICD-10-CM | POA: Diagnosis not present

## 2022-06-16 NOTE — Progress Notes (Unsigned)
[facility] OFFICE PROGRESS NOTE  Cipriano Mile, NP [place] [address]  DIAGNOSIS: Newly diagnosed pancreatic cancer   Oncology History  Pancreatic cancer ([place])  05/17/2022 Imaging   US Abdomen Limited RUQ   IMPRESSION: 1. Cholelithiasis without secondary signs of acute cholecystitis. 2. Common bile duct is mildly prominent at 7.6 mm. Recommend correlation with LFTs. In the setting of abnormal LFTs, recommend correlation with MRI/MRCP.   05/20/2022 Imaging   US Abdomen Limited RUQ   IMPRESSION: 1. Cholelithiasis without additional changes to suggest acute cholecystitis. 2. Dilated common bile duct at 13 mm, increasing since previous study. Consider MRCP to exclude common duct stone or obstruction.     05/21/2022 Imaging   CT ABDOMEN PELVIS W CONTRAST   IMPRESSION: 1. Dilatation of the proximal common bile duct measures up to 1.3 cm. Abrupt termination the dilated common bile duct at the level of the head of pancreas. There is also main duct dilatation which abruptly terminates at the level of the head of pancreas, proximal to the ampulla. Imaging findings are concerning for underlying obstructing lesion within the head of pancreas. Further evaluation with contrast enhanced MRI/MRCP is advised. 2. Stones noted on recent gallbladder ultrasound are not CT visible. There is no gallbladder wall thickening or pericholecystic fluid identified. 3. 2 mm right upper lobe lung nodule. Nonspecific. See lung cancer screening CT from 04/15/2022 for follow-up recommendations. 4.  Aortic Atherosclerosis (ICD10-I70.0).   05/21/2022 Imaging   MR ABDOMEN MRCP WO CONTRAST   IMPRESSION: 1. Exam detail is diminished. Patient aborted exam prior to completion of study and prior to administration of intravenous contrast material. Additionally, there is motion artifact which further diminishes diagnostic assessment of the area of concern within the head of  pancreas. 2. Marked intrahepatic and common bile duct dilatation. Abrupt termination of the common bile duct within the head of pancreas is noted. There is diffuse main duct dilatation up to the level of the head of pancreas which measures 6 mm in maximum diameter. The dilated main duct terminates at the same level as the termination of the CBD. Here, there is a subtle signal abnormality measuring approximately 1.9 x 0.7 cm. Imaging findings are concerning for underlying neoplasm within the head of pancreas. Advise further evaluation with ERCP and endoscopic ultrasound.      05/23/2022 Procedure   ERCP  -Normal appearing major papilla -Unable to cannulate either duct   05/25/2022 Procedure   IR BILIARY DRAIN PLACEMENT WITH CHOLANGIOGRAM    05/27/2022 Procedure   IR INT EXT BILIARY DRAIN WITH CHOLANGIOGRAM    05/27/2022 Procedure   IR ENDOLUMINAL BX OF BILIARY TREE    05/27/2022 Cancer Staging   Staging form: Exocrine Pancreas, AJCC 8th Edition - Clinical stage from 05/27/2022: Stage IA (cT1c, cN0, cM0) - Signed by Truitt Merle, MD on 06/10/2022 Total positive nodes: 0   06/10/2022 Initial Diagnosis   Pancreatic cancer [facility])    Tumor Marker   Patient's tumor was tested for the following markers: CA 19.9. Results of the tumor marker test revealed 158.     CURRENT THERAPY: None  INTERVAL HISTORY: Michelle Williams 62 y.o. female returns to the clinic today to establish care on an outpatient basis with medical oncology.  The patient was hospitalized in 1/20-1/27/24 after presenting to the hospital with obstructive jaundice and workup revealed pancreatic cancer.  Status post ERCP with the internal/external biliary drain.   The patient then was scheduled to establish care with our  clinic on 06/10/2022.  Upon presenting to the clinic the patient had been endorsing nausea, vomiting, abdominal pain, and significant hypotension.  Due to her unstable vitals she was subsequently sent to the  emergency room.   In the emergency room her creatinine was significantly elevated at 12.4.  CT of the abdomen pelvis showed new diffuse infiltrative process in the right upper quadrant anterior lateral to the pancreas approximately 11 x 6.8 x 8.5 cm in size.  She was admitted until 04/13/2023.  The acute kidney injury was secondary to volume depletion with metabolic acidosis.  Her creatinine significantly improved and normalized to 0.6.  For the right upper quadrant fluid collection, she was on antibiotics in the hospital, although no source of infection was obvious.  Sepsis was ruled out and blood cultures were negative after 3 days.  Interventional radiology thinks that this was an inflammatory inflammatory phelgmon and does not require drainage at this time.  The plan is to repeat CT scan as outpatient.   Since being discharged the patient feels significantly better.  She feels back to her baseline.  She denies any fever, chills, dysphagia, odynophagia, abdominal pain, nausea, vomiting, diarrhea, or constipation.  She denies any significant weight loss prior to her diagnosis. Her stool is normal color and consistency at her time, however, she initially presented to her PCP for work up in December 2023 due to "orange" urine and stool.  Her urine has returned to yellow at this time. She still is having lighter/tan stools. She denies any significant dizziness except she felt slightly lightheaded after having blood drawn today.  Denies any significant itching except she sometimes may have itching in her hands or feet.  Denies any chest pain, shortness of breath, cough, or hemoptysis.  For her biliary drain, she drained this twice yesterday.  She does mention she needs some supplies for dressing changes around her biliary drain.   Socially, she does not have any family that lives nearby but she is accompanied by her friend today who helps her.  The patient is independent at home and lives in an apartment.  She  is a former smoker having smoked approximately 50 years averaging less than 1 pack of cigarettes per day.  She quit smoking prior to her hospitalization.  She denies any alcohol or drug use. Her sister lives in [place]. She has another sister and brother.   the patient is on disability due to degenerative disc disease. She has insurance.  She denies any family history of cancer except her brother has colorectal cancer which was diagnosed when he was in his mid 53's.  The patient has a personal history of cervical cancer that was frozen when she was 8 or 62 years old.    MEDICAL HISTORY: Past Medical History:  Diagnosis Date   Acute bronchitis    Allergy    Anemia    Anxiety    Arthritis    Cataract    BEGINIING   Cervical cancer ([place])    Degenerative joint disease    UPPER,LOWER BACK   Gallstones    H/O degenerative disc disease    H/O rheumatoid arthritis    Hypertension    Neuropathy    BILATERAL    ALLERGIES:  has No Known Allergies.  MEDICATIONS:  Current Outpatient Medications  Medication Sig Dispense Refill   albuterol (VENTOLIN HFA) 108 (90 Base) MCG/ACT inhaler Inhale 2 puffs into the lungs every 6 (six) hours as needed for wheezing or shortness of breath.  cyclobenzaprine (FLEXERIL) 10 MG tablet Take 10 mg by mouth at bedtime as needed for muscle spasms.  0   fluticasone (FLONASE) 50 MCG/ACT nasal spray Place 1 spray into both nostrils daily. (Patient taking differently: Place 1 spray into both nostrils daily as needed for allergies.) 9.9 mL 2   levocetirizine (XYZAL) 5 MG tablet Take 5 mg by mouth at bedtime as needed for allergies.     montelukast (SINGULAIR) 10 MG tablet Take 10 mg by mouth at bedtime.     ondansetron (ZOFRAN) 8 MG tablet Take 1 tablet (8 mg total) by mouth every 8 (eight) hours as needed for nausea or vomiting. 30 tablet 2   potassium chloride SA (KLOR-CON M) 20 MEQ tablet Take 1 tablet (20 mEq total) by mouth daily for 5 days. 5 tablet 0    traZODone (DESYREL) 100 MG tablet Take 100 mg by mouth at bedtime as needed for sleep.     Varenicline Tartrate, Starter, (CHANTIX STARTING MONTH PAK) 0.5 MG X 11 & 1 MG X 42 TBPK Days 1 to 3: 0.5 mg once daily. Days 4 to 7: 0.5 mg twice daily. Maintenance (day 8 and later): 1 mg twice daily 1 each 0   magnesium oxide (MAG-OX) 400 (240 Mg) MG tablet Take 1 tablet (400 mg total) by mouth 2 (two) times daily. 90 tablet 1   No current facility-administered medications for this visit.    SURGICAL HISTORY:  Past Surgical History:  Procedure Laterality Date   BREAST EXCISIONAL BIOPSY Right    COLONOSCOPY     2018   ERCP N/A 05/23/2022   Procedure: ENDOSCOPIC RETROGRADE CHOLANGIOPANCREATOGRAPHY (ERCP);  Surgeon: Ladene Artist, MD;  Location: [place];  Service: Gastroenterology;  Laterality: N/A;   IR BILIARY DRAIN PLACEMENT WITH CHOLANGIOGRAM  05/25/2022   IR ENDOLUMINAL BX OF BILIARY TREE  05/30/2022   IR FALLOPIAN TUBE CATHETERIZATION     IR INT EXT BILIARY DRAIN WITH CHOLANGIOGRAM  05/27/2022   POLYPECTOMY      REVIEW OF SYSTEMS:   Review of Systems  Constitutional: Negative for appetite change, chills, fatigue, fever and unexpected weight change.  HENT: Negative for mouth sores, nosebleeds, sore throat and trouble swallowing.   Eyes: Negative for eye problems. Positive for some scleral icterus Respiratory: Negative for cough, hemoptysis, shortness of breath and wheezing.   Cardiovascular: Negative for chest pain and leg swelling.  Gastrointestinal: Negative for abdominal pain, constipation, diarrhea, nausea and vomiting.  Genitourinary: Negative for bladder incontinence, difficulty urinating, dysuria, frequency and hematuria.   Musculoskeletal: Negative for back pain, gait problem, neck pain and neck stiffness.  Skin: Negative for itching and rash.  Neurological: Negative for dizziness, extremity weakness, gait problem, headaches, light-headedness and seizures.  Hematological:  Negative for adenopathy. Does not bruise/bleed easily.  Psychiatric/Behavioral: Negative for confusion, depression and sleep disturbance. The patient is not nervous/anxious.     PHYSICAL EXAMINATION:  Blood pressure (!) 136/96, pulse 87, temperature 97.8 F (36.6 C), temperature source Oral, resp. rate 16, weight 150 lb 11.2 oz (68.4 kg), SpO2 100 %.  ECOG PERFORMANCE STATUS: 1  Physical Exam  Constitutional: Oriented to person, place, and time and well-developed, well-nourished, and in no distress.  HENT:  Head: Normocephalic and atraumatic.  Mouth/Throat: Oropharynx is clear and moist. No oropharyngeal exudate.  Eyes: Positive for yellowing of sclera. Conjunctivae are normal. Right eye exhibits no discharge. Left eye exhibits no discharge.  Neck: Normal range of motion. Neck supple.  Cardiovascular: Normal rate, regular rhythm, normal  heart sounds and intact distal pulses.   Pulmonary/Chest: Effort normal and breath sounds normal. No respiratory distress. No wheezes. No rales.  Abdominal: Soft. Bowel sounds are normal. Exhibits no distension and no mass. There is no tenderness. Biliary drain in place.  Musculoskeletal: Normal range of motion. Exhibits no edema.  Lymphadenopathy:    No cervical adenopathy.  Neurological: Alert and oriented to person, place, and time. Exhibits normal muscle tone. Gait normal. Coordination normal.  Skin: Skin is warm and dry. No rash noted. Not diaphoretic. No erythema. No pallor.  Psychiatric: Mood, memory and judgment normal.  Vitals reviewed.  LABORATORY DATA: Lab Results  Component Value Date   WBC 9.4 06/17/2022   HGB 8.5 (L) 06/17/2022   HCT 24.9 (L) 06/17/2022   MCV 92.9 06/17/2022   PLT 501 (H) 06/17/2022      Chemistry      Component Value Date/Time   NA 136 06/17/2022 0904   NA 139 03/13/2014 1140   K 4.0 06/17/2022 0904   K 4.2 03/13/2014 1140   CL 107 06/17/2022 0904   CL 109 (H) 03/13/2014 1140   CO2 22 06/17/2022 0904    CO2 22 03/13/2014 1140   BUN 7 06/17/2022 0904   BUN 7 03/13/2014 1140   CREATININE 0.43 (L) 06/17/2022 0904   CREATININE 0.82 03/13/2014 1140      Component Value Date/Time   CALCIUM 7.8 (L) 06/17/2022 0904   CALCIUM 8.5 03/13/2014 1140   ALKPHOS 274 (H) 06/17/2022 0904   AST 38 06/17/2022 0904   ALT 47 (H) 06/17/2022 0904   BILITOT 3.1 (H) 06/17/2022 0904       RADIOGRAPHIC STUDIES:  ECHOCARDIOGRAM COMPLETE  Result Date: 06/10/2022    ECHOCARDIOGRAM REPORT   Patient Name:   Michelle Williams Date of Exam: 06/10/2022 Medical Rec #:  [id number]       Height:       60.0 in Accession #:    [id number]      Weight:       144.9 lb Date of Birth:  1960/12/12       BSA:          1.628 m Patient Age:    19 years        BP:           124/74 mmHg Patient Gender: F               HR:           81 bpm. Exam Location:  Inpatient Procedure: 2D Echo, Cardiac Doppler and Color Doppler Indications:    Elevated Troponin  History:        Patient has prior history of Echocardiogram examinations, most                 recent 06/01/2022. Acute renal failure; Risk Factors:Hypertension                 and Current Smoker.  Sonographer:    Wilkie Aye RVT RCS Referring Phys: [id number] [facility]  Sonographer Comments: Image acquisition challenging due to respiratory motion. IMPRESSIONS  1. Left ventricular ejection fraction, by estimation, is 70 to 75%. The left ventricle has hyperdynamic function. The left ventricle has no regional wall motion abnormalities. There is mild left ventricular hypertrophy. Left ventricular diastolic parameters are consistent with Grade I diastolic dysfunction (impaired relaxation). LVOT gradient 9 mmHg  2. Right ventricular systolic function is normal. The right ventricular size is normal. There is normal  pulmonary artery systolic pressure. The estimated right ventricular systolic pressure is 0000000 mmHg.  3. The mitral valve is normal in structure. Mild mitral valve regurgitation.  4. The aortic valve  is tricuspid. Aortic valve regurgitation is not visualized. No aortic stenosis is present.  5. The inferior vena cava is normal in size with greater than 50% respiratory variability, suggesting right atrial pressure of 3 mmHg. FINDINGS  Left Ventricle: Left ventricular ejection fraction, by estimation, is 70 to 75%. The left ventricle has hyperdynamic function. The left ventricle has no regional wall motion abnormalities. The left ventricular internal cavity size was small. There is mild left ventricular hypertrophy. Left ventricular diastolic parameters are consistent with Grade I diastolic dysfunction (impaired relaxation). Right Ventricle: The right ventricular size is normal. No increase in right ventricular wall thickness. Right ventricular systolic function is normal. There is normal pulmonary artery systolic pressure. The tricuspid regurgitant velocity is 2.49 m/s, and  with an assumed right atrial pressure of 3 mmHg, the estimated right ventricular systolic pressure is 0000000 mmHg. Left Atrium: Left atrial size was normal in size. Right Atrium: Right atrial size was normal in size. Pericardium: There is no evidence of pericardial effusion. Mitral Valve: The mitral valve is normal in structure. Mild mitral valve regurgitation. Tricuspid Valve: The tricuspid valve is normal in structure. Tricuspid valve regurgitation is trivial. Aortic Valve: The aortic valve is tricuspid. Aortic valve regurgitation is not visualized. No aortic stenosis is present. Aortic valve mean gradient measures 5.0 mmHg. Aortic valve peak gradient measures 11.4 mmHg. Aortic valve area, by VTI measures 4.54  cm. Pulmonic Valve: The pulmonic valve was not well visualized. Pulmonic valve regurgitation is not visualized. Aorta: The aortic root and ascending aorta are structurally normal, with no evidence of dilitation. Venous: The inferior vena cava is normal in size with greater than 50% respiratory variability, suggesting right atrial  pressure of 3 mmHg. IAS/Shunts: The interatrial septum was not well visualized.  LEFT VENTRICLE PLAX 2D LVIDd:         3.00 cm   Diastology LVIDs:         2.20 cm   LV e' medial:    3.48 cm/s LV PW:         1.50 cm   LV E/e' medial:  23.2 LV IVS:        1.20 cm   LV e' lateral:   5.66 cm/s LVOT diam:     2.00 cm   LV E/e' lateral: 14.3 LV SV:         117 LV SV Index:   72 LVOT Area:     3.14 cm  RIGHT VENTRICLE             IVC RV Basal diam:  4.10 cm     IVC diam: 1.40 cm RV S prime:     25.60 cm/s TAPSE (M-mode): 1.9 cm LEFT ATRIUM             Index        RIGHT ATRIUM          Index LA diam:        3.00 cm 1.84 cm/m   RA Area:     9.87 cm LA Vol (A2C):   25.1 ml 15.42 ml/m  RA Volume:   20.70 ml 12.72 ml/m LA Vol (A4C):   31.7 ml 19.48 ml/m LA Biplane Vol: 28.2 ml 17.33 ml/m  AORTIC VALVE  PULMONIC VALVE AV Area (Vmax):    3.90 cm      PV Vmax:       1.00 m/s AV Area (Vmean):   4.21 cm      PV Peak grad:  4.0 mmHg AV Area (VTI):     4.54 cm AV Vmax:           169.00 cm/s AV Vmean:          99.300 cm/s AV VTI:            0.258 m AV Peak Grad:      11.4 mmHg AV Mean Grad:      5.0 mmHg LVOT Vmax:         210.00 cm/s LVOT Vmean:        133.000 cm/s LVOT VTI:          0.373 m LVOT/AV VTI ratio: 1.45  AORTA Ao Root diam: 3.00 cm Ao Asc diam:  3.40 cm Ao Arch diam: 2.2 cm MITRAL VALVE                TRICUSPID VALVE MV Area (PHT): 2.66 cm     TR Peak grad:   24.8 mmHg MV Decel Time: 285 msec     TR Vmax:        249.00 cm/s MV E velocity: 81.00 cm/s MV A velocity: 109.00 cm/s  SHUNTS MV E/A ratio:  0.74         Systemic VTI:  0.37 m                             Systemic Diam: 2.00 cm Oswaldo Milian MD Electronically signed by Oswaldo Milian MD Signature Date/Time: 06/10/2022/5:47:15 PM    Final    CT ABDOMEN PELVIS WO CONTRAST  Result Date: 06/10/2022 CLINICAL DATA:  Acute abdominal pain, pancreatic mass, biliary drain, new renal failure, weakness, nausea, vomiting EXAM: CT  ABDOMEN AND PELVIS WITHOUT CONTRAST TECHNIQUE: Multidetector CT imaging of the abdomen and pelvis was performed following the standard protocol without IV contrast. RADIATION DOSE REDUCTION: This exam was performed according to the departmental dose-optimization program which includes automated exposure control, adjustment of the mA and/or kV according to patient size and/or use of iterative reconstruction technique. COMPARISON:  Of 2024 FINDINGS: Lower chest: Dependent bibasilar atelectasis and lower lobe bronchiectasis Hepatobiliary: Percutaneous biliary drain traverses RIGHT lobe liver and CBD into duodenum, pigtail catheter at third portion. Gallbladder decompressed. No focal hepatic mass. Pancreas: Ill-defined pancreatic head. Remainder of pancreas unremarkable. Spleen: Normal appearance Adrenals/Urinary Tract: Adrenal glands, kidneys, ureters, and bladder normal appearance Stomach/Bowel: Increased stool in rectum. Scattered stool in remainder of colon. Stomach unremarkable. Infiltrative changes adjacent to duodenum. Remaining bowel loops unremarkable. Vascular/Lymphatic: Atherosclerotic calcification aorta and iliac arteries. Few pelvic phleboliths. Reproductive: Unremarkable uterus and adnexa Other: No free air or free fluid. New diffuse infiltrative process identified within the RIGHT upper quadrant, posterior and medial to the hepatic flexure of the colon, anterolateral to the pancreas, approximately 11.0 x 6.8 cm in size, 8.5 cm length, ill-defined. Mild associated wall thickening of the hepatic flexure of the colon with anterolateral colonic displacement. Fat within this region on the previous CT was clear. Musculoskeletal: No acute osseous findings. IMPRESSION: New diffuse infiltrative process within the RIGHT upper quadrant, posterior and medial to the hepatic flexure of the colon, anterolateral to the pancreas, approximately 11.0 x 6.8 x 8.5 cm in size, with mild associated wall thickening of  the  hepatic flexure of the colon with anterolateral colonic displacement. Fat within this region on the previous exam was clear. Differential diagnosis includes hemorrhage, infection, or marked tumor progression, atypical for tumor progression due to the short interval since previous CT. Recommend correlation with hemoglobin and white count. Electronically Signed   By: Lavonia Dana M.D.   On: 06/10/2022 13:16   DG Chest Port 1 View  Result Date: 06/10/2022 CLINICAL DATA:  Questionable sepsis. EXAM: PORTABLE CHEST 1 VIEW COMPARISON:  02/19/2022. FINDINGS: 0933 hours. Low lung volumes accentuate the pulmonary vasculature and cardiomediastinal silhouette. No focal airspace opacity. Otherwise stable cardiac and mediastinal contours. No pleural effusion or pneumothorax. IMPRESSION: Low lung volumes without evidence of acute cardiopulmonary disease. Electronically Signed   By: Emmit Alexanders M.D.   On: 06/10/2022 09:38   ECHOCARDIOGRAM COMPLETE  Result Date: 06/01/2022    ECHOCARDIOGRAM REPORT   Patient Name:   Michelle Williams Date of Exam: 06/01/2022 Medical Rec #:  [id number]       Height:       64.0 in Accession #:    [id number]      Weight:       162.4 lb Date of Birth:  25-May-1960       BSA:          1.791 m Patient Age:    62 years        BP:           142/86 mmHg Patient Gender: F               HR:           88 bpm. Exam Location:  [address] Procedure: 2D Echo, Cardiac Doppler and Color Doppler Indications:    R94.31 Abnormal EKG  History:        Patient has no prior history of Echocardiogram examinations.                 Abnormal ECG, Signs/Symptoms:Shortness of Breath; Risk                 Factors:Hypertension and Current Smoker.  Sonographer:    Coralyn Helling RDCS Referring Phys: [place]  Sonographer Comments: Image acquisition challenging due to respiratory motion. IMPRESSIONS  1. LVOT mean gradient 9 mmHg. Left ventricular ejection fraction, by estimation, is 70 to 75%. The left  ventricle has hyperdynamic function. The left ventricle has no regional wall motion abnormalities. There is mild left ventricular hypertrophy. Left ventricular diastolic parameters are consistent with Grade I diastolic dysfunction (impaired relaxation).  2. Right ventricular systolic function is normal. The right ventricular size is normal.  3. SAM (systolic anterior motion of the mitral valve). The mitral valve is myxomatous. No evidence of mitral valve regurgitation. No evidence of mitral stenosis.  4. The aortic valve is tricuspid. Aortic valve regurgitation is not visualized. No aortic stenosis is present.  5. The inferior vena cava is normal in size with greater than 50% respiratory variability, suggesting right atrial pressure of 3 mmHg. FINDINGS  Left Ventricle: LVOT mean gradient 9 mmHg. Left ventricular ejection fraction, by estimation, is 70 to 75%. The left ventricle has hyperdynamic function. The left ventricle has no regional wall motion abnormalities. The left ventricular internal cavity size was normal in size. There is mild left ventricular hypertrophy. Left ventricular diastolic parameters are consistent with Grade I diastolic dysfunction (impaired relaxation). Right Ventricle: The right ventricular size is normal. No increase in right ventricular wall thickness.  Right ventricular systolic function is normal. Left Atrium: Left atrial size was normal in size. Right Atrium: Right atrial size was normal in size. Pericardium: There is no evidence of pericardial effusion. Mitral Valve: SAM (systolic anterior motion of the mitral valve). The mitral valve is myxomatous. There is mild thickening of the mitral valve leaflet(s). No evidence of mitral valve regurgitation. No evidence of mitral valve stenosis. Tricuspid Valve: The tricuspid valve is normal in structure. Tricuspid valve regurgitation is mild . No evidence of tricuspid stenosis. Aortic Valve: The aortic valve is tricuspid. Aortic valve  regurgitation is not visualized. No aortic stenosis is present. Aortic valve mean gradient measures 9.0 mmHg. Aortic valve peak gradient measures 17.7 mmHg. Aortic valve area, by VTI measures 2.49  cm. Pulmonic Valve: The pulmonic valve was normal in structure. Pulmonic valve regurgitation is not visualized. No evidence of pulmonic stenosis. Aorta: The aortic root is normal in size and structure. Venous: The inferior vena cava is normal in size with greater than 50% respiratory variability, suggesting right atrial pressure of 3 mmHg. IAS/Shunts: No atrial level shunt detected by color flow Doppler.  LEFT VENTRICLE PLAX 2D LVIDd:         3.50 cm   Diastology LVIDs:         2.30 cm   LV e' medial:    8.38 cm/s LV PW:         1.30 cm   LV E/e' medial:  7.1 LV IVS:        1.30 cm   LV e' lateral:   9.90 cm/s LVOT diam:     2.00 cm   LV E/e' lateral: 6.0 LV SV:         67 LV SV Index:   37 LVOT Area:     3.14 cm  RIGHT VENTRICLE             IVC RV S prime:     24.10 cm/s  IVC diam: 1.20 cm TAPSE (M-mode): 1.7 cm RVSP:           25.8 mmHg LEFT ATRIUM             Index        RIGHT ATRIUM           Index LA diam:        3.10 cm 1.73 cm/m   RA Pressure: 3.00 mmHg LA Vol (A2C):   34.3 ml 19.15 ml/m  RA Area:     8.55 cm LA Vol (A4C):   38.8 ml 21.67 ml/m  RA Volume:   16.10 ml  8.99 ml/m LA Biplane Vol: 36.5 ml 20.38 ml/m  AORTIC VALVE AV Area (Vmax):    2.37 cm AV Area (Vmean):   2.30 cm AV Area (VTI):     2.49 cm AV Vmax:           210.50 cm/s AV Vmean:          138.000 cm/s AV VTI:            0.269 m AV Peak Grad:      17.7 mmHg AV Mean Grad:      9.0 mmHg LVOT Vmax:         159.00 cm/s LVOT Vmean:        101.000 cm/s LVOT VTI:          0.213 m LVOT/AV VTI ratio: 0.79  AORTA Ao Root diam: 3.10 cm Ao Asc diam:  3.20 cm MITRAL VALVE  TRICUSPID VALVE MV Area (PHT): 2.94 cm    TR Peak grad:   22.8 mmHg MV Decel Time: 258 msec    TR Vmax:        239.00 cm/s MV E velocity: 59.20 cm/s  Estimated RAP:   3.00 mmHg MV A velocity: 85.90 cm/s  RVSP:           25.8 mmHg MV E/A ratio:  0.69                            SHUNTS                            Systemic VTI:  0.21 m                            Systemic Diam: 2.00 cm Candee Furbish MD Electronically signed by Candee Furbish MD Signature Date/Time: 06/01/2022/3:28:32 PM    Final    IR INT EXT BILIARY DRAIN WITH CHOLANGIOGRAM  Result Date: 05/27/2022 INDICATION: Biliary obstruction, Status post percutaneous biliary drain catheter placement 05/25/2022. Internalization with breast biopsy requested. EXAM: EXCHANGE OF BILIARY DRAIN FOR INTERNAL/EXTERNAL CATHETER BRUSH BIOPSY UNDER FLUOROSCOPY MEDICATIONS: Rocephin 2 g IV; the antibiotic was administered within an appropriate time frame prior to the initiation of the procedure. Patient received 10 mg hydralazine IV during the procedure due to hypertension ANESTHESIA/SEDATION: Intravenous Fentanyl 154mg and Versed 243mwere administered as conscious sedation during continuous monitoring of the patient's level of consciousness and physiological / cardiorespiratory status by the radiology RN, with a total moderate sedation time of 18 minutes. FLUOROSCOPY: Radiation Exposure Index (as provided by the fluoroscopic device): 13 mGy Kerma COMPLICATIONS: None immediate. PROCEDURE: Informed written consent was obtained from the patient after a thorough discussion of the procedural risks, benefits and alternatives. All questions were addressed. Maximal Sterile Barrier Technique was utilized including caps, mask, sterile gowns, sterile gloves, sterile drape, hand hygiene and skin antiseptic. A timeout was performed prior to the initiation of the procedure. Contrast injection through the existing catheter demonstrates good position within the biliary tree. No contrast passage into the duodenum. Catheter was then cut and exchanged over 035 wire for a 5 Fr[place] catheter, negotiated across the distal CBD obstruction into the distal  duodenum. Catheter exchanged over Amplatz wire for a 5 French vascular sheath. Brush biopsy x2 of the distal CBD obstruction region was performed, and submitted to cytopathology. The angiographic catheter was then exchanged for a new 10 French multi sidehole internal/external biliary drainage catheter, positioned with the distal end locked in the distal duodenum, proximal side holes spanning the CBD obstruction. Catheter injection shows catheter patency, with good positioning, and decompression of the biliary tree. Catheter secured externally with 0 Prolene suture and placed to external drainage bag for now. The patient tolerated the procedure well. IMPRESSION: 1. Successful exchange of percutaneous biliary drain to an internal/external biliary drain. 2. Brush biopsy of distal CBD obstruction. Electronically Signed   By: D Lucrezia Europe.D.   On: 05/27/2022 15:49   IR BILIARY DRAIN PLACEMENT WITH CHOLANGIOGRAM  Result Date: 05/25/2022 INDICATION: [age]ear old female referred for PTC and drainage EXAM: IMAGE GUIDED PERCUTANEOUS TRANS HEPATIC CHOLANGIOGRAM AND EXTERNAL DRAINAGE MEDICATIONS: 2 g cefoxitin; The antibiotic was administered within an appropriate time frame prior to the initiation of the procedure. 4 mg Zofran, 25 mg IV Demerol ANESTHESIA/SEDATION: Moderate (conscious) sedation  was employed during this procedure. A total of Versed 1.5 mg and Fentanyl 75 mcg was administered intravenously by the radiology nurse. Total intra-service moderate Sedation Time: 35 minutes. The patient's level of consciousness and vital signs were monitored continuously by radiology nursing throughout the procedure under my direct supervision. FLUOROSCOPY: Radiation Exposure Index (as provided by the fluoroscopic device): 57 mGy Kerma COMPLICATIONS: SIR LEVEL B - Normal therapy, includes overnight admission for observation. Rigors PROCEDURE: The procedure, risks, benefits, and alternatives were explained to the patient and the  patient's family. A complete informed consent was performed, with risk benefit analysis. Specific risks that were discussed for the procedure include bleeding, infection, biliary sepsis, IC use day, organ injury, need for further procedure, need for further surgery, long-term drain placement, cardiopulmonary collapse, death. Questions regarding the procedure were encouraged and answered. The patient understands and consents to the procedure. Patient is position in supine position on the fluoroscopy table, and the upper abdomen was prepped and draped in the usual sterile fashion. Maximum barrier sterile technique with sterile gowns and gloves were used for the procedure. A timeout was performed prior to the initiation of the procedure. Local anesthesia was provided with 1% lidocaine with epinephrine. Ultrasound survey of the right liver lobe was performed. Window into the right biliary system was only present for the right liver approach. 1% lidocaine was used for local anesthesia, with generous infiltration of the skin and subcutaneous tissues in and inter left costal location. A Chiba needle was advanced under ultrasound guidance into the right liver lobe. Once the tip of the needle was confirmed within the biliary system by injecting small aliquots of contrast, images were stored of the biliary system after partially opacifying the biliary tree via the needle. 018 wire was advanced centrally. The needle was removed, a small incision was made with an 11 blade scalpel, and then a triaxial Accustick system was advanced into the biliary system. The metal stiffener and dilator were removed, we confirmed placement with contrast infusion. Attempt was made to cross the obstruction of the common bile duct. Combination of standard stiff Glidewire, roadrunner wire, with coaxial 4 French glide cath were used. Unsuccessful in navigating the occlusion. The patient was beginning to experience rigors, and so we decided to  withdraw at this time. 035 guidewire was advanced and the Accustick was removed. Dilation of the subcutaneous tissue tracks was performed with an 10 [place] dilator, and then a 10 [place] multipurpose drain was placed as an externalized biliary drain. Small amount of contrast confirmed location. Drain was sutured in position and attached to gravity. Sample was sent for culture The patient tolerated the procedure well and remained hemodynamically stable throughout. No complications were encountered and no significant blood loss was encountered. IMPRESSION: Status post image guided percutaneous transhepatic cholangiogram with placement of an externalized biliary stent. Signed, Dulcy Fanny. Dellia Nims, ABVM, RPVI [facility] Radiology PLAN: 24-48 hours of decompression will be required and then another attempt at an internal/external biliary drain. Electronically Signed   By: Corrie Mckusick D.O.   On: 05/25/2022 17:15   DG ERCP  Result Date: 05/23/2022 CLINICAL DATA:  62 year old female with a history of jaundice EXAM: ERCP TECHNIQUE: Multiple spot images obtained with the fluoroscopic device and submitted for interpretation post-procedure. FLUOROSCOPY: Radiation Exposure Index (as provided by the fluoroscopic device): 4.5 mGy Kerma COMPARISON:  MR 05/21/2022 FINDINGS: Limited intraoperative fluoroscopic spot images during ERCP. Initial image demonstrates endoscope projecting over the upper abdomen. Subsequently  there is deployment of the safety wire, however, no infusion of contrast on the submitted images. IMPRESSION: Limited images during ERCP as above. Please refer to the dictated operative report for full details of intraoperative findings and procedure. Electronically Signed   By: Corrie Mckusick D.O.   On: 05/23/2022 15:50   DG C-Arm 1-60 Min-No Report  Result Date: 05/23/2022 Fluoroscopy was utilized by the requesting physician.  No radiographic interpretation.    MR ABDOMEN MRCP WO CONTRAST  Result Date: 05/22/2022 CLINICAL DATA:  Jaundice. EXAM: MRI ABDOMEN WITHOUT CONTRAST  (INCLUDING MRCP) TECHNIQUE: Multiplanar multisequence MR imaging of the abdomen was performed. Heavily T2-weighted images of the biliary and pancreatic ducts were obtained, and three-dimensional MRCP images were rendered by post processing. COMPARISON:  CT 05/21/2022 FINDINGS: Note: Exam detail is diminished. Patient aborted exam prior to completion of study. No composed contrast imaging obtained. Additionally, there is motion artifact which further diminishes diagnostic assessment of the area of concern within the head of pancreas. Lower chest: No pleural fluid identified. Atelectasis noted within both lung bases. Hepatobiliary: Within the limitations of motion artifact there is no focal liver abnormality. No significant gallbladder wall thickening. As noted on recent CT there is marked intrahepatic and common bile duct dilatation. The common bile duct measures 1.4 cm in maximum diameter, image 15/5. Abrupt termination of the common bile duct within the head of pancreas is noted. Pancreas: No pancreatic inflammation. There is diffuse main duct dilatation up to the level of the head of pancreas which measures 6 mm in maximum diameter, image 25/3. The dilated pancreatic duct terminates at a similar level as the dilated common bile duct. Here, there is a focal area of slight increased T2 signal and decreased T1 signal within the head of pancreas measuring approximately 1.9 x 0.7 cm, image 25/3 and image number 64/7. Imaging findings are highly suspicious for occult neoplasm within the head of pancreas, image 15/5. Spleen:  Within normal limits in size and appearance. Adrenals/Urinary Tract: Normal adrenal glands. No kidney mass or hydronephrosis. Stomach/Bowel: Visualized portions within the abdomen are unremarkable. Vascular/Lymphatic: Normal caliber of the abdominal aorta. No upper abdominal  adenopathy identified. Other:  No ascites. Musculoskeletal: No suspicious bone lesions identified. IMPRESSION: 1. Exam detail is diminished. Patient aborted exam prior to completion of study and prior to administration of intravenous contrast material. Additionally, there is motion artifact which further diminishes diagnostic assessment of the area of concern within the head of pancreas. 2. Marked intrahepatic and common bile duct dilatation. Abrupt termination of the common bile duct within the head of pancreas is noted. There is diffuse main duct dilatation up to the level of the head of pancreas which measures 6 mm in maximum diameter. The dilated main duct terminates at the same level as the termination of the CBD. Here, there is a subtle signal abnormality measuring approximately 1.9 x 0.7 cm. Imaging findings are concerning for underlying neoplasm within the head of pancreas. Advise further evaluation with ERCP and endoscopic ultrasound. Electronically Signed   By: Kerby Moors M.D.   On: 05/22/2022 04:31   CT ABDOMEN PELVIS W CONTRAST  Result Date: 05/21/2022 CLINICAL DATA:  Right upper quadrant pain EXAM: CT ABDOMEN AND PELVIS WITH CONTRAST TECHNIQUE: Multidetector CT imaging of the abdomen and pelvis was performed using the standard protocol following bolus administration of intravenous contrast. RADIATION DOSE REDUCTION: This exam was performed according to the departmental dose-optimization program which includes automated exposure control, adjustment of the mA and/or kV according  to patient size and/or use of iterative reconstruction technique. CONTRAST:  54m OMNIPAQUE IOHEXOL 350 MG/ML SOLN COMPARISON:  Abdominal sonogram 05/20/2022 and CT AP 08/01/2012 FINDINGS: Lower chest: Atelectasis and ground-glass attenuation noted within the posterior lung bases. No pleural fluid or airspace disease. Subpleural nodule in the anterior right upper lobe measures 2 mm Hepatobiliary: No focal liver lesion  identified. Stones noted on recent gallbladder ultrasound are not CT visible. There is no gallbladder wall thickening or pericholecystic fluid identified. Dilatation of the proximal common bile duct measures up to 1.3 cm. Abrupt termination the dilated common bile duct at the level of the head of pancreas, image 42/7. Pancreas: Unremarkable. No pancreatic ductal dilatation or surrounding inflammatory changes. Spleen: Normal in size without focal abnormality. Adrenals/Urinary Tract: Adrenal glands are unremarkable. Kidneys are normal, without renal calculi, focal lesion, or hydronephrosis. Bladder is unremarkable. Stomach/Bowel: Stomach is normal. The appendix is not confidently identified. No bowel wall thickening, inflammation, or distension. Vascular/Lymphatic: Aortic atherosclerosis. No aneurysm. No signs abdominopelvic adenopathy. Reproductive: Uterus and bilateral adnexa are unremarkable. Other: No ascites or focal fluid collections. No signs of pneumoperitoneum. Musculoskeletal: No acute or significant osseous findings. IMPRESSION: 1. Dilatation of the proximal common bile duct measures up to 1.3 cm. Abrupt termination the dilated common bile duct at the level of the head of pancreas. There is also main duct dilatation which abruptly terminates at the level of the head of pancreas, proximal to the ampulla. Imaging findings are concerning for underlying obstructing lesion within the head of pancreas. Further evaluation with contrast enhanced MRI/MRCP is advised. 2. Stones noted on recent gallbladder ultrasound are not CT visible. There is no gallbladder wall thickening or pericholecystic fluid identified. 3. 2 mm right upper lobe lung nodule. Nonspecific. See lung cancer screening CT from 04/15/2022 for follow-up recommendations. 4.  Aortic Atherosclerosis (ICD10-I70.0). Electronically Signed   By: TKerby MoorsM.D.   On: 05/21/2022 13:50   UK[place]Abdomen Limited RUQ (LIVER/GB)  Result Date:  05/20/2022 CLINICAL DATA:  Pain. EXAM: ULTRASOUND ABDOMEN LIMITED RIGHT UPPER QUADRANT COMPARISON:  05/17/2022 FINDINGS: Gallbladder: Cholelithiasis with multiple stones filling the gallbladder. No gallbladder wall thickening or edema. Murphy's sign is negative. Common bile duct: Diameter: 13 mm, dilated and enlarging since previous study. Liver: No focal lesion identified. Within normal limits in parenchymal echogenicity. Portal vein is patent on color Doppler imaging with normal direction of blood flow towards the liver. Other: None. IMPRESSION: 1. Cholelithiasis without additional changes to suggest acute cholecystitis. 2. Dilated common bile duct at 13 mm, increasing since previous study. Consider MRCP to exclude common duct stone or obstruction. Electronically Signed   By: WLucienne CapersM.D.   On: 05/20/2022 21:58     ASSESSMENT/PLAN:  This is a very pleasant 62year old African-American female with:   Pancreatic cancer in the of the head of the pancreas, diagnosed in January 2024  -She presented with abdominal pain and obstructive jaundice to the emergency room on 05/21/2022. She reports she had noticed dark urine and light stool a few weeks prior to the ER visit.  -MRI/MRCP/CT AP on 05/21/22 showed common bile duct dilation with abrupt termination at the level of the pancreatic head as well as main duct dilation which also abruptly terminated at the level of the pancreatic head proximal to the ampulla. At that location, there is a subtle signal abnormality measuring ~1.9 x 0.7 cm. Imaging findings are concerning for underlying neoplasm within the head of pancreas.  There is also a nonspecific 2 mm  right upper lobe pulmonary nodule noted on lung cancer screening from December 2023. She is a former smoker, having quit after her hospitalization. She smoked for about 50 years averaging 0.5-1 ppd.  -Unsuccessful cannulation during an attempted ERCP  -She underwent PTC with IR on 1/26 with external drain  placement. She had a drain converted to internal/external biliary drain with biliary brushings.  -The final pathology of the brushings showed adenocarcinoma.  -Baseline CA 19.9 was elevated at 158. -The patient was seen with Dr. Burr Medico today.  Dr. Burr Medico discussed the role of surgery.  Since this is an aggressive type of cancer, patients will also require chemotherapy whether adjuvant or neoadjuvant.  -Her case will be discussed at the multidisciplinary conference next week. -I will place a referral to general surgery. -The imaging does not show any vascular involvement, although these exams cannot be completed.  We will arrange for CT abdomen and pelvis with pancreatic protocol. -If we are going to proceed with neoadjuvant treatment, Dr. Burr Medico briefly discussed chemotherapy options with the patient today.  We would then arrange for Port-A-Cath placement, Emla cream, antiemetics, and a chemo education class.  We would also tentatively plan for the patient to start treatment in the next 2 to 3 weeks.  Dr. Burr Medico briefly discussed options for neoadjuvant chemotherapy such as 2 drug and 3 drug options.  We also briefly discussed adverse side effects such as fatigue, myelosuppression, nausea, vomiting, diarrhea, cold sensitivity, appetite changes, weight loss. -I have placed a referral to a surgeon who can discuss the logistics in more depth regarding surgery -Repeat CBC, CMP, and magnesium drawn today.  Her CBC showed anemia.  It looks like her hemoglobin was normal prior to her recent hospitalization but worsened during her hospitization, the patient mentions to me she has a history of anemia.  I will arrange for iron studies, B12, and folate at her next lab visit.  CMP continues to show improvement in her creatinine and her bilirubin is down to 3.1 today.  Her magnesium is low at 1.1. (see below) -We will arrange for follow-up visit after the multidisciplinary conference next week based on the final  recommendation  Hypomagnesemia -Hypomagnesemia noted during 2/9-2/12 hospitalization -Repeat mag remained low at 1.1 today.  -Recommended IV mag today with 4 g.  -She became agitated with staff in infusion room today. Refused IV magnesium today -Sent Rx for magnesium oxide p.o. to pharmacy to take 2-3x per day if able to tolerate without diarrhea. If unable to tolerate, would recommend cutting back to 1 tablet daily once diarrhea resolves or reconsider IV magnesium.  Obstructive Jaundice -Presented with elevated LFTs and elevated bilirubin at 14.9 -Has PTC biliary drain -Next follow-up visit with interventional radiology on 07/18/22 -She states today she needs more bandages. We called IR who will give her supplies.  -She left today before receiving her supplies -Repeat CMP today shows continued improvement in bili to 3.1 today -She is draining her bag about 2x per day.   Genetics -The patient qualifies for genetic testing.  Referral placed -She has a personal history of cervical cancer when she was 9-71 years old that was frozen -Brother with CRC dx when he was in mid 60's  AKI She presented to the clinic for outpatient visit on 06/10/2022 with significant hypotension, dizziness, abdominal pain, nausea, vomiting, and diarrhea -She has baseline normal kidney function but she was subsequently sent to the emergency room found to have significant acute kidney injury with elevated creatinine over 12 -  Her creatinine improved with IV fluids -Creatinine today 0.43  Right upper quadrant fluid collection -During her 2/9 to 2/12 small admission imaging showed large (11.0 x 6.8 x 8.5 cm) right upper quadrant fluid collection.  She was evaluated by interventional radiology who felt that this was an inflammatory phlegmon did not require any drainage.  The plan is for repeat CT scan outpatient.  -She denies pain or fever at this time.   Social -She lives alone in an apartment -Her friend  accompanies her to her appointments -No children. She has 2 sisters and a brother. Sister lives in [place] -She is a former smoker, having quit during recent hospitalizations. Smoked 50 years averaging <1 ppd. She denied alcohol or drug use to me today.  -She is on disability due to reported degenerative disk disease. She has insurance. She would be interested in social work consult if she will proceed with neoadjuvant chemotherapy   Plan:  -Referral to genetic counseling -Referral to surgeon -4 g of IV magnesium was recommended today. She became agitated in the infusion room today and refused to stay for IV magnesium. We discussed the risks with low magnesium prior to her infusion such as cardiac implications.  Will recommend that she take magnesium oxide p.o. BID-TID daily if she is able to tolerate without any diarrhea until we can recheck her labs at her next upcoming appointment.   -Will arrange anemia lab studies at next appointment -Order CT AP with pancreatic protocol -Discuss at multidisciplinary conference  -Will arrange follow up based on multidisciplinary conference recommendations. If she is going to get neoadjuvant chemo, we will arrange port-a-cath insertion, EMLA cream, anti-emetics, chemo class, and estimated start date of 2-3 weeks -Zofran sent to pharmacy  -Will pick up supplies for biliary drain from IR.     Orders Placed This Encounter  Procedures   CT ABD PELVIS W/WO CM ONCOLOGY PANCREATIC PROTOCOL    Standing Status:   Future    Standing Expiration Date:   06/18/2023    Order Specific Question:   If indicated for the ordered procedure, I authorize the administration of contrast media per Radiology protocol    Answer:   Yes    Order Specific Question:   Does the patient have a contrast media/X-ray dye allergy?    Answer:   No    Order Specific Question:   Is patient pregnant?    Answer:   No    Order Specific Question:   Preferred imaging location?    Answer:   [facility]    Order Specific Question:   Is Oral Contrast requested for this exam?    Answer:   Yes, Per Radiology protocol   Iron and Iron Binding Capacity ([facility] only)    Standing Status:   Future    Standing Expiration Date:   06/18/2023   Ferritin    Standing Status:   Future    Standing Expiration Date:   06/18/2023   Vitamin B12    Standing Status:   Future    Standing Expiration Date:   06/18/2023   Folate    Standing Status:   Future    Standing Expiration Date:   06/18/2023   Magnesium    Standing Status:   Future    Standing Expiration Date:   06/18/2023   Ambulatory referral to General Surgery    Referral Priority:   Urgent    Referral Type:   Surgical    Referral Reason:  Specialty Services Required    Requested Specialty:   General Surgery    Number of Visits Requested:   1   Ambulatory referral to Genetics    Referral Priority:   Routine    Referral Type:   Consultation    Referral Reason:   Specialty Services Required    Number of Visits Requested:   [place], PA-C 06/17/22  Addendum  I have seen the patient, examined her. I agree with the assessment and and plan and have edited the notes.   Ms Trescott has recovered well from her recent hospital stay.  I reviewed her diagnosis, and staging workup results.  She appears to have early stage pancreatic cancer in the pancreatic head.  It is not very well visualized on CT scan even with contrast, it is better seen on the MRI, measuring approximately 1.9 cm, vascular involvement is not commented on the MRI report.  We will review her images in the GI conference next week, will obtain a CT pancreatic protocol if needed.  Restaging CT scan otherwise negative for nodal or distant metastasis.  We discussed the option of upfront surgery, versus neoadjuvant chemotherapy before surgery.  She is open to chemotherapy if we recommend.  We discussed the option of FOLFIRINOX, versus gemcitabine and  Abraxane.  I think she is a candidate for FOLFIRINOX.  Will refer her to pancreatobiliary surgeon and genetics.  All questions were answered.  Truitt Merle MD 06/17/2022

## 2022-06-16 NOTE — Progress Notes (Signed)
Patient ID: Michelle Williams                 DOB: [birth year]                      MRN: [id number]      HPI: Michelle Williams is a 62 y.o. female referred by Dr. Irish Lack to HTN clinic. PMH is significant for HTN, tobacco abuse, RA. Seen by Dr. Ed Blalock on 05/06/22. Lisinopril 30m daily was added to her HTN medication regimen to help prevent further thickening of heart muscle.   Patient has since been diagnosed with pancreatic cancer. Has been in the hospital twice, most recently for n/v failure to thrive and AKI.   Patient presents today.  She was just discharged from the hospital 2 days ago.  Reports feeling much better and is excited to go home and cook some food.  She is not taking any blood pressure medications.  She sees her oncologist tomorrow.  She reports that she is not smoking and is not taking Chantix.  Current HTN meds: none Previously tried:  BP goal: <130/80  Family History: The patient's family history includes Colon cancer in her brother; Diabetes in her maternal grandmother; Early death in her father; HIV/AIDS in her brother; Hypertension in her mother; Stroke in her mother.    Social History: pt reports not smoking   Wt Readings from Last 3 Encounters:  06/13/22 153 lb 14.1 oz (69.8 kg)  06/10/22 144 lb 14.4 oz (65.7 kg)  05/06/22 162 lb 6.4 oz (73.7 kg)   BP Readings from Last 3 Encounters:  06/16/22 128/78  06/13/22 133/71  06/10/22 (!) 59/51   Pulse Readings from Last 3 Encounters:  06/16/22 85  06/13/22 89  06/10/22 86    Renal function: Estimated Creatinine Clearance: 65.2 mL/min (by C-G formula based on SCr of 0.66 mg/dL).  Past Medical History:  Diagnosis Date   Acute bronchitis    Allergy    Anemia    Anxiety    Arthritis    Cataract    BEGINIING   [place]ical cancer [place]    Degenerative joint disease    UPPER,LOWER BACK   Gallstones    H/O degenerative disc disease    H/O rheumatoid arthritis    Hypertension    Neuropathy    BILATERAL     Current Outpatient Medications on File Prior to Visit  Medication Sig Dispense Refill   albuterol (VENTOLIN HFA) 108 (90 Base) MCG/ACT inhaler Inhale 2 puffs into the lungs every 6 (six) hours as needed for wheezing or shortness of breath.     cyclobenzaprine (FLEXERIL) 10 MG tablet Take 10 mg by mouth at bedtime as needed for muscle spasms.  0   fluticasone (FLONASE) 50 MCG/ACT nasal spray Place 1 spray into both nostrils daily. (Patient taking differently: Place 1 spray into both nostrils daily as needed for allergies.) 9.9 mL 2   levocetirizine (XYZAL) 5 MG tablet Take 5 mg by mouth at bedtime as needed for allergies.     magnesium oxide (MAG-OX) 400 (240 Mg) MG tablet Take 1 tablet (400 mg total) by mouth daily for 10 days. 10 tablet 0   montelukast (SINGULAIR) 10 MG tablet Take 10 mg by mouth at bedtime.     ondansetron (ZOFRAN) 4 MG tablet Take 1 tablet (4 mg total) by mouth daily as needed for nausea or vomiting. (Patient not taking: Reported on 06/10/2022) 10 tablet 1   potassium chloride SA (  KLOR-CON M) 20 MEQ tablet Take 1 tablet (20 mEq total) by mouth daily for 5 days. 5 tablet 0   traZODone (DESYREL) 100 MG tablet Take 100 mg by mouth at bedtime as needed for sleep.     Varenicline Tartrate, Starter, (CHANTIX STARTING MONTH PAK) 0.5 MG X 11 & 1 MG X 42 TBPK Days 1 to 3: 0.5 mg once daily. Days 4 to 7: 0.5 mg twice daily. Maintenance (day 8 and later): 1 mg twice daily (Patient not taking: Reported on 06/16/2022) 1 each 0   No current facility-administered medications on file prior to visit.    No Known Allergies  Blood pressure 128/78, pulse 85, SpO2 99 %.   Assessment/Plan:  1. Hypertension -  Essential hypertension Assessment: Patient recently hospitalized for hypotension, AKI and nausea and vomiting Serum creatinine has improved back to baseline Patient feeling much better Blood pressure today 128/78 heart rate 85  Plan: Continue to remain off all blood pressure  medications Continue to hydrate well Check blood pressure from time to time to ensure he remains below 130/80  Thank you  Ramond Dial, Pharm.D, BCPS, CPP [facility] [address] [place], [place] [address]  Phone: [phone]; Fax: [phone]

## 2022-06-16 NOTE — Assessment & Plan Note (Signed)
Assessment: Patient recently hospitalized for hypotension, AKI and nausea and vomiting Serum creatinine has improved back to baseline Patient feeling much better Blood pressure today 128/78 heart rate 85  Plan: Continue to remain off all blood pressure medications Continue to hydrate well Check blood pressure from time to time to ensure he remains below 130/80

## 2022-06-17 ENCOUNTER — Inpatient Hospital Stay: Payer: 59

## 2022-06-17 ENCOUNTER — Inpatient Hospital Stay (HOSPITAL_BASED_OUTPATIENT_CLINIC_OR_DEPARTMENT_OTHER): Payer: 59 | Admitting: Physician Assistant

## 2022-06-17 ENCOUNTER — Other Ambulatory Visit: Payer: Self-pay

## 2022-06-17 ENCOUNTER — Telehealth: Payer: Self-pay

## 2022-06-17 DIAGNOSIS — C250 Malignant neoplasm of head of pancreas: Secondary | ICD-10-CM

## 2022-06-17 DIAGNOSIS — D649 Anemia, unspecified: Secondary | ICD-10-CM

## 2022-06-17 DIAGNOSIS — C259 Malignant neoplasm of pancreas, unspecified: Secondary | ICD-10-CM

## 2022-06-17 DIAGNOSIS — Z800 Family history of malignant neoplasm of digestive organs: Secondary | ICD-10-CM | POA: Diagnosis not present

## 2022-06-17 DIAGNOSIS — Z79899 Other long term (current) drug therapy: Secondary | ICD-10-CM | POA: Diagnosis not present

## 2022-06-17 LAB — CBC WITH DIFFERENTIAL (CANCER CENTER ONLY)
Abs Immature Granulocytes: 0.03 10*3/uL (ref 0.00–0.07)
Basophils Absolute: 0 10*3/uL (ref 0.0–0.1)
Basophils Relative: 0 %
Eosinophils Absolute: 0.1 10*3/uL (ref 0.0–0.5)
Eosinophils Relative: 1 %
HCT: 24.9 % — ABNORMAL LOW (ref 36.0–46.0)
Hemoglobin: 8.5 g/dL — ABNORMAL LOW (ref 12.0–15.0)
Immature Granulocytes: 0 %
Lymphocytes Relative: 21 %
Lymphs Abs: 1.9 10*3/uL (ref 0.7–4.0)
MCH: 31.7 pg (ref 26.0–34.0)
MCHC: 34.1 g/dL (ref 30.0–36.0)
MCV: 92.9 fL (ref 80.0–100.0)
Monocytes Absolute: 0.6 10*3/uL (ref 0.1–1.0)
Monocytes Relative: 7 %
Neutro Abs: 6.7 10*3/uL (ref 1.7–7.7)
Neutrophils Relative %: 71 %
Platelet Count: 501 10*3/uL — ABNORMAL HIGH (ref 150–400)
RBC: 2.68 MIL/uL — ABNORMAL LOW (ref 3.87–5.11)
RDW: 14.6 % (ref 11.5–15.5)
WBC Count: 9.4 10*3/uL (ref 4.0–10.5)
nRBC: 0 % (ref 0.0–0.2)

## 2022-06-17 LAB — CMP (CANCER CENTER ONLY)
ALT: 47 U/L — ABNORMAL HIGH (ref 0–44)
AST: 38 U/L (ref 15–41)
Albumin: 3.3 g/dL — ABNORMAL LOW (ref 3.5–5.0)
Alkaline Phosphatase: 274 U/L — ABNORMAL HIGH (ref 38–126)
Anion gap: 7 (ref 5–15)
BUN: 7 mg/dL (ref 6–20)
CO2: 22 mmol/L (ref 22–32)
Calcium: 7.8 mg/dL — ABNORMAL LOW (ref 8.9–10.3)
Chloride: 107 mmol/L (ref 98–111)
Creatinine: 0.43 mg/dL — ABNORMAL LOW (ref 0.44–1.00)
GFR, Estimated: >60 mL/min (ref >60)
Glucose, Bld: 91 mg/dL (ref 70–99)
Potassium: 4 mmol/L (ref 3.5–5.1)
Sodium: 136 mmol/L (ref 135–145)
Total Bilirubin: 3.1 mg/dL — ABNORMAL HIGH (ref 0.3–1.2)
Total Protein: 6.7 g/dL (ref 6.5–8.1)

## 2022-06-17 LAB — MAGNESIUM: Magnesium: 1.1 mg/dL — ABNORMAL LOW (ref 1.7–2.4)

## 2022-06-17 LAB — GENETIC SCREENING ORDER

## 2022-06-17 MED ORDER — MAGNESIUM OXIDE -MG SUPPLEMENT 400 (240 MG) MG PO TABS: 400.0000 mg | ORAL_TABLET | Freq: Every day | ORAL | 1 refills | Status: DC

## 2022-06-17 MED ORDER — ONDANSETRON HCL 8 MG PO TABS: 8.0000 mg | ORAL_TABLET | Freq: Three times a day (TID) | ORAL | 2 refills | Status: AC | PRN

## 2022-06-17 MED ORDER — MAGNESIUM SULFATE 4 GM/100ML IV SOLN
4.0000 g | Freq: Once | INTRAVENOUS | Status: DC
  Filled 2022-06-17: qty 100

## 2022-06-17 MED ORDER — MAGNESIUM OXIDE -MG SUPPLEMENT 400 (240 MG) MG PO TABS: 400.0000 mg | ORAL_TABLET | Freq: Two times a day (BID) | ORAL | 1 refills | Status: AC

## 2022-06-17 MED ORDER — SODIUM CHLORIDE 0.9 % IV SOLN: Freq: Once | INTRAVENOUS | Status: DC

## 2022-06-17 NOTE — Progress Notes (Unsigned)
I met with Michelle Williams after  her consultation with Cassie Heilingoetter, PA-C and Dr Burr Medico.  I explained my role as a nurse navigator and provided my contact information. I explained the services provided at [facility] and provided written information.  I explained the alight grant and let  her know one of the financial advisors will reach out to  her at the time of her chemo education class. I briefly explained insertion and care of a port a cath.  I showed a sample of the port a cath.  I told her that she will be scheduled for chemotherapy education class prior to receiving chemotherapy.  I recommended she bring another person to the chemo ed class  All questions were answered. She verbalized understanding.

## 2022-06-17 NOTE — Patient Instructions (Signed)
Summary: -It was very nice meeting you today.  I am so glad that you are feeling a lot better.  I know we covered a lot of important information.  Here is a summary of the main take away points. -When they did the biopsy and took a sample of the tumor, a pathologist looks at it under microscope and ran some test on it.  When he ran the test, he found that this is consistent with adenocarcinoma and is consistent with pancreatic cancer.  -From what we can tell so far, it looks like this is probably early stage.  We do not see any major vascular involvement although there is a special type of CT scan that can really help [place] make sure the spot is not invading any major structures or blood vessels nearby. -While this is early stage, pancreatic cancer in general is considered to be a more aggressive cancer.  Therefore, patient's do require chemotherapy wetter before surgery or after surgery. Dr. Burr Medico is going to discuss your case at the multidisciplinary conference next week with the surgeon to see if you would benefit from surgery first or chemotherapy first.  Oftentimes, patients tend to tolerate chemotherapy first upfront because some patients can have some nutritional concerns after surgery.  The type of surgery to remove the tumor is called a Whipple surgery.  I have referred you to a surgeon and they can discuss the logistics of that in more detail.  -If at the tumor conference next week they decide that you should proceed with chemotherapy first, we will discuss with you arranging for a chemo education class.  This is where you sit down one-on-one with the nurse educator for about an hour and she will discuss your specific treatment and a lot more detail.  Chemotherapy is typically 3 to 4 months followed by surgery, and then sometimes more chemotherapy after surgery.  -Also recommending getting a Port-A-Cath if you are proceeding with chemotherapy.  This way they can do your lab work and give you treatment  through the port so they do not need to use a vein in your arm.  We would arrange for you to receive a numbing cream at home that you put a quarter sized amount on top of your port 30 minutes before coming here and by the time you get here would be nice and numb to use. -Please be on the look out for a phone call from the scan department to schedule your CT scan.  If you need to get in touch with them, their number is [phone].  Stay in the line until you talk to her life representative.  They will help you schedule your scan from there.  Medications: -I have sent you prescription for Zofran to your pharmacy to take 1 tablet every 8 hours as needed for nausea and vomiting. -Send a refill of your magnesium.  Since we are unable to do IV magnesium today, please take 2 to 3 tablets/day.  If the magnesium causes loose stool, then please back off of the magnesium to 1 tablet/day.  Please try and drink electrolyte drinks such as Pedialyte if you ever experience diarrhea.  Referrals -I have referred you to a genetic counselor to assess if you carry any genetic alterations that would predispose you to cancer.  This is important to know because your family members would maybe need to be tested if there was found to be some hereditary component. -I have also referred you to a surgeon  Other instructions: -Please pick up the bandages from the interventional radiology department at the hospital for your biliary drain  Follow-up -Santiago Glad will be in touch with you after the conference next week on Wednesday to discuss what the final plan would be and would help make arrangements and follow-up. -If we are going to proceed with chemotherapy first, we would likely start this in the next 2 to 3 weeks.

## 2022-06-17 NOTE — Telephone Encounter (Signed)
This nurse reached out to patient and made her aware that the provider has called in an order for oral Magnesium tablets and she would like her to take one tab three times daily for one week and then decrease to Two times a day. Patient acknowledged understanding.  This nurse also advised that interventional radiology department has some wound supplies for her biliary drain site and she can go by and pick them up.  Patient states that she will come back and get them tomorrow because she is halfway home and she will not be coming back today.  This nurse acknowledged understanding.  No further questions or concerns noted at this time.  Patient knows to call clinic if she does have any questions or concerns.

## 2022-06-17 NOTE — Progress Notes (Signed)
Pt reported to infusion for 4 g of IV magnesium. This RN and another RN attempted to obtain IV access. Pt was stuck a total of 2 times. After the second unsuccessful attempt at obtaining IV access the Pt refused to be stuck again. Pt stated "I am not being stuck again, I will just come back since you all do not know how to do IV's." Pt was behaving agitated during and after IV attempts. During both IV attempts Pt jerked her arm away and c/o pain. This RN made Cassie PA-C aware of situation. Cassie recommended Pt to take oral magnesium TID or BID if the Pt can tolerate. Pt left before the RN could relay recommendation to Pt. Cassie PA-C made aware. Joy RN to call Pt with recommendation.

## 2022-06-18 LAB — CANCER ANTIGEN 19-9: CA 19-9: 37 U/mL — ABNORMAL HIGH (ref 0–35)

## 2022-06-20 ENCOUNTER — Encounter: Payer: Self-pay | Admitting: Physician Assistant

## 2022-06-22 ENCOUNTER — Telehealth: Payer: Self-pay | Admitting: Genetic Counselor

## 2022-06-22 ENCOUNTER — Telehealth: Payer: Self-pay | Admitting: Physician Assistant

## 2022-06-22 ENCOUNTER — Other Ambulatory Visit: Payer: Self-pay

## 2022-06-22 NOTE — Telephone Encounter (Signed)
[employer] BRCAplus and CancerNext-Expanded+RNA was ordered on 06/17/2022.  Lucille Passy, MS, [facility] Genetic Counselor [email or web address] (P) [phone]

## 2022-06-22 NOTE — Progress Notes (Signed)
The proposed treatment discussed in conference is for discussion purpose only and is not a binding recommendation.  The patients have not been physically examined, or presented with their treatment options.  Therefore, final treatment plans cannot be decided.  

## 2022-06-22 NOTE — Telephone Encounter (Signed)
Rescheduled patient to be seen sooner in genetics per staff message.Patient aware of appointment time and date.

## 2022-06-23 ENCOUNTER — Other Ambulatory Visit (HOSPITAL_COMMUNITY): Payer: 59

## 2022-06-29 ENCOUNTER — Encounter: Payer: Self-pay | Admitting: Genetic Counselor

## 2022-06-29 DIAGNOSIS — Z1379 Encounter for other screening for genetic and chromosomal anomalies: Secondary | ICD-10-CM | POA: Insufficient documentation

## 2022-06-30 ENCOUNTER — Ambulatory Visit (HOSPITAL_COMMUNITY)
Admission: RE | Admit: 2022-06-30 | Discharge: 2022-06-30 | Disposition: A | Discharge status: Home / Self Care | Payer: 59 | Source: Ambulatory Visit | Attending: Physician Assistant | Admitting: Physician Assistant

## 2022-06-30 DIAGNOSIS — C250 Malignant neoplasm of head of pancreas: Secondary | ICD-10-CM | POA: Diagnosis present

## 2022-06-30 MED ORDER — IOHEXOL 300 MG/ML  SOLN
80.0000 mL | Freq: Once | INTRAMUSCULAR | Status: AC | PRN
  Administered 2022-06-30: 80 mL via INTRAVENOUS

## 2022-06-30 MED ORDER — SODIUM CHLORIDE (PF) 0.9 % IJ SOLN
INTRAMUSCULAR | Status: AC
  Filled 2022-06-30: qty 50

## 2022-07-01 ENCOUNTER — Telehealth: Payer: Self-pay | Admitting: Physician Assistant

## 2022-07-01 NOTE — Telephone Encounter (Signed)
I called the patient to check on her to see how she was feeling after her CT scan.  She states she is feeling fine today.  Denies any urinary abnormalities.  She states she is seeing a Psychologist, sport and exercise on Monday, 07/04/2022.

## 2022-07-04 ENCOUNTER — Inpatient Hospital Stay: Payer: 59 | Attending: Physician Assistant | Admitting: Licensed Clinical Social Worker

## 2022-07-04 ENCOUNTER — Encounter: Payer: 59 | Admitting: Physician Assistant

## 2022-07-04 ENCOUNTER — Other Ambulatory Visit: Payer: 59

## 2022-07-04 DIAGNOSIS — C250 Malignant neoplasm of head of pancreas: Secondary | ICD-10-CM | POA: Insufficient documentation

## 2022-07-04 DIAGNOSIS — Z79899 Other long term (current) drug therapy: Secondary | ICD-10-CM | POA: Insufficient documentation

## 2022-07-04 DIAGNOSIS — Z1379 Encounter for other screening for genetic and chromosomal anomalies: Secondary | ICD-10-CM

## 2022-07-04 DIAGNOSIS — Z800 Family history of malignant neoplasm of digestive organs: Secondary | ICD-10-CM

## 2022-07-04 NOTE — Progress Notes (Signed)
REFERRING PROVIDER: Truitt Merle, MD [place],  [place] [address]  PRIMARY PROVIDER:  Cipriano Mile, NP  PRIMARY REASON FOR VISIT:  1. Malignant neoplasm of head of pancreas ([place])   2. Genetic testing   3. Family history of colon cancer      HISTORY OF PRESENT ILLNESS:   Michelle Williams, a 62 y.o. female, was seen for a [place] cancer genetics consultation at the request of Dr. Burr Medico due to a personal and family history of cancer.  Michelle Williams presents to clinic today to discuss the possibility of a hereditary predisposition to cancer, genetic testing, and to further clarify her future cancer risks, as well as potential cancer risks for family members.   CANCER HISTORY:  In 2024, at the age of 78, Michelle Williams was diagnosed with pancreatic cancer.   Oncology History  Pancreatic cancer ([place])  05/17/2022 Imaging   US Abdomen Limited RUQ   IMPRESSION: 1. Cholelithiasis without secondary signs of acute cholecystitis. 2. Common bile duct is mildly prominent at 7.6 mm. Recommend correlation with LFTs. In the setting of abnormal LFTs, recommend correlation with MRI/MRCP.   05/20/2022 Imaging   US Abdomen Limited RUQ   IMPRESSION: 1. Cholelithiasis without additional changes to suggest acute cholecystitis. 2. Dilated common bile duct at 13 mm, increasing since previous study. Consider MRCP to exclude common duct stone or obstruction.     05/21/2022 Imaging   CT ABDOMEN PELVIS W CONTRAST   IMPRESSION: 1. Dilatation of the proximal common bile duct measures up to 1.3 cm. Abrupt termination the dilated common bile duct at the level of the head of pancreas. There is also main duct dilatation which abruptly terminates at the level of the head of pancreas, proximal to the ampulla. Imaging findings are concerning for underlying obstructing lesion within the head of pancreas. Further evaluation with contrast enhanced MRI/MRCP is advised. 2. Stones noted on recent  gallbladder ultrasound are not CT visible. There is no gallbladder wall thickening or pericholecystic fluid identified. 3. 2 mm right upper lobe lung nodule. Nonspecific. See lung cancer screening CT from 04/15/2022 for follow-up recommendations. 4.  Aortic Atherosclerosis (ICD10-I70.0).   05/21/2022 Imaging   MR ABDOMEN MRCP WO CONTRAST   IMPRESSION: 1. Exam detail is diminished. Patient aborted exam prior to completion of study and prior to administration of intravenous contrast material. Additionally, there is motion artifact which further diminishes diagnostic assessment of the area of concern within the head of pancreas. 2. Marked intrahepatic and common bile duct dilatation. Abrupt termination of the common bile duct within the head of pancreas is noted. There is diffuse main duct dilatation up to the level of the head of pancreas which measures 6 mm in maximum diameter. The dilated main duct terminates at the same level as the termination of the CBD. Here, there is a subtle signal abnormality measuring approximately 1.9 x 0.7 cm. Imaging findings are concerning for underlying neoplasm within the head of pancreas. Advise further evaluation with ERCP and endoscopic ultrasound.      05/23/2022 Procedure   ERCP  -Normal appearing major papilla -Unable to cannulate either duct   05/25/2022 Procedure   IR BILIARY DRAIN PLACEMENT WITH CHOLANGIOGRAM    05/27/2022 Procedure   IR INT EXT BILIARY DRAIN WITH CHOLANGIOGRAM    05/27/2022 Procedure   IR ENDOLUMINAL BX OF BILIARY TREE    05/27/2022 Cancer Staging   Staging form: Exocrine Pancreas, AJCC 8th Edition - Clinical stage from 05/27/2022: Stage IA (cT1c, cN0,  cM0) - Signed by Truitt Merle, MD on 06/10/2022 Total positive nodes: 0   06/10/2022 Initial Diagnosis   Pancreatic cancer [facility])    Tumor Marker   Patient's tumor was tested for the following markers: CA 19.9. Results of the tumor marker test revealed 158.    Genetic  Testing   [facility] CancerNext-Expanded Panel+RNA was Negative. Report date is 06/28/2022.  The CancerNext-Expanded gene panel offered by [facility] and includes sequencing, rearrangement, and RNA analysis for the following 77 genes: AIP, ALK, APC, ATM, AXIN2, BAP1, BARD1, BLM, BMPR1A, BRCA1, BRCA2, BRIP1, CDC73, CDH1, CDK4, CDKN1B, CDKN2A, CHEK2, CTNNA1, DICER1, FANCC, FH, FLCN, GALNT12, KIF1B, LZTR1, MAX, MEN1, MET, MLH1, MSH2, MSH3, MSH6, MUTYH, NBN, NF1, NF2, NTHL1, PALB2, PHOX2B, PMS2, POT1, PRKAR1A, PTCH1, PTEN, RAD51C, RAD51D, RB1, RECQL, RET, SDHA, SDHAF2, SDHB, SDHC, SDHD, SMAD4, SMARCA4, SMARCB1, SMARCE1, STK11, SUFU, TMEM127, TP53, TSC1, TSC2, VHL and XRCC2 (sequencing and deletion/duplication); EGFR, EGLN1, HOXB13, KIT, MITF, PDGFRA, POLD1, and POLE (sequencing only); EPCAM and GREM1 (deletion/duplication only).       Past Medical History:  Diagnosis Date   Acute bronchitis    Allergy    Anemia    Anxiety    Arthritis    Cataract    BEGINIING   Cervical cancer ([place])    Degenerative joint disease    UPPER,LOWER BACK   Gallstones    H/O degenerative disc disease    H/O rheumatoid arthritis    Hypertension    Neuropathy    BILATERAL    Past Surgical History:  Procedure Laterality Date   BREAST EXCISIONAL BIOPSY Right    COLONOSCOPY     2018   ERCP N/A 05/23/2022   Procedure: ENDOSCOPIC RETROGRADE CHOLANGIOPANCREATOGRAPHY (ERCP);  Surgeon: Ladene Artist, MD;  Location: [facility];  Service: Gastroenterology;  Laterality: N/A;   IR BILIARY DRAIN PLACEMENT WITH CHOLANGIOGRAM  05/25/2022   IR ENDOLUMINAL BX OF BILIARY TREE  05/30/2022   IR FALLOPIAN TUBE CATHETERIZATION     IR INT EXT BILIARY DRAIN WITH CHOLANGIOGRAM  05/27/2022   POLYPECTOMY      FAMILY HISTORY:  We obtained a detailed, 4-generation family history.  Significant diagnoses are listed below: Family History  Problem Relation Age of Onset   Hypertension Mother    Stroke Mother    Early death Father     Colon cancer Brother        unsure age when diagnosed    HIV/AIDS Brother    Diabetes Maternal Grandmother    Colon polyps Neg Hx    Esophageal cancer Neg Hx    Rectal cancer Neg Hx    Stomach cancer Neg Hx    Crohn's disease Neg Hx    Ulcerative colitis Neg Hx    Ms. Stoltman has 2 brothers and 2 sisters. One brother had colon cancer, he is living in his late 57s. No other known cancers in the family.  Ms. Arzt is unaware of previous family history of genetic testing for hereditary cancer risks. There is no reported Ashkenazi Jewish ancestry. There is no known consanguinity.    GENETIC COUNSELING ASSESSMENT: Ms. Machorro is a 62 y.o. female with a personal history of pancreatic cancer which is somewhat suggestive of a hereditary cancer syndrome and predisposition to cancer. We, therefore, discussed and recommended the following at today's visit.   DISCUSSION: We discussed that approximately 10% of pancreatic cancer is hereditary. Most cases of hereditary pancreatic cancer are associated with BRCA1/BRCA2 genes, although there are other genes associated with hereditary  cancer as well. Cancers and risks are gene specific. We discussed that testing is beneficial for several reasons including knowing about cancer risks, identifying potential screening and risk-reduction options that may be appropriate, and to understand if other family members could be at risk for cancer and allow them to undergo genetic testing.   We reviewed the characteristics, features and inheritance patterns of hereditary cancer syndromes. We also discussed genetic testing, including the appropriate family members to test, the process of testing, insurance coverage and turn-around-time for results. We discussed the implications of a negative, positive and/or variant of uncertain significant result. We recommended Ms. Madalyn Rob pursue genetic testing for the [facility] CancerNext-Expanded+RNA gene panel. She had blood drawn for this  06/17/2022.  GENETIC TEST RESULTS:  The [facility] CancerNext-Expanded+RNA Panel found no pathogenic mutations.   The CancerNext-Expanded + RNAinsight gene panel offered by [facility] [facility] and includes sequencing and rearrangement analysis for the following 71 genes: AIP, ALK, APC*, ATM*, AXIN2, BAP1, BARD1, BMPR1A, BRCA1*, BRCA2*, BRIP1*, CDC73, CDH1*,CDK4, CDKN1B, CDKN2A, CHEK2*, CTNNA1, DICER1, FH, FLCN, KIF1B, LZTR1, MAX, MEN1, MET, MLH1*, MSH2*, MSH3, MSH6*, MUTYH*, NF1*, NF2, NTHL1, PALB2*, PHOX2B, PMS2*, POT1, PRKAR1A, PTCH1, PTEN*, RAD51C*, RAD51D*,RB1, RET, SDHA, SDHAF2, SDHB, SDHC, SDHD, SMAD4, SMARCA4, SMARCB1, SMARCE1, STK11, SUFU, TMEM127, TP53*,TSC1, TSC2, VHL; EGFR, EGLN1, HOXB13, KIT, MITF, PDGFRA, POLD1 and POLE (sequencing only); EPCAM and GREM1 (deletion/duplication only).   The test report has been scanned into EPIC and is located under the Molecular Pathology section of the Results Review tab.  A portion of the result report is included below for reference. Genetic testing reported out on 06/28/2022.     Even though a pathogenic variant was not identified, possible explanations for the cancer in the family may include: There may be no hereditary risk for cancer in the family. The cancers in Ms. Ding and/or her family may be sporadic/familial or due to other genetic and environmental factors. There may be a gene mutation in one of these genes that current testing methods cannot detect but that chance is small. There could be another gene that has not yet been discovered, or that we have not yet tested, that is responsible for the cancer diagnoses in the family.  It is also possible there is a hereditary cause for the cancer in the family that Ms. Ciolino did not inherit.  Therefore, it is important to remain in touch with cancer genetics in the future so that we can continue to offer Ms. Zeiger the most up to date genetic testing.   ADDITIONAL GENETIC TESTING:  We discussed with  Ms. Noreen that her genetic testing was fairly extensive.  If there are additional relevant genes identified to increase cancer risk that can be analyzed in the future, we would be happy to discuss and coordinate this testing at that time.    CANCER SCREENING RECOMMENDATIONS:  Ms. Ungar test result is considered negative (normal).  This means that we have not identified a hereditary cause for her personal and family history of cancer at this time.   An individual's cancer risk and medical management are not determined by genetic test results alone. Overall cancer risk assessment incorporates additional factors, including personal medical history, family history, and any available genetic information that may result in a personalized plan for cancer prevention and surveillance. Therefore, it is recommended she continue to follow the cancer management and screening guidelines provided by her oncology and primary healthcare provider.  RECOMMENDATIONS FOR FAMILY MEMBERS:   Since she did not inherit a  identifiable mutation in a cancer predisposition gene included on this panel, her children could not have inherited a known mutation from her in one of these genes. Individuals in this family might be at some increased risk of developing cancer, over the general population risk, due to the family history of cancer.  Individuals in the family should notify their providers of the family history of cancer. We recommend women in this family have a yearly mammogram beginning at age 52, or 24 years younger than the earliest onset of cancer, an annual clinical breast exam, and perform monthly breast self-exams.  Family members should have colonoscopies by at age 69, or earlier, as recommended by their providers.  FOLLOW-UP:  Lastly, we discussed with Ms. Skorupski that cancer genetics is a rapidly advancing field and it is possible that new genetic tests will be appropriate for her and/or her family members in the  future. We encouraged her to remain in contact with cancer genetics on an annual basis so we can update her personal and family histories and let her know of advances in cancer genetics that may benefit this family.   Faith Rogue, MS, [facility] Genetic Counselor [email or web address] Phone: [phone]  The patient was seen for a total of 15 minutes in face-to-face genetic counseling.  Dr. Grayland Ormond was available for discussion regarding this case. Patient's friend Richardson Landry was present and requested that patient had vitals checked before leaving. She was referred to symptom management clinic.   _______________________________________________________________________ For Office Staff:  Number of people involved in session: 2 Was an Intern/ student involved with case: no

## 2022-07-07 ENCOUNTER — Other Ambulatory Visit: Payer: Self-pay

## 2022-07-07 ENCOUNTER — Inpatient Hospital Stay (HOSPITAL_BASED_OUTPATIENT_CLINIC_OR_DEPARTMENT_OTHER): Payer: 59 | Admitting: Hematology

## 2022-07-07 ENCOUNTER — Telehealth: Payer: Self-pay | Admitting: Hematology

## 2022-07-07 ENCOUNTER — Encounter: Payer: Self-pay | Admitting: Hematology

## 2022-07-07 DIAGNOSIS — C250 Malignant neoplasm of head of pancreas: Secondary | ICD-10-CM

## 2022-07-07 DIAGNOSIS — K861 Other chronic pancreatitis: Secondary | ICD-10-CM | POA: Diagnosis not present

## 2022-07-07 MED ORDER — PROCHLORPERAZINE MALEATE 10 MG PO TABS: 10.0000 mg | ORAL_TABLET | Freq: Four times a day (QID) | ORAL | 1 refills | Status: DC | PRN

## 2022-07-07 MED ORDER — LIDOCAINE-PRILOCAINE 2.5-2.5 % EX CREA: TOPICAL_CREAM | CUTANEOUS | 3 refills | Status: DC

## 2022-07-07 MED ORDER — ONDANSETRON HCL 8 MG PO TABS: 8.0000 mg | ORAL_TABLET | Freq: Three times a day (TID) | ORAL | 1 refills | Status: DC | PRN

## 2022-07-07 NOTE — Progress Notes (Signed)
Order placed per Dr Burr Medico request.

## 2022-07-07 NOTE — Assessment & Plan Note (Addendum)
-  Stage IA, diagnosed in 05/2022 -Presented with abdominal pain and obstructive jaundice, status post PTC, after failed ERCP -She was hospitalized for AKI shortly after her diagnosis, recovered completely. -I reviewed her recent CT scan, which showed complex ill-defined fluid collection involving the region of the head of the pancreas, concerning for pancreatitis.  I discussed the findings with patient.  Due to the concern of pancreatitis, Dr. Zenia Resides does not recommend upfront surgery, we recommend neoadjuvant chemotherapy. -I discussed the option of FOLFIRINOX and gemcitabine/Abraxane regimens.  The potential benefit and side effects were discussed with her in detail.  She would be a candidate for FOLFIRINOX, plan to start after port placement on March 18 --Chemotherapy consent: Side effects including but does not not limited to, fatigue, nausea, vomiting, diarrhea, hair loss, photosensitivity and peripheral neuropathy, fluid retention, renal and kidney dysfunction, neutropenic fever, needed for blood transfusion, bleeding, were discussed with patient in great detail. She agrees to proceed. -The goal of chemotherapy is curative -She lives alone, but her niece will come to stay with her during her chemotherapy to support her.

## 2022-07-07 NOTE — Telephone Encounter (Signed)
Contacted patient to scheduled appointments. Patient is aware of appointments that are scheduled.   

## 2022-07-07 NOTE — Progress Notes (Signed)
[place]   [phone] [phone]   Clinic Follow up Note   Patient Care Team: Cipriano Mile, NP as PCP - General Jettie Booze, MD as PCP - Cardiology (Cardiology) Truitt Merle, MD as Consulting Physician (Hematology)  Date of Service:  07/07/2022  I connected with Michelle Williams on 07/07/2022 at  3:00 PM EST by telephone visit and verified that I am speaking with the correct person using two identifiers.  I discussed the limitations, risks, security and privacy concerns of performing an evaluation and management service by telephone and the availability of in person appointments. I also discussed with the patient that there may be a patient responsible charge related to this service. The patient expressed understanding and agreed to proceed.   Other persons participating in the visit and their role in the encounter:  No  Patient's location:  Home Provider's location:  Office  CHIEF COMPLAINT: f/u of pancreatic cancer    CURRENT THERAPY:  FOLFIRI  ASSESSMENT & PLAN:  Michelle Williams is a 62 y.o. female with    Pancreatic cancer ([place]) -Stage IA, diagnosed in 05/2022 -Presented with abdominal pain and obstructive jaundice, status post PTC, after failed ERCP -She was hospitalized for AKI shortly after her diagnosis, recovered completely. -I reviewed her recent CT scan, which showed complex ill-defined fluid collection involving the region of the head of the pancreas, concerning for pancreatitis.  I discussed the findings with patient.  Due to the concern of pancreatitis, Dr. Zenia Resides does not recommend upfront surgery, we recommend neoadjuvant chemotherapy. -I discussed the option of FOLFIRINOX and gemcitabine/Abraxane regimens.  The potential benefit and side effects were discussed with her in detail.  She would be a candidate for FOLFIRINOX, plan to start after port placement on March 18 --Chemotherapy consent: Side effects including but does  not not limited to, fatigue, nausea, vomiting, diarrhea, hair loss, photosensitivity and peripheral neuropathy, fluid retention, renal and kidney dysfunction, neutropenic fever, needed for blood transfusion, bleeding, were discussed with patient in great detail. She agrees to proceed. -The goal of chemotherapy is curative -She lives alone, but her niece will come to stay with her during her chemotherapy to support her.  Plan: - Recommend chemo before surgery  -Discuss 3 drug  chemo regiment FOLFIRINOX, she agrees to proceed  -also discuss 2 drug chemo regiment gem/Abraxane if she does not tolerate chemo well  Request Port placement schedule  3/18 -Chemo class 3/14 -lab,f/u and chemo 07/20/2022    SUMMARY OF ONCOLOGIC HISTORY: Oncology History Overview Note   Cancer Staging  Pancreatic cancer [facility]) Staging form: Exocrine Pancreas, AJCC 8th Edition - Clinical stage from 05/27/2022: Stage IA (cT1c, cN0, cM0) - Signed by Truitt Merle, MD on 06/10/2022 Total positive nodes: 0     Pancreatic cancer ([place])  05/17/2022 Imaging   US Abdomen Limited RUQ   IMPRESSION: 1. Cholelithiasis without secondary signs of acute cholecystitis. 2. Common bile duct is mildly prominent at 7.6 mm. Recommend correlation with LFTs. In the setting of abnormal LFTs, recommend correlation with MRI/MRCP.   05/20/2022 Imaging   US Abdomen Limited RUQ   IMPRESSION: 1. Cholelithiasis without additional changes to suggest acute cholecystitis. 2. Dilated common bile duct at 13 mm, increasing since previous study. Consider MRCP to exclude common duct stone or obstruction.     05/21/2022 Imaging   CT ABDOMEN PELVIS W CONTRAST   IMPRESSION: 1. Dilatation of the proximal common bile duct measures up to 1.3 cm. Abrupt termination the  dilated common bile duct at the level of the head of pancreas. There is also main duct dilatation which abruptly terminates at the level of the head of pancreas, proximal to the ampulla.  Imaging findings are concerning for underlying obstructing lesion within the head of pancreas. Further evaluation with contrast enhanced MRI/MRCP is advised. 2. Stones noted on recent gallbladder ultrasound are not CT visible. There is no gallbladder wall thickening or pericholecystic fluid identified. 3. 2 mm right upper lobe lung nodule. Nonspecific. See lung cancer screening CT from 04/15/2022 for follow-up recommendations. 4.  Aortic Atherosclerosis (ICD10-I70.0).   05/21/2022 Imaging   MR ABDOMEN MRCP WO CONTRAST   IMPRESSION: 1. Exam detail is diminished. Patient aborted exam prior to completion of study and prior to administration of intravenous contrast material. Additionally, there is motion artifact which further diminishes diagnostic assessment of the area of concern within the head of pancreas. 2. Marked intrahepatic and common bile duct dilatation. Abrupt termination of the common bile duct within the head of pancreas is noted. There is diffuse main duct dilatation up to the level of the head of pancreas which measures 6 mm in maximum diameter. The dilated main duct terminates at the same level as the termination of the CBD. Here, there is a subtle signal abnormality measuring approximately 1.9 x 0.7 cm. Imaging findings are concerning for underlying neoplasm within the head of pancreas. Advise further evaluation with ERCP and endoscopic ultrasound.      05/23/2022 Procedure   ERCP  -Normal appearing major papilla -Unable to cannulate either duct   05/25/2022 Procedure   IR BILIARY DRAIN PLACEMENT WITH CHOLANGIOGRAM    05/27/2022 Procedure   IR INT EXT BILIARY DRAIN WITH CHOLANGIOGRAM    05/27/2022 Procedure   IR ENDOLUMINAL BX OF BILIARY TREE    05/27/2022 Cancer Staging   Staging form: Exocrine Pancreas, AJCC 8th Edition - Clinical stage from 05/27/2022: Stage IA (cT1c, cN0, cM0) - Signed by Truitt Merle, MD on 06/10/2022 Total positive nodes: 0   06/10/2022  Initial Diagnosis   Pancreatic cancer [facility])    Tumor Marker   Patient's tumor was tested for the following markers: CA 19.9. Results of the tumor marker test revealed 158.    Genetic Testing   [facility] CancerNext-Expanded Panel+RNA was Negative. Report date is 06/28/2022.  The CancerNext-Expanded gene panel offered by [facility] and includes sequencing, rearrangement, and RNA analysis for the following 77 genes: AIP, ALK, APC, ATM, AXIN2, BAP1, BARD1, BLM, BMPR1A, BRCA1, BRCA2, BRIP1, CDC73, CDH1, CDK4, CDKN1B, CDKN2A, CHEK2, CTNNA1, DICER1, FANCC, FH, FLCN, GALNT12, KIF1B, LZTR1, MAX, MEN1, MET, MLH1, MSH2, MSH3, MSH6, MUTYH, NBN, NF1, NF2, NTHL1, PALB2, PHOX2B, PMS2, POT1, PRKAR1A, PTCH1, PTEN, RAD51C, RAD51D, RB1, RECQL, RET, SDHA, SDHAF2, SDHB, SDHC, SDHD, SMAD4, SMARCA4, SMARCB1, SMARCE1, STK11, SUFU, TMEM127, TP53, TSC1, TSC2, VHL and XRCC2 (sequencing and deletion/duplication); EGFR, EGLN1, HOXB13, KIT, MITF, PDGFRA, POLD1, and POLE (sequencing only); EPCAM and GREM1 (deletion/duplication only).     06/30/2022 Imaging    IMPRESSION: Once again enlarged complex ill-defined fluid collection involving the region of the head of the pancreas and in the adjacent mesentery similar to previous. Please correlate for a known history including sequela of pancreatitis with possible phlegmon. Acute peripancreatic fluid collection   Stable severe dilatation of the pancreatic duct with atrophy of the pancreas and abrupt change of the pancreatic head. Please correlate for known history of pancreatic adenocarcinoma.   Indwelling PTC catheter.   Slightly heterogeneous enhancement of the kidneys. No stranding. Please correlate  for renal function signs of UTI or renal infection. Recommend follow-up   07/20/2022 -  Chemotherapy   Patient is on Treatment Plan : PANCREAS Modified FOLFIRINOX q14d x 4 cycles        INTERVAL HISTORY:  Michelle Williams was contacted for a follow up of pancreatic  cancer  . She was last seen by PA-C Cassie on 06/17/2022. Pt stated she didn't have a ride to see Dr.Allen. Pt stated she has been eating well and drinks 3 bottles of water. Pt also stated she has been cleaning up and moving around.   All other systems were reviewed with the patient and are negative.  MEDICAL HISTORY:  Past Medical History:  Diagnosis Date   Acute bronchitis    Allergy    Anemia    Anxiety    Arthritis    Cataract    BEGINIING   Cervical cancer ([place])    Degenerative joint disease    UPPER,LOWER BACK   Gallstones    H/O degenerative disc disease    H/O rheumatoid arthritis    Hypertension    Neuropathy    BILATERAL    SURGICAL HISTORY: Past Surgical History:  Procedure Laterality Date   BREAST EXCISIONAL BIOPSY Right    COLONOSCOPY     2018   ERCP N/A 05/23/2022   Procedure: ENDOSCOPIC RETROGRADE CHOLANGIOPANCREATOGRAPHY (ERCP);  Surgeon: Ladene Artist, MD;  Location: [place];  Service: Gastroenterology;  Laterality: N/A;   IR BILIARY DRAIN PLACEMENT WITH CHOLANGIOGRAM  05/25/2022   IR ENDOLUMINAL BX OF BILIARY TREE  05/30/2022   IR FALLOPIAN TUBE CATHETERIZATION     IR INT EXT BILIARY DRAIN WITH CHOLANGIOGRAM  05/27/2022   POLYPECTOMY      I have reviewed the social history and family history with the patient and they are unchanged from previous note.  ALLERGIES:  has No Known Allergies.  MEDICATIONS:  Current Outpatient Medications  Medication Sig Dispense Refill   albuterol (VENTOLIN HFA) 108 (90 Base) MCG/ACT inhaler Inhale 2 puffs into the lungs every 6 (six) hours as needed for wheezing or shortness of breath.     cyclobenzaprine (FLEXERIL) 10 MG tablet Take 10 mg by mouth at bedtime as needed for muscle spasms.  0   fluticasone (FLONASE) 50 MCG/ACT nasal spray Place 1 spray into both nostrils daily. (Patient taking differently: Place 1 spray into both nostrils daily as needed for allergies.) 9.9 mL 2   levocetirizine (XYZAL) 5 MG tablet  Take 5 mg by mouth at bedtime as needed for allergies.     lidocaine-prilocaine (EMLA) cream Apply to affected area once 30 g 3   magnesium oxide (MAG-OX) 400 (240 Mg) MG tablet Take 1 tablet (400 mg total) by mouth 2 (two) times daily. 90 tablet 1   montelukast (SINGULAIR) 10 MG tablet Take 10 mg by mouth at bedtime.     ondansetron (ZOFRAN) 8 MG tablet Take 1 tablet (8 mg total) by mouth every 8 (eight) hours as needed for nausea or vomiting. 30 tablet 2   ondansetron (ZOFRAN) 8 MG tablet Take 1 tablet (8 mg total) by mouth every 8 (eight) hours as needed for nausea or vomiting. Start on the third day after cisplatin 30 tablet 1   potassium chloride SA (KLOR-CON M) 20 MEQ tablet Take 1 tablet (20 mEq total) by mouth daily for 5 days. 5 tablet 0   prochlorperazine (COMPAZINE) 10 MG tablet Take 1 tablet (10 mg total) by mouth every 6 (six) hours as  needed for nausea or vomiting (Nausea or vomiting). 30 tablet 1   traZODone (DESYREL) 100 MG tablet Take 100 mg by mouth at bedtime as needed for sleep.     Varenicline Tartrate, Starter, (CHANTIX STARTING MONTH PAK) 0.5 MG X 11 & 1 MG X 42 TBPK Days 1 to 3: 0.5 mg once daily. Days 4 to 7: 0.5 mg twice daily. Maintenance (day 8 and later): 1 mg twice daily 1 each 0   No current facility-administered medications for this visit.    PHYSICAL EXAMINATION: ECOG PERFORMANCE STATUS: 0 - Asymptomatic  There were no vitals filed for this visit. Wt Readings from Last 3 Encounters:  06/17/22 150 lb 11.2 oz (68.4 kg)  06/13/22 153 lb 14.1 oz (69.8 kg)  06/10/22 144 lb 14.4 oz (65.7 kg)     No vitals taken today, Exam not performed today  LABORATORY DATA:  I have reviewed the data as listed    Latest Ref Rng & Units 06/17/2022    9:04 AM 06/13/2022    4:12 AM 06/12/2022    4:32 AM  CBC  WBC 4.0 - 10.5 K/uL 9.4  16.1  12.7   Hemoglobin 12.0 - 15.0 g/dL 8.5  8.0  8.3   Hematocrit 36.0 - 46.0 % 24.9  23.9  24.9   Platelets 150 - 400 K/uL 501  287  328          Latest Ref Rng & Units 06/17/2022    9:04 AM 06/13/2022    4:12 AM 06/12/2022    4:32 AM  CMP  Glucose 70 - 99 mg/dL 91  109  119   BUN 6 - 20 mg/dL 7  16  62   Creatinine 0.44 - 1.00 mg/dL 0.43  0.66  1.09   Sodium 135 - 145 mmol/L 136  137  138   Potassium 3.5 - 5.1 mmol/L 4.0  3.4  3.5   Chloride 98 - 111 mmol/L 107  98  94   CO2 22 - 32 mmol/L '22  28  31   '$ Calcium 8.9 - 10.3 mg/dL 7.8  7.2  7.3   Total Protein 6.5 - 8.1 g/dL 6.7  6.2  6.1   Total Bilirubin 0.3 - 1.2 mg/dL 3.1  3.8  4.2   Alkaline Phos 38 - 126 U/L 274  151  148   AST 15 - 41 U/L 38  43  39   ALT 0 - 44 U/L 47  49  52       RADIOGRAPHIC STUDIES: I have personally reviewed the radiological images as listed and agreed with the findings in the report. No results found.    Orders Placed This Encounter  Procedures   CBC with Differential ([place] Only)    Standing Status:   Future    Standing Expiration Date:   07/20/2023   CMP ([place] only)    Standing Status:   Future    Standing Expiration Date:   07/20/2023   CBC with Differential ([place] Only)    Standing Status:   Future    Standing Expiration Date:   08/03/2023   CMP ([place] only)    Standing Status:   Future    Standing Expiration Date:   08/03/2023   CBC with Differential ([place] Only)    Standing Status:   Future    Standing Expiration Date:   08/17/2023   CMP ([place] only)    Standing Status:   Future  Standing Expiration Date:   08/17/2023   CBC with Differential ([facility] Only)    Standing Status:   Future    Standing Expiration Date:   08/31/2023   CMP ([place] only)    Standing Status:   Future    Standing Expiration Date:   08/31/2023   All questions were answered. The patient knows to call the clinic with any problems, questions or concerns. No barriers to learning was detected. The total time spent in the appointment was 30 minutes.     Truitt Merle, MD 07/07/2022   Felicity Coyer am acting as scribe for Truitt Merle, MD.   I have reviewed the above documentation for accuracy and completeness, and I agree with the above.

## 2022-07-07 NOTE — Progress Notes (Signed)
START ON PATHWAY REGIMEN - Pancreatic Adenocarcinoma     A cycle is every 14 days:     Irinotecan      Oxaliplatin      Leucovorin      Fluorouracil   **Always confirm dose/schedule in your pharmacy ordering system**  Patient Characteristics: Preoperative, M0 (Clinical Staging), Resectable, Neoadjuvant Therapy Planned, BRCA1/2 and PALB2 Mutation Absent/Unknown Therapeutic Status: Preoperative, M0 (Clinical Staging) AJCC T Category: cT1 AJCC N Category: cN0 Resectability Status: Resectable AJCC M Category: cM0 AJCC 8 Stage Grouping: IA BRCA1/2 Mutation Status: Awaiting Test Results PALB2 Mutation Status: Awaiting Test Results Intent of Therapy: Curative Intent, Discussed with Patient

## 2022-07-08 ENCOUNTER — Telehealth: Payer: Self-pay | Admitting: Hematology

## 2022-07-08 NOTE — Telephone Encounter (Signed)
Contacted patient to scheduled appointments. Patient is aware of appointments that are scheduled.   

## 2022-07-13 NOTE — Progress Notes (Signed)
Pharmacist Chemotherapy Monitoring - Initial Assessment    Anticipated start date: 07/20/2022   The following has been reviewed per standard work regarding the patient's treatment regimen: The patient's diagnosis, treatment plan and drug doses, and organ/hematologic function Lab orders and baseline tests specific to treatment regimen  The treatment plan start date, drug sequencing, and pre-medications Prior authorization status  Patient's documented medication list, including drug-drug interaction screen and prescriptions for anti-emetics and supportive care specific to the treatment regimen The drug concentrations, fluid compatibility, administration routes, and timing of the medications to be used The patient's access for treatment and lifetime cumulative dose history, if applicable  The patient's medication allergies and previous infusion related reactions, if applicable   Changes made to treatment plan:  N/A  Follow up needed:  N/A   Michelle Williams, Alex, 07/13/2022  12:11 PM

## 2022-07-14 ENCOUNTER — Inpatient Hospital Stay: Payer: 59 | Admitting: Hematology

## 2022-07-14 ENCOUNTER — Inpatient Hospital Stay: Payer: 59

## 2022-07-15 ENCOUNTER — Other Ambulatory Visit: Payer: Self-pay | Admitting: Student

## 2022-07-15 ENCOUNTER — Inpatient Hospital Stay: Payer: 59

## 2022-07-15 ENCOUNTER — Other Ambulatory Visit: Payer: Self-pay | Admitting: Radiology

## 2022-07-15 ENCOUNTER — Other Ambulatory Visit: Payer: Self-pay

## 2022-07-15 DIAGNOSIS — Z79899 Other long term (current) drug therapy: Secondary | ICD-10-CM | POA: Diagnosis not present

## 2022-07-15 DIAGNOSIS — C250 Malignant neoplasm of head of pancreas: Secondary | ICD-10-CM

## 2022-07-15 DIAGNOSIS — K861 Other chronic pancreatitis: Secondary | ICD-10-CM

## 2022-07-15 LAB — CMP (CANCER CENTER ONLY)
ALT: 26 U/L (ref 0–44)
AST: 27 U/L (ref 15–41)
Albumin: 3.7 g/dL (ref 3.5–5.0)
Alkaline Phosphatase: 274 U/L — ABNORMAL HIGH (ref 38–126)
Anion gap: 10 (ref 5–15)
BUN: 55 mg/dL — ABNORMAL HIGH (ref 6–20)
CO2: 31 mmol/L (ref 22–32)
Calcium: 9.4 mg/dL (ref 8.9–10.3)
Chloride: 79 mmol/L — ABNORMAL LOW (ref 98–111)
Creatinine: 1.34 mg/dL — ABNORMAL HIGH (ref 0.44–1.00)
GFR, Estimated: 45 mL/min — ABNORMAL LOW (ref >60)
Glucose, Bld: 137 mg/dL — ABNORMAL HIGH (ref 70–99)
Potassium: 3.5 mmol/L (ref 3.5–5.1)
Sodium: 120 mmol/L — ABNORMAL LOW (ref 135–145)
Total Bilirubin: 1.7 mg/dL — ABNORMAL HIGH (ref 0.3–1.2)
Total Protein: 8.7 g/dL — ABNORMAL HIGH (ref 6.5–8.1)

## 2022-07-15 LAB — CBC WITH DIFFERENTIAL (CANCER CENTER ONLY)
Abs Immature Granulocytes: 0.1 10*3/uL — ABNORMAL HIGH (ref 0.00–0.07)
Basophils Absolute: 0.1 10*3/uL (ref 0.0–0.1)
Basophils Relative: 0 %
Eosinophils Absolute: 0 10*3/uL (ref 0.0–0.5)
Eosinophils Relative: 0 %
HCT: 32.5 % — ABNORMAL LOW (ref 36.0–46.0)
Hemoglobin: 11.6 g/dL — ABNORMAL LOW (ref 12.0–15.0)
Immature Granulocytes: 1 %
Lymphocytes Relative: 9 %
Lymphs Abs: 1.3 10*3/uL (ref 0.7–4.0)
MCH: 29.8 pg (ref 26.0–34.0)
MCHC: 35.7 g/dL (ref 30.0–36.0)
MCV: 83.5 fL (ref 80.0–100.0)
Monocytes Absolute: 1 10*3/uL (ref 0.1–1.0)
Monocytes Relative: 7 %
Neutro Abs: 11.2 10*3/uL — ABNORMAL HIGH (ref 1.7–7.7)
Neutrophils Relative %: 83 %
Platelet Count: 474 10*3/uL — ABNORMAL HIGH (ref 150–400)
RBC: 3.89 MIL/uL (ref 3.87–5.11)
RDW: 14.6 % (ref 11.5–15.5)
WBC Count: 13.6 10*3/uL — ABNORMAL HIGH (ref 4.0–10.5)
nRBC: 0 % (ref 0.0–0.2)

## 2022-07-15 LAB — MAGNESIUM: Magnesium: 2.8 mg/dL — ABNORMAL HIGH (ref 1.7–2.4)

## 2022-07-15 LAB — LIPASE, BLOOD: Lipase: 66 U/L — ABNORMAL HIGH (ref 11–51)

## 2022-07-16 LAB — CANCER ANTIGEN 19-9: CA 19-9: 20 U/mL (ref 0–35)

## 2022-07-18 ENCOUNTER — Ambulatory Visit (HOSPITAL_COMMUNITY): Payer: 59

## 2022-07-18 ENCOUNTER — Inpatient Hospital Stay (HOSPITAL_COMMUNITY)
Admission: RE | Admit: 2022-07-18 | Discharge: 2022-07-18 | Disposition: A | Discharge status: Home / Self Care | Payer: 59 | Source: Ambulatory Visit | Attending: Hematology | Admitting: Hematology

## 2022-07-18 ENCOUNTER — Other Ambulatory Visit: Payer: Self-pay | Admitting: Student

## 2022-07-18 ENCOUNTER — Other Ambulatory Visit (HOSPITAL_COMMUNITY): Payer: 59

## 2022-07-18 DIAGNOSIS — K831 Obstruction of bile duct: Secondary | ICD-10-CM

## 2022-07-18 NOTE — Progress Notes (Signed)
I spoke with Ms Cushman.  She states she is in [place] with her sister and has an appt with there.  She requested that I cancel all of her appts.  I asked if she needed a referral or if there was anything else I could do.  She ended the call with out comment. Dr Burr Medico notified.

## 2022-07-20 ENCOUNTER — Inpatient Hospital Stay: Payer: 59

## 2022-07-20 ENCOUNTER — Inpatient Hospital Stay: Payer: 59 | Admitting: Hematology

## 2022-07-21 ENCOUNTER — Ambulatory Visit: Payer: 59

## 2022-07-22 ENCOUNTER — Inpatient Hospital Stay: Payer: 59

## 2022-08-02 ENCOUNTER — Other Ambulatory Visit: Payer: Self-pay

## 2022-08-03 ENCOUNTER — Other Ambulatory Visit: Payer: 59

## 2022-08-03 ENCOUNTER — Ambulatory Visit: Payer: 59

## 2022-08-03 ENCOUNTER — Ambulatory Visit: Payer: 59 | Admitting: Nurse Practitioner

## 2022-08-04 ENCOUNTER — Other Ambulatory Visit: Payer: Self-pay

## 2022-08-06 ENCOUNTER — Other Ambulatory Visit: Payer: Self-pay

## 2022-08-08 ENCOUNTER — Ambulatory Visit: Payer: 59

## 2022-08-09 ENCOUNTER — Encounter: Payer: 59 | Admitting: Genetic Counselor

## 2022-08-09 ENCOUNTER — Other Ambulatory Visit: Payer: 59

## 2022-08-11 ENCOUNTER — Ambulatory Visit (HOSPITAL_COMMUNITY)
Admission: EM | Admit: 2022-08-11 | Discharge: 2022-08-11 | Disposition: A | Discharge status: Home / Self Care | Payer: 59

## 2022-08-12 ENCOUNTER — Other Ambulatory Visit: Payer: Self-pay

## 2022-08-12 ENCOUNTER — Emergency Department (HOSPITAL_COMMUNITY)
Admission: EM | Admit: 2022-08-12 | Discharge: 2022-08-12 | Disposition: A | Discharge status: Home / Self Care | Payer: 59 | Attending: Emergency Medicine | Admitting: Emergency Medicine

## 2022-08-12 ENCOUNTER — Encounter (HOSPITAL_COMMUNITY): Payer: Self-pay | Admitting: Emergency Medicine

## 2022-08-12 DIAGNOSIS — Z8507 Personal history of malignant neoplasm of pancreas: Secondary | ICD-10-CM | POA: Insufficient documentation

## 2022-08-12 DIAGNOSIS — Z4800 Encounter for change or removal of nonsurgical wound dressing: Secondary | ICD-10-CM | POA: Insufficient documentation

## 2022-08-12 NOTE — Progress Notes (Signed)
IV team consulted for PICC dressing change. Upon arrival, NT informed IV team RN that patient had left the hospital.

## 2022-08-12 NOTE — Discharge Planning (Signed)
TOC consulted regarding home health for PICC line for dressing change.  Pt denied by several home health agencies as PICC line dressing change is not deemed a "skillable need."

## 2022-08-12 NOTE — ED Notes (Signed)
Pt left  without getting dressing change, MD was made aware.

## 2022-08-12 NOTE — Discharge Instructions (Addendum)
You were seen in the emergency department for your dressing changes.  We will able to change the dressing for you in the emergency department however you can follow-up with your primary doctor or your oncologist to make sure that you are eating appropriate outpatient dressing changes in the future.  You should return to the emergency department if you are having fevers, redness spreading from your PICC line or biliary drain site, pus draining from those sites, your drains come out or if you have any other new or concerning symptoms.

## 2022-08-12 NOTE — ED Provider Notes (Signed)
[place] EMERGENCY DEPARTMENT AT [facility] Provider Note   CSN: [id number] Arrival date & time: 08/12/22  4098     History  No chief complaint on file.   Michelle Williams is a 62 y.o. female.  Patient is a 62 year old female with a past medical history of pancreatic cancer with PICC line and biliary drain in place presenting to the emergency department requesting dressing changes.  The patient had biliary drain placed for an intra-abdominal abscess shortly after having her diagnosis of her cancer.  The drain was replaced 3/27 as it was inadequately draining.  She was also had a PICC line placed that day and was sent home on IV antibiotics for a total of 33 days.  She states that she recently moved here and was not established with care for where to have her dressings changed.  She states that she was sent home with the dressing material but was never instructed on how to change her dressings.  She states she was told she needed her dressings changed every 10 days.  She denies any drainage, erythema or warmth from her dressings and denies any fevers or acute complaints.  The history is provided by the patient.       Home Medications Prior to Admission medications   Medication Sig Start Date End Date Taking? Authorizing Provider  albuterol (VENTOLIN HFA) 108 (90 Base) MCG/ACT inhaler Inhale 2 puffs into the lungs every 6 (six) hours as needed for wheezing or shortness of breath. 02/25/22   [provider]  cyclobenzaprine (FLEXERIL) 10 MG tablet Take 10 mg by mouth at bedtime as needed for muscle spasms. 12/12/16   [provider]  fluticasone (FLONASE) 50 MCG/ACT nasal spray Place 1 spray into both nostrils daily. Patient taking differently: Place 1 spray into both nostrils daily as needed for allergies. 02/19/22   Derwood Kaplan, MD  levocetirizine (XYZAL) 5 MG tablet Take 5 mg by mouth at bedtime as needed for allergies.    [provider]   lidocaine-prilocaine (EMLA) cream Apply to affected area once 07/07/22   Malachy Mood, MD  magnesium oxide (MAG-OX) 400 (240 Mg) MG tablet Take 1 tablet (400 mg total) by mouth 2 (two) times daily. 06/17/22 09/15/22  Heilingoetter, Cassandra L, PA-C  montelukast (SINGULAIR) 10 MG tablet Take 10 mg by mouth at bedtime.    [provider]  ondansetron (ZOFRAN) 8 MG tablet Take 1 tablet (8 mg total) by mouth every 8 (eight) hours as needed for nausea or vomiting. 06/17/22   Heilingoetter, Cassandra L, PA-C  ondansetron (ZOFRAN) 8 MG tablet Take 1 tablet (8 mg total) by mouth every 8 (eight) hours as needed for nausea or vomiting. Start on the third day after cisplatin 07/07/22   Malachy Mood, MD  potassium chloride SA (KLOR-CON M) 20 MEQ tablet Take 1 tablet (20 mEq total) by mouth daily for 5 days. 06/13/22 06/18/22  Pokhrel, Rebekah Chesterfield, MD  prochlorperazine (COMPAZINE) 10 MG tablet Take 1 tablet (10 mg total) by mouth every 6 (six) hours as needed for nausea or vomiting (Nausea or vomiting). 07/07/22   Malachy Mood, MD  traZODone (DESYREL) 100 MG tablet Take 100 mg by mouth at bedtime as needed for sleep. 03/28/22   [provider]  Varenicline Tartrate, Starter, (CHANTIX STARTING MONTH PAK) 0.5 MG X 11 & 1 MG X 42 TBPK Days 1 to 3: 0.5 mg once daily. Days 4 to 7: 0.5 mg twice daily. Maintenance (day 8 and later): 1  mg twice daily 05/06/22   Corky Crafts, MD      Allergies    Patient has no known allergies.    Review of Systems   Review of Systems  Physical Exam Updated Vital Signs BP (!) 168/104 (BP Location: Left Arm)   Pulse 81   Temp 98.2 F (36.8 C) (Oral)   Resp 16   SpO2 100%  Physical Exam Vitals and nursing note reviewed.  Constitutional:      General: She is not in acute distress.    Appearance: Normal appearance.  HENT:     Head: Normocephalic.  Eyes:     Extraocular Movements: Extraocular movements intact.  Pulmonary:     Effort: Pulmonary effort is normal.   Abdominal:     General: Abdomen is flat.     Palpations: Abdomen is soft.     Tenderness: There is no abdominal tenderness.  Musculoskeletal:        General: Normal range of motion.     Cervical back: Normal range of motion.  Skin:    General: Skin is warm and dry.     Comments: PICC line in place in right upper extremity with no surrounding erythema, warmth or drainage Biliary drain in place in right upper quadrant of abdomen without surrounding erythema, warmth or drainage, dressing starting to peel at the edges  Neurological:     General: No focal deficit present.     Mental Status: She is alert and oriented to person, place, and time.  Psychiatric:        Mood and Affect: Mood normal.        Behavior: Behavior normal.     ED Results / Procedures / Treatments   Labs (all labs ordered are listed, but only abnormal results are displayed) Labs Reviewed - No data to display  EKG None  Radiology No results found.  Procedures Procedures    Medications Ordered in ED Medications - No data to display  ED Course/ Medical Decision Making/ A&P Clinical Course as of 08/12/22 0947  Fri Aug 12, 2022  [address] unable to set up home health for dressing change needs. They will do bedside teaching with the patient and she was recommended to follow up with her primary doctor or oncologist for further dressing change evaluations. [VK]    Clinical Course User Index [VK] Rexford Maus, DO                             Medical Decision Making This patient presents to the ED with chief complaint(s) of dressing change with pertinent past medical history of pancreatic cancer, intraabdominal abscess with biliary drain and PICC line in place which further complicates the presenting complaint. The complaint involves an extensive differential diagnosis and also carries with it a high risk of complications and morbidity.    The differential diagnosis includes no evidence of infection near  drain sites and no signs of sepsis, PICC line and biliary drain appear to be functioning appropriately without signs of occlusion or misplacement   Additional history obtained: Additional history obtained from N/A Records reviewed previous admission documents, Care Everywhere/External Records, and outpatient oncology records  ED Course and Reassessment: Patient is well-appearing on arrival in no acute distress.  She has no signs of infection, obstruction or misplaced PICC line or biliary line.  Will change her dressings for her today and social work will be consulted to help with establishing  outpatient dressing change and follow-up.  Independent labs interpretation:  N/A  Independent visualization of imaging: N/A  Consultation: - Consulted or discussed management/test interpretation w/ external professional: TOC  Consideration for admission or further workup: Patient has no emergent conditions requiring admission or further work-up at this time and is stable for discharge home with primary care follow-up  Social Determinants of health: N/A            Final Clinical Impression(s) / ED Diagnoses Final diagnoses:  Encounter for change of dressing    Rx / DC Orders ED Discharge Orders     None         Rexford Maus, DO 08/12/22 8159

## 2022-08-14 ENCOUNTER — Other Ambulatory Visit: Payer: Self-pay

## 2022-08-15 ENCOUNTER — Other Ambulatory Visit: Payer: Self-pay

## 2022-08-15 ENCOUNTER — Inpatient Hospital Stay: Payer: 59 | Attending: Physician Assistant

## 2022-08-15 ENCOUNTER — Other Ambulatory Visit: Payer: Self-pay | Admitting: *Deleted

## 2022-08-15 DIAGNOSIS — C250 Malignant neoplasm of head of pancreas: Secondary | ICD-10-CM | POA: Insufficient documentation

## 2022-08-15 DIAGNOSIS — D649 Anemia, unspecified: Secondary | ICD-10-CM

## 2022-08-15 DIAGNOSIS — Z79899 Other long term (current) drug therapy: Secondary | ICD-10-CM | POA: Diagnosis not present

## 2022-08-15 DIAGNOSIS — K861 Other chronic pancreatitis: Secondary | ICD-10-CM

## 2022-08-15 DIAGNOSIS — Z800 Family history of malignant neoplasm of digestive organs: Secondary | ICD-10-CM

## 2022-08-15 LAB — CMP (CANCER CENTER ONLY)
ALT: 10 U/L (ref 0–44)
AST: 56 U/L — ABNORMAL HIGH (ref 15–41)
Albumin: 3.4 g/dL — ABNORMAL LOW (ref 3.5–5.0)
Alkaline Phosphatase: 248 U/L — ABNORMAL HIGH (ref 38–126)
Anion gap: 7 (ref 5–15)
BUN: 6 mg/dL (ref 6–20)
CO2: 27 mmol/L (ref 22–32)
Calcium: 9.1 mg/dL (ref 8.9–10.3)
Chloride: 103 mmol/L (ref 98–111)
Creatinine: 0.44 mg/dL (ref 0.44–1.00)
GFR, Estimated: >60 mL/min (ref >60)
Glucose, Bld: 102 mg/dL — ABNORMAL HIGH (ref 70–99)
Potassium: 2.9 mmol/L — ABNORMAL LOW (ref 3.5–5.1)
Sodium: 137 mmol/L (ref 135–145)
Total Bilirubin: 0.7 mg/dL (ref 0.3–1.2)
Total Protein: 7.3 g/dL (ref 6.5–8.1)

## 2022-08-15 LAB — FOLATE: Folate: 8.6 ng/mL (ref >5.9)

## 2022-08-15 LAB — CBC WITH DIFFERENTIAL (CANCER CENTER ONLY)
Abs Immature Granulocytes: 0.03 10*3/uL (ref 0.00–0.07)
Basophils Absolute: 0 10*3/uL (ref 0.0–0.1)
Basophils Relative: 0 %
Eosinophils Absolute: 0.3 10*3/uL (ref 0.0–0.5)
Eosinophils Relative: 3 %
HCT: 31.4 % — ABNORMAL LOW (ref 36.0–46.0)
Hemoglobin: 10.7 g/dL — ABNORMAL LOW (ref 12.0–15.0)
Immature Granulocytes: 0 %
Lymphocytes Relative: 22 %
Lymphs Abs: 2.3 10*3/uL (ref 0.7–4.0)
MCH: 29.8 pg (ref 26.0–34.0)
MCHC: 34.1 g/dL (ref 30.0–36.0)
MCV: 87.5 fL (ref 80.0–100.0)
Monocytes Absolute: 0.7 10*3/uL (ref 0.1–1.0)
Monocytes Relative: 7 %
Neutro Abs: 7.1 10*3/uL (ref 1.7–7.7)
Neutrophils Relative %: 68 %
Platelet Count: 408 10*3/uL — ABNORMAL HIGH (ref 150–400)
RBC: 3.59 MIL/uL — ABNORMAL LOW (ref 3.87–5.11)
RDW: 15.4 % (ref 11.5–15.5)
WBC Count: 10.5 10*3/uL (ref 4.0–10.5)
nRBC: 0 % (ref 0.0–0.2)

## 2022-08-15 LAB — IRON AND IRON BINDING CAPACITY (CC-WL,HP ONLY)
Iron: 38 ug/dL (ref 28–170)
Saturation Ratios: 10 % — ABNORMAL LOW (ref 10.4–31.8)
TIBC: 368 ug/dL (ref 250–450)
UIBC: 330 ug/dL (ref 148–442)

## 2022-08-15 LAB — MAGNESIUM: Magnesium: 1.5 mg/dL — ABNORMAL LOW (ref 1.7–2.4)

## 2022-08-15 LAB — FERRITIN: Ferritin: 38 ng/mL (ref 11–307)

## 2022-08-15 LAB — VITAMIN B12: Vitamin B-12: 332 pg/mL (ref 180–914)

## 2022-08-15 LAB — LIPASE, BLOOD: Lipase: 33 U/L (ref 11–51)

## 2022-08-16 ENCOUNTER — Other Ambulatory Visit: Payer: Self-pay | Admitting: Hematology

## 2022-08-16 ENCOUNTER — Other Ambulatory Visit: Payer: Self-pay

## 2022-08-16 LAB — CANCER ANTIGEN 19-9: CA 19-9: 12 U/mL (ref 0–35)

## 2022-08-16 MED ORDER — POTASSIUM CHLORIDE CRYS ER 20 MEQ PO TBCR: EXTENDED_RELEASE_TABLET | ORAL | 0 refills | Status: DC

## 2022-08-16 NOTE — Progress Notes (Signed)
Michelle Williams, please call in oral KCL bid for 3 days then daily for a month, thanks   [employer] sent to this RN from Dr. Mosetta Putt for prescription for KCL for pt.  Prescription entered and sent to [facility].

## 2022-08-17 ENCOUNTER — Telehealth: Payer: Self-pay | Admitting: Hematology

## 2022-08-17 DIAGNOSIS — K659 Peritonitis, unspecified: Secondary | ICD-10-CM | POA: Insufficient documentation

## 2022-08-17 NOTE — Telephone Encounter (Signed)
90 Day supply is not need for now.  Will reconsider after 30 days if pt's K+ is still low.

## 2022-08-17 NOTE — Telephone Encounter (Signed)
Contacted patient to scheduled appointments. Patient is aware of appointments that are scheduled.   

## 2022-08-17 NOTE — Assessment & Plan Note (Deleted)
-  Pt was hospitalized in [place] on 07/22/2022 for Sepsis 2/2 possible 2/2 ascending cholangitis/possible pancreatic abscess - s/p biliary drain and ERCP failed stent placement at her initial cancer diagnosis  - CT abdomen; A large heterogeneous multicystic lesion approximately 8 x 8 x 5 cm extending from the pancreatic uncinate process inferiorly with inflammatory stranding, -biliary drain exchanged 3/22 and capped 3/23 -3/27 MRCP: results noted, contiguous 7 cm lesion contiguous with pancreatic head, imaging review by surgery favors necrosis, low suspicion for abscess or malignancy spread -BC +ve MSSA, - TTE veg noted -ZOX:WRUEAV valve veg noted - continue vancomycin for total 6 weeks

## 2022-08-17 NOTE — Assessment & Plan Note (Deleted)
-  Stage IA, diagnosed in 05/2022 -Presented with abdominal pain and obstructive jaundice, status post PTC, after failed ERCP -She was hospitalized for AKI shortly after her diagnosis, recovered completely. -Due to the concern of pancreatitis, Dr. Freida Busman does not recommend upfront surgery, we recommend neoadjuvant chemotherapy. -Pt moved to [place] in early March and she has not received any cancer treatment.

## 2022-08-17 NOTE — Progress Notes (Deleted)
[facility]   [phone] [phone]   Clinic Follow up Note   Patient Care Team: Hillery Aldo, NP as PCP - General Corky Crafts, MD as PCP - Cardiology (Cardiology) Malachy Mood, MD as Consulting Physician (Hematology)  Date of Service:  08/17/2022  CHIEF COMPLAINT: f/u of pancreatic cancer    CURRENT THERAPY:  FOLFIRI   ASSESSMENT: *** Michelle Williams is a 62 y.o. female with   No problem-specific Assessment & Plan notes found for this encounter.  ***   PLAN:    SUMMARY OF ONCOLOGIC HISTORY: Oncology History Overview Note   Cancer Staging  Pancreatic cancer [facility]) Staging form: Exocrine Pancreas, AJCC 8th Edition - Clinical stage from 05/27/2022: Stage IA (cT1c, cN0, cM0) - Signed by Malachy Mood, MD on 06/10/2022 Total positive nodes: 0     Pancreatic cancer  05/17/2022 Imaging   US Abdomen Limited RUQ   IMPRESSION: 1. Cholelithiasis without secondary signs of acute cholecystitis. 2. Common bile duct is mildly prominent at 7.6 mm. Recommend correlation with LFTs. In the setting of abnormal LFTs, recommend correlation with MRI/MRCP.   05/20/2022 Imaging   US Abdomen Limited RUQ   IMPRESSION: 1. Cholelithiasis without additional changes to suggest acute cholecystitis. 2. Dilated common bile duct at 13 mm, increasing since previous study. Consider MRCP to exclude common duct stone or obstruction.     05/21/2022 Imaging   CT ABDOMEN PELVIS W CONTRAST   IMPRESSION: 1. Dilatation of the proximal common bile duct measures up to 1.3 cm. Abrupt termination the dilated common bile duct at the level of the head of pancreas. There is also main duct dilatation which abruptly terminates at the level of the head of pancreas, proximal to the ampulla. Imaging findings are concerning for underlying obstructing lesion within the head of pancreas. Further evaluation with contrast enhanced MRI/MRCP is advised. 2. Stones noted on  recent gallbladder ultrasound are not CT visible. There is no gallbladder wall thickening or pericholecystic fluid identified. 3. 2 mm right upper lobe lung nodule. Nonspecific. See lung cancer screening CT from 04/15/2022 for follow-up recommendations. 4.  Aortic Atherosclerosis (ICD10-I70.0).   05/21/2022 Imaging   MR ABDOMEN MRCP WO CONTRAST   IMPRESSION: 1. Exam detail is diminished. Patient aborted exam prior to completion of study and prior to administration of intravenous contrast material. Additionally, there is motion artifact which further diminishes diagnostic assessment of the area of concern within the head of pancreas. 2. Marked intrahepatic and common bile duct dilatation. Abrupt termination of the common bile duct within the head of pancreas is noted. There is diffuse main duct dilatation up to the level of the head of pancreas which measures 6 mm in maximum diameter. The dilated main duct terminates at the same level as the termination of the CBD. Here, there is a subtle signal abnormality measuring approximately 1.9 x 0.7 cm. Imaging findings are concerning for underlying neoplasm within the head of pancreas. Advise further evaluation with ERCP and endoscopic ultrasound.      05/23/2022 Procedure   ERCP  -Normal appearing major papilla -Unable to cannulate either duct   05/25/2022 Procedure   IR BILIARY DRAIN PLACEMENT WITH CHOLANGIOGRAM    05/27/2022 Procedure   IR INT EXT BILIARY DRAIN WITH CHOLANGIOGRAM    05/27/2022 Procedure   IR ENDOLUMINAL BX OF BILIARY TREE    05/27/2022 Cancer Staging   Staging form: Exocrine Pancreas, AJCC 8th Edition - Clinical stage from 05/27/2022: Stage IA (cT1c, cN0, cM0) - Signed  by Malachy Mood, MD on 06/10/2022 Total positive nodes: 0   06/10/2022 Initial Diagnosis   Pancreatic cancer [facility])    Tumor Marker   Patient's tumor was tested for the following markers: CA 19.9. Results of the tumor marker test revealed 158.     Genetic Testing   [facility] CancerNext-Expanded Panel+RNA was Negative. Report date is 06/28/2022.  The CancerNext-Expanded gene panel offered by [facility] and includes sequencing, rearrangement, and RNA analysis for the following 77 genes: AIP, ALK, APC, ATM, AXIN2, BAP1, BARD1, BLM, BMPR1A, BRCA1, BRCA2, BRIP1, CDC73, CDH1, CDK4, CDKN1B, CDKN2A, CHEK2, CTNNA1, DICER1, FANCC, FH, FLCN, GALNT12, KIF1B, LZTR1, MAX, MEN1, MET, MLH1, MSH2, MSH3, MSH6, MUTYH, NBN, NF1, NF2, NTHL1, PALB2, PHOX2B, PMS2, POT1, PRKAR1A, PTCH1, PTEN, RAD51C, RAD51D, RB1, RECQL, RET, SDHA, SDHAF2, SDHB, SDHC, SDHD, SMAD4, SMARCA4, SMARCB1, SMARCE1, STK11, SUFU, TMEM127, TP53, TSC1, TSC2, VHL and XRCC2 (sequencing and deletion/duplication); EGFR, EGLN1, HOXB13, KIT, MITF, PDGFRA, POLD1, and POLE (sequencing only); EPCAM and GREM1 (deletion/duplication only).     06/30/2022 Imaging    IMPRESSION: Once again enlarged complex ill-defined fluid collection involving the region of the head of the pancreas and in the adjacent mesentery similar to previous. Please correlate for a known history including sequela of pancreatitis with possible phlegmon. Acute peripancreatic fluid collection   Stable severe dilatation of the pancreatic duct with atrophy of the pancreas and abrupt change of the pancreatic head. Please correlate for known history of pancreatic adenocarcinoma.   Indwelling PTC catheter.   Slightly heterogeneous enhancement of the kidneys. No stranding. Please correlate for renal function signs of UTI or renal infection. Recommend follow-up   07/20/2022 -  Chemotherapy   Patient is on Treatment Plan : PANCREAS Modified FOLFIRINOX q14d x 4 cycles        INTERVAL HISTORY: *** Michelle Williams is here for a follow up of pancreatic cancer . She was last seen by me on 07/07/2022. She presents to the clinic    All other systems were reviewed with the patient and are negative.  MEDICAL HISTORY:  Past Medical History:   Diagnosis Date   Acute bronchitis    Allergy    Anemia    Anxiety    Arthritis    Cataract    BEGINIING   Cervical cancer    Degenerative joint disease    UPPER,LOWER BACK   Gallstones    H/O degenerative disc disease    H/O rheumatoid arthritis    Hypertension    Neuropathy    BILATERAL    SURGICAL HISTORY: Past Surgical History:  Procedure Laterality Date   BREAST EXCISIONAL BIOPSY Right    COLONOSCOPY     2018   ERCP N/A 05/23/2022   Procedure: ENDOSCOPIC RETROGRADE CHOLANGIOPANCREATOGRAPHY (ERCP);  Surgeon: Meryl Dare, MD;  Location: [facility] ENDOSCOPY;  Service: Gastroenterology;  Laterality: N/A;   IR BILIARY DRAIN PLACEMENT WITH CHOLANGIOGRAM  05/25/2022   IR ENDOLUMINAL BX OF BILIARY TREE  05/30/2022   IR FALLOPIAN TUBE CATHETERIZATION     IR INT EXT BILIARY DRAIN WITH CHOLANGIOGRAM  05/27/2022   POLYPECTOMY      I have reviewed the social history and family history with the patient and they are unchanged from previous note.  ALLERGIES:  has No Known Allergies.  MEDICATIONS:  Current Outpatient Medications  Medication Sig Dispense Refill   albuterol (VENTOLIN HFA) 108 (90 Base) MCG/ACT inhaler Inhale 2 puffs into the lungs every 6 (six) hours as needed for wheezing or shortness of breath.  cyclobenzaprine (FLEXERIL) 10 MG tablet Take 10 mg by mouth at bedtime as needed for muscle spasms.  0   fluticasone (FLONASE) 50 MCG/ACT nasal spray Place 1 spray into both nostrils daily. (Patient taking differently: Place 1 spray into both nostrils daily as needed for allergies.) 9.9 mL 2   levocetirizine (XYZAL) 5 MG tablet Take 5 mg by mouth at bedtime as needed for allergies.     lidocaine-prilocaine (EMLA) cream Apply to affected area once 30 g 3   magnesium oxide (MAG-OX) 400 (240 Mg) MG tablet Take 1 tablet (400 mg total) by mouth 2 (two) times daily. 90 tablet 1   montelukast (SINGULAIR) 10 MG tablet Take 10 mg by mouth at bedtime.     ondansetron (ZOFRAN) 8 MG  tablet Take 1 tablet (8 mg total) by mouth every 8 (eight) hours as needed for nausea or vomiting. 30 tablet 2   ondansetron (ZOFRAN) 8 MG tablet Take 1 tablet (8 mg total) by mouth every 8 (eight) hours as needed for nausea or vomiting. Start on the third day after cisplatin 30 tablet 1   potassium chloride SA (KLOR-CON M) 20 MEQ tablet Take 1 tablet (20 mEq total) by mouth 3 (three) times daily AND 1 tablet (20 mEq total) daily. Take 1 tablet ( ) 3 times daily for 3 days.  Then start taking 1 tablet ( ) daily for remainder of month.. 36 tablet 0   prochlorperazine (COMPAZINE) 10 MG tablet Take 1 tablet (10 mg total) by mouth every 6 (six) hours as needed for nausea or vomiting (Nausea or vomiting). 30 tablet 1   traZODone (DESYREL) 100 MG tablet Take 100 mg by mouth at bedtime as needed for sleep.     Varenicline Tartrate, Starter, (CHANTIX STARTING MONTH PAK) 0.5 MG X 11 & 1 MG X 42 TBPK Days 1 to 3: 0.5 mg once daily. Days 4 to 7: 0.5 mg twice daily. Maintenance (day 8 and later): 1 mg twice daily 1 each 0   No current facility-administered medications for this visit.    PHYSICAL EXAMINATION: ECOG PERFORMANCE STATUS: {CHL ONC ECOG [phone]}  There were no vitals filed for this visit. Wt Readings from Last 3 Encounters:  06/17/22 150 lb 11.2 oz (68.4 kg)  06/13/22 153 lb 14.1 oz (69.8 kg)  06/10/22 144 lb 14.4 oz (65.7 kg)    {Only keep what was examined. If exam not performed, can use .CEXAM } GENERAL:alert, no distress and comfortable SKIN: skin color, texture, turgor are normal, no rashes or significant lesions EYES: normal, Conjunctiva are pink and non-injected, sclera clear {OROPHARYNX:no exudate, no erythema and lips, buccal mucosa, and tongue normal}  NECK: supple, thyroid normal size, non-tender, without nodularity LYMPH:  no palpable lymphadenopathy in the cervical, axillary {or inguinal} LUNGS: clear to auscultation and percussion with normal breathing  effort HEART: regular rate & rhythm and no murmurs and no lower extremity edema ABDOMEN:abdomen soft, non-tender and normal bowel sounds Musculoskeletal:no cyanosis of digits and no clubbing  NEURO: alert & oriented x 3 with fluent speech, no focal motor/sensory deficits  LABORATORY DATA:  I have reviewed the data as listed    Latest Ref Rng & Units 08/15/2022   12:37 PM 07/15/2022    1:12 PM 06/17/2022    9:04 AM  CBC  WBC 4.0 - 10.5 K/uL 10.5  13.6  9.4   Hemoglobin 12.0 - 15.0 g/dL 16.1  09.6  8.5   Hematocrit 36.0 - 46.0 % 31.4  32.5  24.9  Platelets 150 - 400 K/uL 408  474  501         Latest Ref Rng & Units 08/15/2022   12:37 PM 07/15/2022    1:12 PM 06/17/2022    9:04 AM  CMP  Glucose 70 - 99 mg/dL 295  621  91   BUN 6 - 20 mg/dL 6  55  7   Creatinine 3.08 - 1.00 mg/dL 6.57  8.46  9.62   Sodium 135 - 145 mmol/L 137  120  136   Potassium 3.5 - 5.1 mmol/L 2.9  3.5  4.0   Chloride 98 - 111 mmol/L 103  79  107   CO2 22 - 32 mmol/L 27  31  22    Calcium 8.9 - 10.3 mg/dL 9.1  9.4  7.8   Total Protein 6.5 - 8.1 g/dL 7.3  8.7  6.7   Total Bilirubin 0.3 - 1.2 mg/dL 0.7  1.7  3.1   Alkaline Phos 38 - 126 U/L 248  274  274   AST 15 - 41 U/L 56  27  38   ALT 0 - 44 U/L 10  26  47       RADIOGRAPHIC STUDIES: I have personally reviewed the radiological images as listed and agreed with the findings in the report. No results found.    No orders of the defined types were placed in this encounter.  All questions were answered. The patient knows to call the clinic with any problems, questions or concerns. No barriers to learning was detected. The total time spent in the appointment was {CHL ONC TIME VISIT - [id number]}.     Salome Holmes, CMA 08/17/2022   I, Monica Martinez, CMA, am acting as scribe for Malachy Mood, MD.   {Add scribe attestation statement}

## 2022-08-18 ENCOUNTER — Other Ambulatory Visit: Payer: Self-pay

## 2022-08-18 ENCOUNTER — Inpatient Hospital Stay: Payer: 59

## 2022-08-18 ENCOUNTER — Inpatient Hospital Stay: Payer: 59 | Admitting: Hematology

## 2022-08-18 DIAGNOSIS — C250 Malignant neoplasm of head of pancreas: Secondary | ICD-10-CM

## 2022-08-18 DIAGNOSIS — K659 Peritonitis, unspecified: Secondary | ICD-10-CM

## 2022-08-22 ENCOUNTER — Inpatient Hospital Stay: Payer: 59

## 2022-08-22 ENCOUNTER — Other Ambulatory Visit: Payer: Self-pay

## 2022-08-23 ENCOUNTER — Other Ambulatory Visit: Payer: Self-pay

## 2022-08-26 ENCOUNTER — Ambulatory Visit (HOSPITAL_COMMUNITY)
Admission: RE | Admit: 2022-08-26 | Discharge: 2022-08-26 | Disposition: A | Discharge status: Home / Self Care | Payer: 59 | Source: Ambulatory Visit | Attending: Hematology | Admitting: Hematology

## 2022-08-26 ENCOUNTER — Other Ambulatory Visit: Payer: Self-pay

## 2022-08-26 DIAGNOSIS — C250 Malignant neoplasm of head of pancreas: Secondary | ICD-10-CM | POA: Insufficient documentation

## 2022-08-26 DIAGNOSIS — Z800 Family history of malignant neoplasm of digestive organs: Secondary | ICD-10-CM | POA: Insufficient documentation

## 2022-08-28 NOTE — Assessment & Plan Note (Signed)
-  Stage IA, diagnosed in 05/2022 -Presented with abdominal pain and obstructive jaundice, status post PTC, after failed ERCP -She was hospitalized for AKI shortly after her diagnosis, recovered completely. -She was seen by pancreatic surgeon Dr. Freida Busman.  We recommended neoadjuvant chemotherapy, she subsequently moved to [place] before she started treatment. -She was hospitalized in [place] on 07/19/2022 for Sepsis from possible ascending cholangitis/possible pancreatic abscess, MSSA bacteremia,

## 2022-08-29 ENCOUNTER — Telehealth: Payer: Self-pay | Admitting: Hematology

## 2022-08-29 ENCOUNTER — Inpatient Hospital Stay (HOSPITAL_BASED_OUTPATIENT_CLINIC_OR_DEPARTMENT_OTHER): Payer: 59 | Admitting: Hematology

## 2022-08-29 ENCOUNTER — Inpatient Hospital Stay: Payer: 59

## 2022-08-29 DIAGNOSIS — C250 Malignant neoplasm of head of pancreas: Secondary | ICD-10-CM

## 2022-08-29 NOTE — Telephone Encounter (Signed)
Patient left voicemail to reschedule her PICC flush, reached out to patient to reschedule.Patient aware of time and date of appointment

## 2022-08-29 NOTE — Progress Notes (Signed)
No show again today (no show last week)  Malachy Mood MD 08/29/2022

## 2022-08-30 ENCOUNTER — Other Ambulatory Visit: Payer: Self-pay

## 2022-08-30 ENCOUNTER — Telehealth: Payer: Self-pay

## 2022-08-30 ENCOUNTER — Inpatient Hospital Stay: Payer: 59

## 2022-08-30 DIAGNOSIS — Z95828 Presence of other vascular implants and grafts: Secondary | ICD-10-CM | POA: Insufficient documentation

## 2022-08-30 MED ORDER — SODIUM CHLORIDE 0.9% FLUSH: 10.0000 mL | Freq: Once | INTRAVENOUS | Status: DC

## 2022-08-30 MED ORDER — HEPARIN SOD (PORK) LOCK FLUSH 100 UNIT/ML IV SOLN: 500.0000 [IU] | Freq: Once | INTRAVENOUS | Status: DC

## 2022-08-30 NOTE — Telephone Encounter (Signed)
Spoke with Pam in the Infectious Disease Dept at [facility] [phone]) regarding this pt.  Spoke with Elita Quick regarding pt's home health/infusion nurse services.  Pam stated that home health was setup for this pt at the time of d/c from their hospital.  Pam stated that the home health was setup in the state of [place] where the pt resides.  Stated that pt has come to [facility] which she's been in [place] for at least 3 weeks d/t pt coming to [facility] for labs and PICC care.  Informed Pam that pt has not had a PICC dressing change for 2 wks now nor labs but came to [facility] to have these services performed. Pam stated she will reach out to the pt to get this matter resolved.  Pam also stated that the pt should have completed her IV Abx on yesterday 08/29/2022.    Pt has been notified by this RN and by Sherral Hammers that she must be under the care of a [facility] provider to be seen here in the Cancer Center.  Pt was also told previously that she must be seen by a provider (Dr. Mosetta Putt) and complete a CXR to confirm PICC placement.  Pt verbalized understanding each time but continues to "NO SHOW" her office visit appts with Dr. Mosetta Putt.  Pt completed the CXR on 08/26/2022.  Pt came to Cancer Center today 08/30/2022 to get labs and PICC dressing but did not come to Dr. Latanya Maudlin appt on yesterday 08/29/2022.  Pt called and spoke to [facility] Jacqualine Code to schedule a PICC Dressing change appt with lab draw on 08/29/2022 knowing she had a doctor's appt with Dr. Mosetta Putt on 08/29/2022.  Informed pt once again, in order to receive services (labs and etc.) within the [facility], the pt will need to be under the care of one of the [facility]ologist (at any location).  Pt stated she need her PICC dressing change done because it has not been done in 2 wks.  Stated again the Cancer Center policy regarding labs and PICC care.  Pt was irritated and walked out  mad.  Notified Sherral Hammers regarding situation with pt.  Katie instructed this RN to contact [facility] to inform them of the pt's lack of home health to maintain the pt's PICC Care.

## 2022-09-05 ENCOUNTER — Inpatient Hospital Stay: Payer: 59

## 2022-09-23 ENCOUNTER — Other Ambulatory Visit: Payer: Self-pay

## 2022-10-20 ENCOUNTER — Other Ambulatory Visit: Payer: Self-pay

## 2023-03-17 ENCOUNTER — Other Ambulatory Visit: Payer: Self-pay | Admitting: Interventional Cardiology

## 2023-03-19 ENCOUNTER — Other Ambulatory Visit: Payer: Self-pay

## 2023-04-02 DEATH — deceased

## 2023-09-13 ENCOUNTER — Other Ambulatory Visit: Payer: Self-pay
